# Patient Record
Sex: Male | Born: 1985 | Race: Black or African American | Hispanic: No | Marital: Single | Smoking: Former smoker
Health system: Southern US, Community
[De-identification: ages and names within clinical notes are randomized; demographics above are authoritative.]

## PROBLEM LIST (undated history)

## (undated) DIAGNOSIS — R569 Unspecified convulsions: Secondary | ICD-10-CM

## (undated) DIAGNOSIS — K222 Esophageal obstruction: Principal | ICD-10-CM

## (undated) DIAGNOSIS — Z5189 Encounter for other specified aftercare: Secondary | ICD-10-CM

## (undated) DIAGNOSIS — K219 Gastro-esophageal reflux disease without esophagitis: Secondary | ICD-10-CM

## (undated) DIAGNOSIS — F32A Depression, unspecified: Secondary | ICD-10-CM

## (undated) DIAGNOSIS — K220 Achalasia of cardia: Secondary | ICD-10-CM

## (undated) DIAGNOSIS — F329 Major depressive disorder, single episode, unspecified: Secondary | ICD-10-CM

## (undated) DIAGNOSIS — K3184 Gastroparesis: Secondary | ICD-10-CM

## (undated) DIAGNOSIS — D649 Anemia, unspecified: Secondary | ICD-10-CM

## (undated) HISTORY — DX: Encounter for other specified aftercare: Z51.89

## (undated) HISTORY — DX: Anemia, unspecified: D64.9

## (undated) HISTORY — DX: Achalasia of cardia: K22.0

## (undated) HISTORY — DX: Depression, unspecified: F32.A

## (undated) HISTORY — PX: UPPER GASTROINTESTINAL ENDOSCOPY: SHX188

## (undated) HISTORY — PX: HELLER MYOTOMY: SHX5259

## (undated) HISTORY — DX: Major depressive disorder, single episode, unspecified: F32.9

## (undated) HISTORY — DX: Gastro-esophageal reflux disease without esophagitis: K21.9

## (undated) HISTORY — PX: COLONOSCOPY: SHX174

## (undated) HISTORY — DX: Esophageal obstruction: K22.2

## (undated) HISTORY — DX: Unspecified convulsions: R56.9

---
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
## 2004-05-30 ENCOUNTER — Ambulatory Visit: Payer: Self-pay | Admitting: Internal Medicine

## 2006-03-02 ENCOUNTER — Ambulatory Visit: Payer: Self-pay | Admitting: Internal Medicine

## 2006-03-09 ENCOUNTER — Ambulatory Visit: Payer: Self-pay | Admitting: Internal Medicine

## 2006-03-09 HISTORY — PX: PANENDOSCOPY: SHX2159

## 2006-04-06 ENCOUNTER — Ambulatory Visit: Payer: Self-pay | Admitting: Internal Medicine

## 2006-08-20 ENCOUNTER — Ambulatory Visit: Payer: Self-pay | Admitting: Internal Medicine

## 2006-08-31 ENCOUNTER — Ambulatory Visit (HOSPITAL_COMMUNITY): Admission: RE | Admit: 2006-08-31 | Discharge: 2006-08-31 | Payer: Self-pay | Admitting: Internal Medicine

## 2006-08-31 ENCOUNTER — Encounter: Payer: Self-pay | Admitting: Internal Medicine

## 2006-08-31 HISTORY — PX: OTHER SURGICAL HISTORY: SHX169

## 2006-10-25 ENCOUNTER — Ambulatory Visit: Payer: Self-pay | Admitting: Internal Medicine

## 2006-11-04 ENCOUNTER — Emergency Department (HOSPITAL_COMMUNITY): Admission: EM | Admit: 2006-11-04 | Discharge: 2006-11-05 | Payer: Self-pay | Admitting: Emergency Medicine

## 2006-11-06 ENCOUNTER — Inpatient Hospital Stay (HOSPITAL_COMMUNITY): Admission: EM | Admit: 2006-11-06 | Discharge: 2006-11-09 | Payer: Self-pay | Admitting: Emergency Medicine

## 2006-11-08 ENCOUNTER — Encounter: Payer: Self-pay | Admitting: Internal Medicine

## 2006-11-08 HISTORY — PX: PANENDOSCOPY: SHX2159

## 2006-11-12 ENCOUNTER — Ambulatory Visit: Payer: Self-pay | Admitting: Internal Medicine

## 2007-01-17 ENCOUNTER — Inpatient Hospital Stay (HOSPITAL_COMMUNITY): Admission: AD | Admit: 2007-01-17 | Discharge: 2007-01-27 | Payer: Self-pay | Admitting: Psychiatry

## 2007-01-17 ENCOUNTER — Ambulatory Visit: Payer: Self-pay | Admitting: Psychiatry

## 2007-12-25 ENCOUNTER — Ambulatory Visit: Payer: Self-pay | Admitting: Internal Medicine

## 2007-12-25 DIAGNOSIS — R079 Chest pain, unspecified: Secondary | ICD-10-CM

## 2007-12-25 DIAGNOSIS — R1013 Epigastric pain: Secondary | ICD-10-CM | POA: Insufficient documentation

## 2008-01-03 ENCOUNTER — Ambulatory Visit (HOSPITAL_COMMUNITY): Admission: RE | Admit: 2008-01-03 | Discharge: 2008-01-03 | Payer: Self-pay | Admitting: Internal Medicine

## 2008-01-06 ENCOUNTER — Encounter: Payer: Self-pay | Admitting: Internal Medicine

## 2008-01-23 ENCOUNTER — Telehealth (INDEPENDENT_AMBULATORY_CARE_PROVIDER_SITE_OTHER): Payer: Self-pay

## 2008-01-27 ENCOUNTER — Encounter (INDEPENDENT_AMBULATORY_CARE_PROVIDER_SITE_OTHER): Payer: Self-pay

## 2008-06-29 ENCOUNTER — Telehealth: Payer: Self-pay | Admitting: Internal Medicine

## 2009-05-05 ENCOUNTER — Telehealth: Payer: Self-pay | Admitting: Internal Medicine

## 2009-05-13 ENCOUNTER — Emergency Department (HOSPITAL_COMMUNITY): Admission: EM | Admit: 2009-05-13 | Discharge: 2009-05-13 | Payer: Self-pay | Admitting: Emergency Medicine

## 2010-07-17 ENCOUNTER — Encounter: Payer: Self-pay | Admitting: Internal Medicine

## 2010-09-28 LAB — BASIC METABOLIC PANEL
CO2: 27 mEq/L (ref 19–32)
Calcium: 10.1 mg/dL (ref 8.4–10.5)
Chloride: 106 mEq/L (ref 96–112)
Creatinine, Ser: 1.27 mg/dL (ref 0.4–1.5)
Glucose, Bld: 119 mg/dL — ABNORMAL HIGH (ref 70–99)
Sodium: 141 mEq/L (ref 135–145)

## 2010-11-08 NOTE — H&P (Signed)
NAME:  [name], [name]            ACCOUNT NO.:  [id number]   MEDICAL RECORD NO.:  [id number]          PATIENT TYPE:  INP   LOCATION:  1429                         FACILITY:  [facility]   PHYSICIAN:  Iva Boop, MD,FACGDATE OF BIRTH:  [birth year]   DATE OF ADMISSION:  11/06/2006  DATE OF DISCHARGE:                              HISTORY & PHYSICAL   CHIEF COMPLAINT:  Trouble swallowing, progressive, painful and weight  loss.   HISTORY OF PRESENT ILLNESS:  [name] is a 25 year old African American  male, recently known to Dr. Leone Payor, who has a schizoaffective disorder  and has been undergoing evaluation per Dr. Leone Payor for complaints of  dysphagia.  The patient states that his symptoms date back at least 2  years and have been progressive and particularly worse over the past 6  to 8 months.  He has had about a 25-pound weight loss.  He complains of  difficulty swallowing liquids and solids.  He says liquids are painful  but do go down and solids cause discomfort and seem to sit in his chest  for prolonged periods of time.  He does complain of reflux symptoms and  regurgitation with food coming back up in his mouth, but he does not  actually have any nausea or vomiting and no abdominal pain.  Endoscopy  was done per Dr. Leone Payor in September of 2007 which was a normal exam. A  barium swallow was done in March of 2008 which showed a fixed stricture  versus spasm of the lower esophageal sphincter question of achalasia.  He had been tried on a trial of antispasmodics and Prilosec with minimal  benefit.  He is scheduled to have a manometry on Nov 12, 2006.  He came  to the emergency room on Nov 05, 2006 due to chest pain.  He had workup  done via the ER and was discharged with Hydrocodone to use for pain.  He  says this has been helpful but he has been having increasing difficulty  eating.  His family had called the office and they were advised to bring  him into the emergency room today,  and because of his persistent  symptoms, he is admitted for further workup, hydration, esophageal  manometry and endoscopy.   MEDICATIONS:  1. Prilosec 20 p.o. daily.  2. Thiothixene 5 mg daily.  3. Hydrocodone 5/500 q.6h. p.r.n.   ALLERGIES:  NO KNOWN DRUG ALLERGIES.   PAST HISTORY:  Pertinent for a schizoaffective disorder diagnosed 2003,  otherwise benign.   FAMILY HISTORY:  Negative for GI.   SOCIAL HISTORY:  The patient lives with his parents.  He works part-time  at [employer].  He is a smoker, six to seven cigarettes per day.  No  regular ETOH.   REVIEW OF SYSTEMS:  CARDIOVASCULAR:  Has had chest pain which is  associated with eating, no anginal symptoms.  PULMONARY:  Negative for  cough, shortness of breath.  GU: Negative.  MUSCULOSKELETAL:  Negative.  NEURO:  No current symptoms.  He says he used to have hallucinations and  suicidal ideation which is no longer present.   PHYSICAL  EXAMINATION:  GENERAL APPEARANCE:  On physical exam he is a  well-developed [place] American male in no acute distress.  VITAL SIGNS:  Temperature is 98.3, blood pressure 112/58, pulse is 57.  HEENT: Nontraumatic, normocephalic.  EOMI, PERRLA.  Sclerae anicteric.  NECK:  Neck is supple without nodes.  CARDIOVASCULAR:  Regular rate and rhythm with S1-S2.  No murmur, rub or  gallop.  PULMONARY:  Clear to A and P.  ABDOMEN:  Soft.  Bowel sounds are active.  He is nontender.  There is no  mass or splenomegaly.  RECTAL:  Exam is not done at this time.  NEUROLOGICAL:  The patient has a flat affect but otherwise appropriate  and nonfocal.   IMPRESSION:  19. 25 year old male with progressive dysphagia and odynophagia, weight      loss and chest pain.  Barium swallow most consistent with achalasia      versus other motility disorder.  2. Schizoaffective disorder.   PLAN:  The patient is admitted for IV fluid hydration, an esophageal  manometry and then probable upper endoscopy with Botox  injections versus  surgical referral for correction of the achalasia.  He is admitted to  expedite workup.      Amy Esterwood, PA-C      Iva Boop, MD,FACG  Electronically Signed    AE/MEDQ  D:  11/07/2006  T:  11/07/2006  Job:  [id number]

## 2010-11-08 NOTE — Discharge Summary (Signed)
NAME:  [name], [name]            ACCOUNT NO.:  [id number]   MEDICAL RECORD NO.:  [id number]          PATIENT TYPE:  INP   LOCATION:  1429                         FACILITY:  [facility]   PHYSICIAN:  Iva Boop, MD,FACGDATE OF BIRTH:  February 08, 1986   DATE OF ADMISSION:  11/06/2006  DATE OF DISCHARGE:  11/09/2006                               DISCHARGE SUMMARY   HISTORY OF PRESENT ILLNESS:  The patient is a 25 year old African  American male known to Dr. Leone Payor who has a schizoaffective disorder  and has been undergoing evaluation per Dr. Leone Payor for complaints of  dysphasia.  The patient feels that his symptoms started at least 2 years  previous and have been progressive, particularly over the past 6-8  months.  He has had a subsequent 25 pounds weight loss.  He complains of  difficulty swallowing liquids and solids.  He has some reflux symptoms  and regurgitation with food coming back up in his mouth but thus far  has not actually had any vomiting.  He has no complaints of abdominal  pain but does complain of pain with swallowing food.  Endoscopy was done  in September 2007 with Dr. Leone Payor which was normal.  Barium swallow was  done more recently in March 2008.  This did show a fixed stricture  versus spasm of the LES, question of achalasia.  He had been tried on a  trial of Prilosec and antispasmodics with minimal benefit.  He was set  up to have a manometry on Nov 12, 2006, however, he came to the  emergency room on Nov 05, 2006 due to chest pain and had workup done  which was negative for cardiac issues or cardiopulmonary issues and was  discharged home with hydrocodone to use for his pain and asked to follow  up with Dr. Leone Payor.  He was brought back to the emergency room today  due to the progressive nature of his symptoms and he was admitted for  hydration and further workup with manometry and EGD with possible Botox  for achalasia.   LABORATORY DATA:  Laboratory studies on  admission WBC of 6.0, hemoglobin  14.3, hematocrit of 43.4, MCV of 85, platelets 278,000.  Pro Time 13.7,  INR of 1.0, PTT of 35.  Electrolytes within normal limits, creatinine  1.09, albumin of 4.2.  Liver function studies normal and total bilirubin  was 2.1.  X-ray studies:  None.   HOSPITAL COURSE:  The patient was admitted to the service of Dr. Stan Head and placed on IV fluid hydration, IV PPI, antiemetics as needed  and was scheduled the following morning for a manometry.  However, this  had to be rescheduled  due to lack of availability of staff and was  rescheduled for the following day on the 15th.  This was interpreted per  Dr. Leone Payor and felt to be consistent with achalasia and then later that  morning the patient also underwent upper endoscopy with Botox injection  of the stenotic distal esophagus. He was given 100 units of Botox into  the posterior aspect of the LES in four 25 units aliquots.  The patient  tolerated the procedure well but post procedure did complain of pain.  Within 2 to 3 hours post procedurally he was feeling a bit better and by  later in the afternoon he was able to swallow room temperature liquids  without any discomfort.  We gradually advanced his diet over the next 24  hours to full liquids.  He tolerated this without any difficulty and  says that he feels the food is going down better and that he is not  having any pain.   DISPOSITION:  He is allowed discharge to home on Nov 09, 2006 with  instructions to continue his Prilosec 20 p.o. daily continue Thiothixene  5 mg p.o. daily. Diet is soft.  We will arrange followup at [facility] with Dr. Lorin Picket for surgical treatment of his achalasia.   CONDITION ON DISCHARGE:  Stable and improved.      Amy Esterwood, PA-C      Iva Boop, MD,FACG  Electronically Signed    AE/MEDQ  D:  11/09/2006  T:  11/09/2006  Job:  [id number]   cc:   Dr. Lorin Picket, [facility]   Dr. Clide Deutscher

## 2010-11-08 NOTE — H&P (Signed)
NAME:  [name], [name] NO.:  [id number]   MEDICAL RECORD NO.:  [id number]          PATIENT TYPE:  IPS   LOCATION:  0507                          FACILITY:  [facility]   PHYSICIAN:  Geoffery Lyons, M.D.      DATE OF BIRTH:  10/15/1985   DATE OF ADMISSION:  01/17/2007  DATE OF DISCHARGE:                       PSYCHIATRIC ADMISSION ASSESSMENT   A 25 year old male voluntarily admitted on January 17, 2007.   HISTORY OF PRESENT ILLNESS:  The patient presents with a history of  depression, suicidal thoughts with a plan to overdose.  The patient  states that he has no reason to live.  He feels lonely, dejected has a  lack of support.  He has been having difficulty with relationships.  The  patient states that he relapsed on marijuana about a month ago.  He has  problems concentrating.   PAST PSYCHIATRIC HISTORY:  First admission to [facility] as an  adult.  He sees Dr. Lafayette Dragon for outpatient mental health services.  Also  has a therapist, Dr. Andrey Campanile.  In the past has been on Abilify and  Wellbutrin.   SOCIAL HISTORY:  He is a 25 year old single male who lives his mother.  Works at [employer] at [employer].  He is currently in school at  [employer].   FAMILY HISTORY:  None.   ALCHOHOL AND DRUG HISTORY:  Denies any alcohol use.  Denies any other  drug use.   PRIMARY CARE Tadhg Eskew:  Dr. Leone Payor at [facility].   MEDICAL PROBLEMS:  GERD.   MEDICATIONS:  He has been on Seroquel and Prilosec.   DRUG ALLERGIES:  No known allergies.   PRESENT ILLNESS:  The patient presents with a history of negative  thoughts, anxiety, and a plan to overdose.   PAST HISTORY:  Significant for GERD.   FAMILY HISTORY:  None that he is aware.   MEDICATIONS:  The patient is on Prilosec 20 mg daily, Seroquel 50 mg at  bedtime, Symbyax Duotab 1 b.i.d.   REVIEW OF SYSTEMS:  The patient denies any fever or chills.  Has a  decreased appetite with a 30-pound weight loss.  Reports  problems with  vomiting.  Positive reflex.  Positive for depression.  No headaches.  No  muscle weakness.  No seizures.   Temperature 98.3, 62 heart rate, 60 respirations, blood pressure 117/81,  5 feet 7-1/2 inches tall, 143 pounds.  This is a well-nourished male in no acute distress.  Negative lymphadenopathy.  Head is atraumatic.  Hair is evenly  distributed.  Trachea is midline.  CHEST:  Clear.  No wheezing.  BREAST EXAM:  Deferred.  HEART:  Regular rate and rhythm.  No murmurs or gallops.  ABDOMEN:  A soft, nontender abdomen.  PELVIC AND GU EXAM:  Deferred.  EXTREMITIES:  Moves all extremities.  No clubbing, no edema, and 5+  against resistance.  SKIN:  Warm and dry without rashes or lacerations.  Has a tattoo noted.  NEUROLOGICAL:  Findings are intact, nonfocal.  Easily performs heel-to-  shin.  Gait is steady.  Normal alternating movements intact.   Urine  drug screen is negative.  Glucose 112.  Urinalysis is negative.  RDW is 14.1.   MENTAL STATUS EXAM:  He is alert, cooperative, reserved.  Down-cast  eyes.  Casually dressed.  Speech is soft-spoken.  Mood is depressed.  The patient's affect is restricted.  Thought processes endorsing some  paranoid ideation.  He feels someone is watching him with a camera.  Positive for suicidal thoughts.  Negative for homicidal ideation.  Cognitive function intact.  Memory is good.  Judgment and insight is  fair.  Concentration is intact.  He appears sincere.   AXIS I:  Schizoaffective disorder.  Cannabis abuse.  AXIS II:  Deferred.  AXIS III:  Deferred.  AXIS IV:  Other psychosocial problems.  AXIS V:  Current is 30.   PLAN:  Contract for safety.  Stabilize mood and thinking.  We will  clarify medications and contact Dr. Lafayette Dragon for any insight and  recommendations for further medications.  We will initiate Wellbutrin.  He is advised.  The patient to follow-up with his individual therapy,  and continue to be medication compliant.  We  will also discuss his use  of cannabis.  Tentative length of stay is 3-4 days.      Landry Corporal, N.P.      Geoffery Lyons, M.D.  Electronically Signed    JO/MEDQ  D:  01/21/2007  T:  01/21/2007  Job:  [id number]

## 2010-11-08 NOTE — Assessment & Plan Note (Signed)
[facility]                         GASTROENTEROLOGY OFFICE NOTE   NAME:[name], [name]                     MRN:          [id number]  DATE:10/25/2006                            DOB:          03/19/1986    PROBLEMS:  1. Dysphagia. Barium swallow suggestive of achalasia, question related      to Abilify. Abilify was held about two months ago.  2. Depression.  3. Psychiatric disorder, ? schizophrenia  4. EGD March 09, 2006-normal as part of an evaluation for      epigastric pain.   MEDICATIONS:  1. Levbid 0.375 mg twice daily.  2. Thiothixene 5 mg q nightly.   INTERVAL HISTORY:  [name] is still having swallowing problems despite  stopping the Abilify. He is regurgitating, he is losing weight. His  proton pump inhibitor did not help so he stopped that. He is here with  his mom today. He has tried Seroquel, but is now on thiothixene at  bedtime for his psychiatric issues. His weight is 155 pounds. He was 160  pounds in September of 2007. He has fluctuated over time.   PHYSICAL EXAMINATION:  Pulse 68, blood pressure 100/60.   ASSESSMENT:  It looks like he must have achalasia. I thought the Abilify  might be the cause given the normal endoscopy last year. I have  discussed this with the patient and his mom.   PLAN:  1. Esophageal manometry to confirm the diagnosis.  2. Upper GI endoscopy with Botox injection likely, depending on the      results of the manometry.  3. Pending that, I think a tertiary referral for more definitive      evaluation and treatment will be needed as we cannot go beyond      Botox here in [place].  4. He may able to restart the Abilify depending on the clinical      course.     Alexander Boop, MD,FACG  Electronically Signed    CEG/MedQ  DD: 10/25/2006  DT: 10/25/2006  Job #: [id number]   cc:   Alexander Spiro, MD

## 2010-11-08 NOTE — Assessment & Plan Note (Signed)
[facility]                                 ON-CALL NOTE   NAME:[name], [name]                     MRN:          [id number]  DATE:11/04/2006                            DOB:          [birth year]    [name] called stating that he is having abdominal pain. The pain  is post prandial. He is under Dr. Marvell Fuller care and was prescribed  Hyoscyamine. While he was instructed to take it twice a day he has been  taking if as needed. He took one today and still has pain.   I instructed [name] to buy some over-the-counter Prilosec and to  continue his Hyoscyamine twice a day. If the pain continues, he was  instructed to call Dr. Marvell Fuller office in the morning.     Barbette Hair. Arlyce Dice, MD,FACG  Electronically Signed    RDK/MedQ  DD: 11/04/2006  DT: 11/05/2006  Job #: [id number]   cc:   Iva Boop, MD,FACG

## 2010-11-11 NOTE — Assessment & Plan Note (Signed)
[facility]                         GASTROENTEROLOGY OFFICE NOTE   NAME:[name], Swing                     MRN:          [id number]  DATE:08/20/2006                            DOB:          Oct 11, 1985    CHIEF COMPLAINT:  Painful swallowing.   [name] called the office and has been complaining of odynophagia and a  feeling like his throat is closing. He had not been using Prilosec, and  the nurses recommended he restart that as well as use his Levbid every  day. He has been doing that for a few days and feels about the same.  Levbid was used intermittently which did help previous epigastric pain.  He had another complaint of having to wake up at night and having a  watery stool, though he has formed bowel movements during the day. His  weight is overall down from 2005 but up from 2007. He is 165 pounds.   His medications include Abilify, trazodone, Prilosec and Levbid 0.375 mg  twice a day.   He has no known drug allergies.   Previous upper endoscopy was unrevealing as a workup for epigastric pain  as were CBC, CMET, and ultrasound. He does have a slightly low white  blood cell count of unclear significance but not thought related to his  pain.   PHYSICAL EXAMINATION:  Shows him to be in no acute distress. Weight as  described above. Height 5 foot 11 inches. Pulse 72. Blood pressure  122/78.  The abdomen is soft, nontender with organomegaly or mass.  The chest wall was nontender.   ASSESSMENT:  1. Dysphagia and odynophagia. Question etiology. He has not been on      other medications. Abilify can cause dysphagia-type problems,      question related to that, though I am not sure why that would start      now as he has been it previously.  2. Nocturnal diarrhea. I am suspicious of irritable bowel phenomenon      most likely.   PLAN:  1. Continue current medications. I have sent a prescription for      Prilosec OTC 20 mg daily (Medicaid  will pay for that), and he can      get that filled, and as well as a prescription to take loperamide 2      mg at bedtime to try to help check the diarrhea.  2. Barium swallow with tablet to look for any obvious dysmotility      issue.  3. Further plans pending the above.     Iva Boop, MD,FACG  Electronically Signed    CEG/MedQ  DD: 08/20/2006  DT: 08/21/2006  Job #: [id number]   cc:   Iva Boop, MD,FACG

## 2010-11-11 NOTE — Discharge Summary (Signed)
NAMEMarland Kitchen  [name], [name]            ACCOUNT NO.:  [id number]   MEDICAL RECORD NO.:  [id number]          PATIENT TYPE:  IPS   LOCATION:  0508                          [facility]ILITY[facility]  [facility]   PHYSICIAN:  Geoffery Lyons, M.D.      DATE OF BIRTH:  [birth year]   DATE OF ADMISSION:  01/17/2007  DATE OF DISCHARGE:  01/27/2007                               DISCHARGE SUMMARY   CHIEF COMPLAINT AND PRESENT ILLNESS:  This was the first admission to  [facility] for this 25 year old male voluntarily  admitted.  History of depression, suicidal thoughts with a plan to  overdose.  Endorsed he had no reason to live.  Feeling lonely,  dejected, has a lack of support.  Had been having difficulty with  relationships.  He relapsed on marijuana about a month prior to this  admission.  Has chronic problems concentrating.   PAST PSYCHIATRIC HISTORY:  First time at [facility].  Sees Dr.  Evelene Croon and sees Dr. Ollen Gross for psychotherapy.  Has been on Abilify  and Wellbutrin.   ALCOHOL/DRUG HISTORY:  Denies active use of alcohol.  Endorsed some use  of marijuana.   MEDICAL HISTORY:  Gastroesophageal reflux.   MEDICATIONS:  Has been on Seroquel and Prilosec.   PHYSICAL EXAMINATION:  Performed and failed to show any acute findings.   LABORATORY DATA:  CBC revealed white blood cells 4.3, hemoglobin 13.7.  Sodium 139, potassium 4.2, glucose 112, BUN 8, creatinine 1.11.  Drug  screen negative for substances of abuse.  SGOT 15, SGOT 16.   MENTAL STATUS EXAM:  Alert male, somewhat reserved, somewhat guarded,  not as spontaneous, downcast eyes.  Casually dressed.  Speech is soft  spoken, hardly audible.  Mood is depressed.  Affect is constricted.  Thought process endorsed some paranoid ideations, feeling that someone  was watching him with a camera.  Endorsed suicidal thoughts, feeling  overwhelmed, negative self-perceptions.  No homicidal ideas.  No  hallucinations.  Cognition well-preserved.   ADMISSION DIAGNOSES:  AXIS I:  Schizoaffective disorder.  AXIS II:  No diagnosis.  AXIS III:  No diagnosis.  AXIS IV:  Moderate.  AXIS V:  GAF upon admission 30; highest GAF in the last year 65.   HOSPITAL COURSE:  He was admitted.  He was started in individual and  group psychotherapy.  He was given some Seroquel initially 50 mg and it  was increased up to 400 mg.  Endorsed being depressed since 2003,  diagnosed schizoaffective.  Seeing Dr. Evelene Croon and Dr. Andrey Campanile, started  seeing her as an adolescent.  Had been on Seroquel.  Suicidal thoughts  for two weeks.  Started smoking weed two weeks ago, got very depressed,  saw Dr. Evelene Croon the day before.  Working two days at [employer], going also  to [employer]  Did not do too well in class.  Endorsed acid reflux, Prilosec,  helps cramping.  Frustrated after he leaves school and feels he does not  learn.  Endorsed difficulty with relationship, feeling lonely, poor  communication.  Endorsed he thinks he got to smoke marijuana again and  quit.  He thinks once he quits he gets really depressed, once he gets  this down, starts thinking about suicide.  Does say he took all his  pills and was planning to take an overdose but he changed his mind.  Endorsed depressed mood.  No energy, no motivation, overwhelmed with the  way he was feeling.  Issues of self-esteem, self-image, very distorted,  endorsed paranoia.  Endorsed he did better on the Wellbutrin.  On January 21, 2007, endorsed that he had been having some issues in trying to  decide if he was gay or not.  Endorsed the attraction towards females,  had had some but his relationships do not last and then endorsed he had  had some same-sex relationships when he was a teenager but there were  none satisfactory, they were more experimental.  He does endorse hear  voices that the voice might have called him gay before.  We worked on  Dance movement psychotherapist, in terms of sexual orientation.  We went ahead and  ordered  Abilify.  There was a lot of ideas of reference, even the way he  came to the conclusion that he was gay, the voices were telling him, he  looked __________  and he decided that that was what he was and he was  homosexual.  The Abilify was discontinued as it caused some nausea and  worsening of gastroesophageal reflux.  He was placed on Risperdal.  Had  some similar episodes with Abilify in the past.  By January 22, 2007, he  was endorsing auditory hallucination.  There was evidence of ideas of  reference, paranoia and suicidal ideas, did not want to live like this  anymore, questioning his sexual identity due to the voices.  We  discontinued the Abilify and started the Risperdal.  He was still having  paranoia, ideas of reference but he was tolerating the Risperdal better.  A sense of hopelessness and helplessness.  He required a lot of support  and trying to challenge his distortions and it seemed that eventually he  started accepting the fact that he was probably not homosexual.  On  January 25, 2007, still ideas of reference but the voices had muffled.  The Risperdal he was tolerating well.  We went up to 0.5 mg twice a day.  Mostly to himself but we were getting a little bit more of affect,  increased interaction, less frightened.  There was some nausea and  vomiting going on.  He was going to have a procedure but there they were  going to distend his esophagus and they might have to cut and he had  that scheduled on the Monday.  On January 25, 2007, he was able to state  that he knew he was heterosexual.  More relaxed, usually very tense,  anxious and restless but markedly improved, improved reality testing.  On January 27, 2007, he was objectively better.  He was going to have the  GI surgery the next day for what he was discharged.  We decided not to  change any of the medications until after the surgery to see what sort  of impact the surgery has on his symptoms.   DISCHARGE DIAGNOSES:   AXIS I:  Schizoaffective disorder.  AXIS II:  No diagnosis.  AXIS III:  Gastroesophageal reflux.  AXIS IV:  Moderate.  AXIS V:  GAF upon discharge 50.   DISCHARGE MEDICATIONS:  1. Wellbutrin XL 300 mg per day.  2. Seroquel 300 mg at bedtime.  3. Protonix 40 mg twice a day.  4. Risperdal 0.5 mg 1 three times a day.  5. __________  1 twice a day.   FOLLOWUP:  Follow Dr. Evelene Croon and Dr. Andrey Campanile.      Geoffery Lyons, M.D.  Electronically Signed     IL/MEDQ  D:  02/11/2007  T:  02/12/2007  Job:  [id number]

## 2010-11-11 NOTE — H&P (Signed)
[facility]  Patient:    KYRILLOS, ADAMS                     MRN: [id number] Adm. Date:  11/24/99 Attending:  Carolanne Grumbling, M.D.                   Psychiatric Admission Assessment  DATE OF ADMISSION:  Nov 24, 1999  CHIEF COMPLAINT:  Dwight was admitted to the hospital because he seemed to have been profoundly depressed, to the point where he was not going to school, not going out, being withdrawn at home, being withdrawn from his own family, sleeping excessively, having psychomotor retardation, and barely talking.  PATIENT IDENTIFICATION:  Alessio is a 25 year old old male.  HISTORY OF PRESENT ILLNESS:  His mother reports that Mehkai has been declining over the past several months.  It appeared to be depression, but when she visited her family recently they were concerned that he might be hearing voices, consequently she took him back to the doctor today.  He seemed to be possibly hearing voices and was sent here for an evaluation and subsequently admitted.  He has been on Prozac for a couple of months and that has not made any difference according to him, and to his mother.  He seems to be more withdrawn over time.  He specifically denied any hallucinations, though people watching him after he came in to the hospital indicated he appeared to be having them.  At times, he would cover his ears as if he were trying to block things out.  He tends not to look at anyone directly.  He is usually staring down and puts his head down as if he is listening to something else, not with his head on the desk but just leaning down as if he is listening to other things.  FAMILY, SCHOOL AND SOCIAL ISSUES:  He lives with his mother.  The relationship is good.  He is reportedly close to his family.  School has generally gone well until recently.  He made good grades, but recently he has had difficulty even getting to school, much less focusing and doing his work.   He has had friends in the past, but recently has been withdrawn and staying in the house. He reports that he has been smoking pot every day.  His mother questions that and wonders where he is getting it or how he gets to it.  He also smokes cigarettes at times, that he says helps him, and she questions how much he is really doing.  PAST PSYCHIATRIC HISTORY:  He has been in outpatient therapy with Dwan Bolt and Dr. Sharl Ma, has been taking Prozac for two months.  DRUG, ALCOHOL AND LEGAL ISSUES:  He denied any legal problems.  He does smoke pot daily, he says.  He also smokes cigarettes, five or six per day.  MEDICAL PROBLEMS, ALLERGIES, MEDICATIONS:  He denied any medical problems.  He has no known allergies.  He is currently taking Prozac.  MENTAL STATUS EXAMINATION:  At the time of the initial evaluation revealed an alert, oriented young man.  His affect was flat.  He made no eye contact.  He had psychomotor retardation.  He denied hallucinations.  His thoughts seemed logical when he talked.  He did not produce any spontaneous speech and answered very briefly the questions that were asked to him.  He was very vague in his explanations.  For example, he believed he was having  these problems he said because he was too young to smoke pot.  Short and long term memory appeared intact based on his response to questions about his recent and remote history.  Insight was lacking.  Intellectual functioning seemed at least average.  Concentration was adequate for a one-to-one interview.  ADMISSION DIAGNOSES: Axis I:    1. Psychotic disorder not otherwise specified.            2. Depressive disorder not otherwise specified.            3. Rule out major depressive disorder, with psychotic features. Axis II:   Deferred. Axis III:  Healthy. Axis IV:   Severe. Axis V:    30/65.  ASSETS AND STRENGTHS:  Lanier has a supportive mother.  He has been a good Consulting civil engineer.  He seems to want  help.  INITIAL PLAN OF CARE:  The estimated length of hospitalization is three to five days.  The plan is to stabilize to the point of no apparent psychosis and no suicidal thoughts.  Medication will be used.  At this point the Prozac will be continued and Risperdal will be used to see if it helps with the psychotic process. CONDITIONS NECESSARY FOR DISCHARGE:  POST HOSPITAL CARE PLAN: DD:  11/24/99 TD:  11/26/99 Job: [id number]

## 2010-11-11 NOTE — Assessment & Plan Note (Signed)
[facility]                         GASTROENTEROLOGY OFFICE NOTE   NAME:[name], [name]                     MRN:          [id number]  DATE:09/06/2006                            DOB:          07/27/1985    [name] had a barium esophagram which looked like achalasia.  He is on  Abilify, and I think that may be the cause.  I discussed this with his  psychiatrist, Dr. Evelene Croon.  She has okayed that he stop that and follow up  with her in a few weeks.  I will have a followup with him in about a  month, to see if this if helps his swallowing problems, i.e. removing  this medication.  If not, further workup with monometry will likely be  indicated.     Iva Boop, MD,FACG  Electronically Signed    CEG/MedQ  DD: 09/06/2006  DT: 09/08/2006  Job #: [id number]   cc:   Page Spiro, M.D.

## 2010-11-11 NOTE — Discharge Summary (Signed)
[facility]  Patient:    [name], [name]                     MRN: [id number] Adm. Date:  [id number] Disc. Date: [id number] Attending:  Benjaman Pott                           Discharge Summary  [name] was a 25 year old male.  INITIAL ASSESSMENT AND DIAGNOSIS:  [name] was admitted to the hospital because he had been depressed to the point where he was not going to school, not going out, being withdrawn at home, being withdrawn from his own family, sleeping excessively, and having psychomotor retardation and barely talking. His mother reported that he had been declining over the past several months. It appeared to be depression.  She said when she went to see her family recently they were quite concerned about him and told her she needed to get some help for him.  He has been on Prozac for a couple of months and that has not made any difference.  He seemed to be more withdrawn rather than better. He specifically denied any hallucinations, though people watching after him indicated they thought he was hearing voices.  He would do things such as cover his ears as if he was trying to block things out.  He would not make eye contact with anyone.  He would put his head down and not look at you when he was talking to you and would barely answer questions.  MENTAL STATUS EXAMINATION:  At the time of initial evaluation revealed an alert, oriented man.  Affect was flat.  He made no eye contact.  He had psychomotor retardation.  He denied hallucinations.  When he did talk, his thoughts were logical.  He did not produce any spontaneous speech and answered questions only very briefly or not at all.  He was very vague in explanations for what was going on in his life.  He did not want to answer any questions about personal relationships.  He blamed his current issues on saying that he was "too young to smoke pot."  Short and long-term memory were hard to  judge because of his difficulty in answering questions.  Insight was lacking. Intellectual functioning seemed at least average.  Concentration was adequate for one to one interview.  Other pertinent history can be obtained from the psychosocial service summary.  PHYSICAL EXAMINATION:  Within normal limits.  ADMITTING DIAGNOSES: Axis I:    1. Psychotic disorder not otherwise specified.            2. Depressive disorder not otherwise specified.            3. Rule out major depressive disorder with psychotic features. Axis II:   Deferred. Axis III:  Healthy. Axis IV:   Severe. Axis V:    30/65.  FINDINGS: All indicated laboratory examinations were within normal limits or noncontributory.  HOSPITAL COURSE:  While in the hospital, [name] made only minimal changes. He by the time of discharge was at least making some eye contact with people. He talked with his peers more extensively and more appropriately than with staff members, but even that was not that much.  In talking more with his mother, it seemed that he really had a personality similar to what he has now although it was more exaggerated.  He says he was smoking pot daily.  He seems to enjoy  smoking pot.  Its hard to know, again, what he is really doing, but his mother believes that he probably has been doing pot.  His urine drug screen was positive for marijuana.  He attributed all of his problems to being too young to smoke pot, but at times would say that he would stop and at other times seemed to indicate that he would like to continue smoking it because at the same time it made him feel good.  He denied any suicidal thoughts in spite of all the other apparent depression, and by the end of the stay in the hospital much of what appeared to be depression seemed more a personality style and perhaps a psychotic state.  However, he denied any symptoms of psychosis, any hallucinations, delusions, paranoid thoughts, ideas  of reference and so forth.  The impression was simply because of his withdrawal his covering his head with his hands, his sitting in a posture for long periods of time irrelevant as to where he was, his need to avoid the groups and peers, his occasional looking to the side as if he is hearing something else.  Consequently, he was started on Risperdal which was increased to 2 mg twice a day.  The Prozac was changed to Celexa simply because he reported the Prozac had not been that helpful.  He had a family session with his mother. He was denying any suicidal thoughts or threats towards others, and was discharged to the intensive outpatient program.  In the intensive outpatient program, he was a little bit more animated than he had been before.  He at least made some eye contact.  He would talk in sentences rather than one word statements.  He still seemed not interested in things around him.  He wanted to stay isolated in his house or hang out with his friends who were smoking pot.  He was still secretive about what he was really doing with himself all day.  His mother wanted him to to to a summer camp and that is likely where he will go.  It is debatable as to how well he will do there.  He says he will go and he will try to make it work.  By the time of discharge from the IOP it was still uncertain as to what really was going on with him.  I maintained a diagnosis of major depression with psychotic thoughts, but it very well may be that he has a personality disorder with a schizotypal personality or something similar, or just an early psychosis that presents as depression.  He does not particularly feel depressed.  He just is withdrawn and isolated.  POST HOSPITAL CARE PLANS:  He was referred for outpatient with Dr. Evelene Croon.  At the time of discharge he was continuing to take Risperdal 4 mg at bedtime and Celexa 20 mg daily.  There were no restrictions placed on his diet or  his activity.  FINAL DIAGNOSIS: Axis I:    1. Major depression, single episode, severe, with psychotic               features.            2. Rule out psychotic disorder not otherwise specified. Axis II:   Rule out schizotypal personality disorder.  Axis III:  Healthy. Axis IV:   Severe. Axis V:    50. DD:  12/23/99 TD:  12/23/99 Job: [id number]

## 2010-11-11 NOTE — H&P (Signed)
NAMEMarland Kitchen  [name], [name]                        ACCOUNT NO.:  [id number]   MEDICAL RECORD NO.:  [id number]                   PATIENT TYPE:  IPS   LOCATION:  0204                                 [facility]ILITY[facility]   PHYSICIAN:  Rolan Lipa. Ladona Ridgel, M.D.              DATE OF BIRTH:  1986-04-06   DATE OF ADMISSION:  01/25/2002  DATE OF DISCHARGE:                         PSYCHIATRIC ADMISSION ASSESSMENT   IDENTIFICATION:  The patient is an 25 year old male.   CHIEF COMPLAINT:  The patient was admitted to the hospital after taking an  overdose of Effexor and Seroquel three days prior to admission.  He  apparently was brought to the emergency room this morning, or late  yesterday, and, because of the overdose and his continued depression, was  admitted to the hospital.   HISTORY OF PRESENT ILLNESS:  The patient says he gets upset when people  tease him.  His brother, particularly, teases him.  For example, he asks him  why he is so ugly and he will not stop.  He says his mother, at times, also  teases him but the brother makes him madder than the mother.  Consequently,  he got so mad, he took an overdose.  He says he is sensitive about being  teased.  Otherwise, he likes his brother and likes his mother.  At the  moment, he says he is not suicidal but he is still depressed.   FAMILY/SCHOOL/SOCIAL ISSUES:  He lives with his mother and 40 year old  brother.  He will be a senior in high school this coming year.  He says he  does okay in school.  He has friends.  Summer was fairly boring.  He has no  job.  He has no car.  So he has been at home.  He has no girlfriend.  He  does have a 66 year old sister, he said, who lives out of the home and she  is not a problem.  His mother and father are separated.  He does get to live  with his father and he likes both of his parents and gets along with them.  He denied any history of sexual abuse or physical abuse.  He said he has had  some encounters with  males who seem to be interested in having sexual  contact with him but he has gotten away from them and has ignored them  since.  He has no personal interest in any contact with them, he says.   PREVIOUS PSYCHIATRIC TREATMENT:  He was a patient at [facility]  two years ago, when he was 14.  He has been seen by Dr. Evelene Croon and Dr. Andrey Campanile  for his outpatient psychiatrist and therapist.   MEDICAL PROBLEMS/ALLERGIES/MEDICATIONS:  He reported no medical problems.  No known allergies.  He is currently taking Effexor and Seroquel.   DRUG/ALCOHOL/LEGAL ISSUES:  He said he used to smoke pot but he stopped a  year  ago when one of his friends told him he needed to.  He says still  sometimes he has the urge to smoke pot but he does not because he does not  think it is good for him, even though it does help him feel better.  He does  smoke one cigarette a day.  He does not drink alcohol or use any other  substances.  He said he did have trouble with the police because he took a  car without permission and apparently kept it.  His father helped him work  out that situation.   MENTAL STATUS EXAM:  At the time of the initial evaluation revealed an  alert, oriented, young man, who was cooperative.  He was appropriately  groomed and dressed for the situation.  He admitted to suicidal ideation and  an attempt.  He currently does not have any suicidal ideation but he is  still depressed.  He says he is sensitive to people calling him names or  criticizing him.  His intelligence seemed to be intact but, with some of the  answers and some of the judgment he has exhibited, there is some question  about learning disability or intellectual functioning.  There was no  evidence of any psychotic thinking or behavior.  Short and long-term memory  were intact.  Judgment currently seemed adequate.  Insight is minimal.   ASSETS:  The patient is cooperative.   ADMISSION DIAGNOSES:   AXIS I:  Depressive  disorder not otherwise specified.   AXIS II:  Deferred.   AXIS III:  Healthy.   AXIS IV:  Mild.   AXIS V:  55/65.   ESTIMATED LENGTH OF STAY:  Five to seven days.   PLAN:  Continue his medications and stabilize to the point where he has a  plan for dealing with his stress without making suicidal threats or  attempts.                                                 Rolan Lipa. Ladona Ridgel, M.D.    GDT/MEDQ  D:  01/25/2002  T:  01/26/2002  Job:  [id number]

## 2010-11-11 NOTE — Assessment & Plan Note (Signed)
[facility]                           GASTROENTEROLOGY OFFICE NOTE   NAME:WILLIAMSMarquail, Bradwell                     MRN:          [id number]  DATE:03/02/2006                            DOB:          11-Oct-1985    CHIEF COMPLAINT:  Epigastric pain.   ASSESSMENT:  Recurrent epigastric pain. Similar to what I saw him for in  2005. At that time things seemed to respond to antacids. There is incomplete  relief. He has been given a trial of Prilosec without relief at this time.   RECOMMENDATIONS AND PLAN:  1. Esophagogastroduodenoscopy and laboratory assessment.  2. If this is unrevealing an abdominal ultrasound might be indicated.  3. I have explained the risks, benefits, and indications for upper GI      endoscopy. He understands and agrees to proceed.   HISTORY:  This is a pleasant 25 year old African-American man that describes  intermittent epigastric pain radiating from the chest. There might be some  heart burn at times. Tums, Pepto Bismol and Prilosec have not provided  relief.  I think he has taken the Prilosec for at least a couple of weeks  from the note Dr. Bruna Potter sent.  He has not lost weight. There are no other  constitutional symptoms of fever, chills, fluctuation in weight. There is no  skin rash. He does have chronic depression. He is still seeing a counselor  though his antidepressants have been stopped. He is on no medications at  this time and there are no known drug allergies.   PAST MEDICAL HISTORY:  Depression.   FAMILY HISTORY:  Positive for diabetes.   SOCIAL HISTORY:  He goes to school at [employer] and is employed at the [employer].  No tobacco or drug use. He is here with his mother today. He lives  with her.   PHYSICAL EXAMINATION:  GENERAL:  Exam reveals a well-developed, well-  nourished young black man in no acute distress.  VITAL SIGNS:  Weight 160 pounds, pulse 60, blood pressure 114/66.  HEENT:  The eyes are anicteric.  LUNGS:  Clear.  HEART:  S1, S2, no murmurs, rubs, or gallops.  ABDOMEN:  Soft and nontender without organomegaly or mass. There is no  hernia.  SKIN:  Inspection and palpation of the skin in the trunk reveals no  abnormalities.  PSYCHE:  He has somewhat of a flat affect. He is alert and oriented x3.   NOTE:  His weight is down about 16 pounds from 2005. That may be from coming  off the Wellbutrin but certainly amplifies the need to investigate an EGD.                                   Iva Boop, MD,FACG   CEG/MedQ  DD:  03/03/2006  DT:  03/03/2006  Job #:  [id number]

## 2010-11-11 NOTE — Discharge Summary (Signed)
NAMEMarland Kitchen  JRU, PENSE                        ACCOUNT NO.:  [id number]   MEDICAL RECORD NO.:  [id number]                   PATIENT TYPE:  IPS   LOCATION:  0204                                 [facility]ILITY[facility]  [facility]   PHYSICIAN:  Cindie Crumbly, MD                 DATE OF BIRTH:  07-13-1985   DATE OF ADMISSION:  01/25/2002  DATE OF DISCHARGE:  02/11/2002                                 DISCHARGE SUMMARY   REASON FOR ADMISSION:  This 25 year old African-American male was admitted  for increasing symptoms of psychosis.  For further history of present  illness, please see the patient's psychiatric admission assessment.   PHYSICAL EXAMINATION:  At the time of admission was entirely unremarkable.   LABORATORY DATA:  The patient underwent a laboratory workup to rule out any  medical problems contributing to his symptomatology.  RPR was nonreactive.  Urine probe for gonorrhea and chlamydia were negative.  GGT was within  normal limits.  TSH and free T4 were within normal limits.  UA was  unremarkable.  Basic metabolic panel was within normal limits.  UA showed  100 mg/dl of protein on dipstick and was otherwise unremarkable.  A urine  drug screen was negative.  Hepatic panel was within normal limits.  The  patient received no x-rays, no special procedures, no additional  consultations.  He sustained no complications during the course of this  hospitalization.   HOSPITAL COURSE:  On admission, the patient presented as depressed and  psychomotor retarded.  He then began admitting to auditory hallucinations  that have been present since he was 25 years of age and have been increasing  significantly.  His mother reported decreased school performance over the  past year and the patient stated that this was predominantly because he was  being distracted by the auditory hallucinations.  He denied any visual  hallucinations.  He complained of being frightened by the auditory  hallucinations that were  hostile and menacing to him.  His thoughts were  highly disorganized.  He was responding to internal stimuli.  His affect was  flat.  Mood was blunted and initially depressed and anxious.  He was  somewhat tremulous, considerably apathetic, withdrawn, agitated.  He was  begun on a trial of Risperdal and titrated up to 9 mg.  He became  increasingly agitated and potentially toxic.  He was continued on Effexor XR  as he initially came in complaining of depression but, after he was placed  on antipsychotic medication, denied any symptoms of depression and appeared  to be getting somewhat worse and more toxic from the Effexor.  Effexor was  titrated downward and discontinued.  While he showed considerable  improvement in his psychotic symptoms on Risperdal alone, his symptoms  remained significant.  His thought processes remained disorganized and he  continued to respond to auditory hallucinations which were disturbing to  him.  He was placed on  a trial of Abilify and titrated up to a dose of 20 mg  per day.  He tolerated this well without side effects.  On the combination  of Risperdal and Abilify, he continued to complain of insomnia.  Seroquel  was added to this and titrated up to 200 mg per day.  At the time of  discharge, he is tolerating all his medications without any side effects.  He denies any suicidal or homicidal ideation.  He denies any auditory  hallucinations.  His thoughts are more organized.  He continues to show a  somewhat flat and blunted affect and mood.  His anxiety level has decreased  considerably.  He is no longer showing any agitation.  His thoughts are much  less disorganized and are now more goal directed.  He is able to perform all  of his activities of daily living, no longer appears to be a danger to  himself or others.  He remained somewhat apathetic and isolative and  withdrawn.  As he no longer appears to be a danger to himself or others, it  is felt that he  may be transitioned to outpatient therapy for continuing  medication management.   CONDITION ON DISCHARGE:  Improved.   DIAGNOSES (ACCORDING TO DSM-IV):    AXIS I:  1. Schizophreniform disorder.  2. Rule out schizoaffective disorder.   AXIS II:  1. Rule out schizoid personality disorder.  2. Rule out schizotypal personality disorder.   AXIS III:  None.   AXIS IV:  Current psychosocial stressors are severe.   AXIS V:  20 on admission; 30 on discharge.   FURTHER EVALUATION AND TREATMENT RECOMMENDATIONS:  1. The patient is discharged to home.  2. He is discharged on an unrestricted level of activity and a regular diet.  3. He is discharged on Risperdal 6 mg p.o. q.h.s., Seroquel 200 mg p.o.     q.h.s., Abilify 20 mg p.o. q.h.s., Cogentin 1 mg p.o. q.h.s.  4. He will follow up with Dr. Evelene Croon, his outpatient psychiatrist, for all     further aspects of his psychiatric care and, consequently, I will sign     off on the case at this time.  He will follow up with his primary care     physician for all further aspects of his medical care and to repeat his     UA, which showed an elevated protein on dipstick as noted above.                                               Cindie Crumbly, MD    TS/MEDQ  D:  02/11/2002  T:  02/13/2002  Job:  [id number]

## 2011-04-10 LAB — DIFFERENTIAL
Basophils Absolute: 0
Basophils Relative: 0
Eosinophils Absolute: 0
Eosinophils Relative: 1
Lymphocytes Relative: 33
Lymphs Abs: 1.4
Monocytes Absolute: 0.4
Monocytes Relative: 9
Neutro Abs: 2.5
Neutrophils Relative %: 58

## 2011-04-10 LAB — URINALYSIS, ROUTINE W REFLEX MICROSCOPIC
Bilirubin Urine: NEGATIVE
Glucose, UA: NEGATIVE
Hgb urine dipstick: NEGATIVE
Ketones, ur: NEGATIVE
Nitrite: NEGATIVE
Protein, ur: NEGATIVE
Specific Gravity, Urine: 1.022
Urobilinogen, UA: 0.2
pH: 6

## 2011-04-10 LAB — DRUGS OF ABUSE SCREEN W/O ALC, ROUTINE URINE
Amphetamine Screen, Ur: NEGATIVE
Benzodiazepines.: NEGATIVE
Creatinine,U: 238
Marijuana Metabolite: NEGATIVE
Methadone: NEGATIVE
Opiate Screen, Urine: NEGATIVE
Propoxyphene: NEGATIVE

## 2011-04-10 LAB — COMPREHENSIVE METABOLIC PANEL WITH GFR
ALT: 16
AST: 15
Albumin: 4
Alkaline Phosphatase: 49
BUN: 8
CO2: 31
Calcium: 9.3
Chloride: 103
Creatinine, Ser: 1.11
GFR calc non Af Amer: >60
Glucose, Bld: 112 — ABNORMAL HIGH
Potassium: 4.2
Sodium: 139
Total Bilirubin: 1.4 — ABNORMAL HIGH
Total Protein: 6.9

## 2011-04-10 LAB — CBC
HCT: 41
Hemoglobin: 13.7
MCHC: 33.4
MCV: 82.9
Platelets: 266
RBC: 4.95
RDW: 14.1 — ABNORMAL HIGH
WBC: 4.3

## 2011-08-07 ENCOUNTER — Telehealth: Payer: Self-pay | Admitting: Internal Medicine

## 2011-08-07 NOTE — Telephone Encounter (Signed)
Ok

## 2011-08-07 NOTE — Telephone Encounter (Signed)
Patient seen in 2010 for achalasia he has a history of Hellers myotomy in 08.  He has had 2-3 months of vomiting after meals.  His symptoms are worsening.  He is offered an appt to come in and see Dr Leone Payor but he declines at this time.  He is asking if he can try something OTC.  He has not been on a PPI.  He was taking prilosec in 2010.  I have asked him to try Prilosec OTC and if his symptoms don't improve after a week -10 days he needs to call back and schedule an office visit.  He is asked to avoid greasy, fatty foods, and remain on a soft bland diet.

## 2011-09-04 ENCOUNTER — Encounter: Payer: Self-pay | Admitting: Internal Medicine

## 2011-10-02 ENCOUNTER — Ambulatory Visit (INDEPENDENT_AMBULATORY_CARE_PROVIDER_SITE_OTHER): Payer: Medicaid Other | Admitting: Internal Medicine

## 2011-10-02 ENCOUNTER — Encounter: Payer: Self-pay | Admitting: Internal Medicine

## 2011-10-02 VITALS — BP 110/72 | HR 68 | Ht 71.0 in | Wt 147.2 lb

## 2011-10-02 DIAGNOSIS — K220 Achalasia of cardia: Secondary | ICD-10-CM

## 2011-10-02 DIAGNOSIS — R634 Abnormal weight loss: Secondary | ICD-10-CM

## 2011-10-02 DIAGNOSIS — K219 Gastro-esophageal reflux disease without esophagitis: Secondary | ICD-10-CM

## 2011-10-02 MED ORDER — RANITIDINE HCL 150 MG PO TABS: 150.0000 mg | ORAL_TABLET | Freq: Two times a day (BID) | ORAL | Status: DC

## 2011-10-02 NOTE — Patient Instructions (Addendum)
You have been given a separate informational sheet regarding your tobacco use, the importance of quitting and local resources to help you quit.  Call us back at [phone] with a date you can do your upper endoscopy procedure.  You have been given a GERD diet handout today.  We have sent the following medications to your pharmacy for you to pick up at your convenience: Generic Zantac

## 2011-10-02 NOTE — Progress Notes (Signed)
Subjective:    Patient ID: [name], male    DOB: February 28, 1986, 28 y.o.   MRN: [id number]  HPI this pleasant young [place] American man presents because of problems with regurgitation, vomiting and reflux. He has also lost weight. He is known to have achalasia and is status post laparoscopic Heller myotomy surgery with Dor fundoplication in 2008.  He had done well afterwards but did take Prilosec for reflux. He said that caused diarrhea, he stopped it and actually did okay for a while but over time developed reflux, regurgitation and maybe dysphagia again. He does not remember exactly when he stopped the Prilosec. He started ranitidine  150 mg twice a day sometime recently and said that the symptoms have resolved. Weight changes are summarized below. He does not necessarily restrict caffeine and he is a smoker. He thinks he may be regaining some weight. Wt Readings from Last 3 Encounters:  10/02/11 147 lb 3.2 oz (66.769 kg)  12/25/07 170 lb (77.111 kg)   GI review of systems otherwise negative  He is currently working at the [employer] on weekends. He has a job interview this week also. He has participated in job placement through [employer]. Still lives with mom.  No Known Allergies No outpatient prescriptions prior to visit.   Past Medical History  Diagnosis Date  . Achalasia of esophagus     ? from taking Abilify  . Depression   . Dysphagia   . Odynophagia   . Schizoaffective disorder    Past Surgical History  Procedure Date  . Heller myotomy 02/2007    with Dor Fundoplication- Dr. Lorin Picket [facility])  . Panendoscopy 11/08/2006    with submucosal injection  . Esophagram 08/31/2006  . Panendoscopy 03/09/2006    normal   History   Social History  . Marital Status: Single            .     Occupational History  . [employer]    Social History Main Topics  . Smoking status: Current Everyday Smoker -- 0.5 packs/day for 2 years    Types: Cigarettes  . Smokeless  tobacco: Never Used  . Alcohol Use: No  . Drug Use: No    Family History  Problem Relation Age of Onset  . Diabetes Father        Review of Systems As mentioned in history of present illness, all other review of systems negative.    Objective:   Physical Exam General:  Well-developed, well-nourished and in no acute distress Eyes:  anicteric. ENT:   Mouth and posterior pharynx free of lesions.  Neck:   supple w/o thyromegaly or mass.  Lungs: Clear to auscultation bilaterally. Heart:  S1S2, no rubs, murmurs, gallops. Abdomen:  soft, non-tender, no hepatosplenomegaly, hernia, or mass and BS+.  Lymph:  no cervical or supraclavicular adenopathy. Extremities:   no edema Skin   no rash. Neuro:  A&O x 3.  Psych:  appropriate mood and affect   Data Reviewed:  Old op notes, office notes, endoscopy reports.        Assessment & Plan:   1. GERD (gastroesophageal reflux disease)   2. Weight loss   3. Achalasia    It sounds like he is having reflux related problems and most likely had erosive esophagitis and maybe even some stenosis or stricture formation that is improved now. I think he needs an upper endoscopy to investigate all these given the overall situation. He understands possibility of dilation as well.  Risks benefits medications are reviewed and he understands and agrees to proceed. He is unable to schedule that today because he is uncertain of his schedule. We will ask him to call back. He is advised on quitting smoking and given a handout for that, he is given a GERD diet information sheet as well. I have prescribed ranitidine 150 mg twice a day. He may need something stronger but he does seem improved on that so will continue that at this point.

## 2012-01-22 ENCOUNTER — Telehealth: Payer: Self-pay | Admitting: Internal Medicine

## 2012-01-22 NOTE — Telephone Encounter (Signed)
Patient is offered an appt for 01/26/12, but he declines, he needs a Monday only.  He is scheduled for 02/12/12

## 2012-01-22 NOTE — Telephone Encounter (Signed)
Left message for patient to call back  

## 2012-01-26 ENCOUNTER — Encounter: Payer: Self-pay | Admitting: Internal Medicine

## 2012-01-26 ENCOUNTER — Ambulatory Visit (INDEPENDENT_AMBULATORY_CARE_PROVIDER_SITE_OTHER): Payer: Medicaid Other | Admitting: Internal Medicine

## 2012-01-26 VITALS — BP 120/72 | HR 92 | Ht 71.0 in | Wt 145.0 lb

## 2012-01-26 DIAGNOSIS — K220 Achalasia of cardia: Secondary | ICD-10-CM

## 2012-01-26 DIAGNOSIS — R634 Abnormal weight loss: Secondary | ICD-10-CM

## 2012-01-26 DIAGNOSIS — K219 Gastro-esophageal reflux disease without esophagitis: Secondary | ICD-10-CM

## 2012-01-26 MED ORDER — DEXLANSOPRAZOLE 60 MG PO CPDR: 60.0000 mg | DELAYED_RELEASE_CAPSULE | Freq: Every day | ORAL | Status: DC

## 2012-01-26 NOTE — Progress Notes (Signed)
  Subjective:    Patient ID: [name], male    DOB: 12/20/1985, 26 y.o.   MRN: [id number]  HPI [name] is a 26-year-old African American man with achalasia and schizoaffective disorder who underwent a laparoscopic Heller myotomy with Dor fundoplication in the past. I had seen him in April and he was having increasing reflux and regurgitation symptoms. An endoscopy was recommended but he did not schedule that due to concerns about work conflicts. He follows up now with increasing problems with regurgitation and vomiting and dysphagia. He is losing weight. He is having heartburn. He is taking ranitidine one 50 mg twice a day.  Medications, allergies, past medical history, past surgical history, family history and social history are reviewed and updated in the EMR.  Review of Systems As above    Objective:   Physical Exam Well-developed nourished no acute distress, BMI is 20       Assessment & Plan:   1. Achalasia   2. GERD (gastroesophageal reflux disease)   3. Weight loss    1. Stop ranitidine and start Dexilant 60 g qd samples 2. Ba swallow 8/5 3. EGD was likely pending results of the barium swallow. He may need a dilation as he could have developed stricturing of the lower esophageal sphincter area status post fundoplication.  CC: Carl J. Wescott M.D. 

## 2012-01-26 NOTE — Patient Instructions (Addendum)
You have been given a separate informational sheet regarding your tobacco use, the importance of quitting and local resources to help you quit.  You have been scheduled for a Barium Esophogram at [facility] (1st floor of the hospital) on 01/29/12 at 9:30am. Please arrive 15 minutes prior to your appointment for registration.. If you need to reschedule for any reason, please contact radiology at [phone] to do so.  You have been given samples of Dexilant today.  Take one capsule every morning.  And stop your Ranitidine.  We will call you with plans after we get the test results.  Thank you for choosing me and [facility].  Iva Boop, M.D., [facility]

## 2012-01-27 ENCOUNTER — Encounter: Payer: Self-pay | Admitting: Internal Medicine

## 2012-01-29 ENCOUNTER — Ambulatory Visit (HOSPITAL_COMMUNITY)
Admission: RE | Admit: 2012-01-29 | Discharge: 2012-01-29 | Disposition: A | Discharge status: Home / Self Care | Payer: Medicaid Other | Source: Ambulatory Visit | Attending: Internal Medicine | Admitting: Internal Medicine

## 2012-01-29 DIAGNOSIS — R6889 Other general symptoms and signs: Secondary | ICD-10-CM | POA: Insufficient documentation

## 2012-01-29 DIAGNOSIS — K220 Achalasia of cardia: Secondary | ICD-10-CM

## 2012-01-29 DIAGNOSIS — K219 Gastro-esophageal reflux disease without esophagitis: Secondary | ICD-10-CM

## 2012-01-30 NOTE — Progress Notes (Signed)
Quick Note:  He needs an EGDand dilation at hospital - no fluoro  He prefers Mondays - earliest I could probably do would be Aug 19 at midday vs. Doing the next Monday on my hospital day  He needs to drink liquid supplements like Boost or Ensure 2-3 a day  Also can see if there is another day of the week he might be able to do  Thanks - let me know  FYI he works at [employer] and is usually off after 3 ______

## 2012-02-09 ENCOUNTER — Ambulatory Visit (AMBULATORY_SURGERY_CENTER): Payer: Medicaid Other

## 2012-02-09 VITALS — Ht 71.0 in | Wt 150.0 lb

## 2012-02-09 DIAGNOSIS — K220 Achalasia of cardia: Secondary | ICD-10-CM

## 2012-02-12 ENCOUNTER — Encounter (HOSPITAL_COMMUNITY)
Admission: RE | Disposition: A | Discharge status: Home / Self Care | Payer: Self-pay | Source: Ambulatory Visit | Attending: Internal Medicine

## 2012-02-12 ENCOUNTER — Ambulatory Visit: Payer: Medicaid Other | Admitting: Internal Medicine

## 2012-02-12 ENCOUNTER — Ambulatory Visit (HOSPITAL_COMMUNITY)
Admission: RE | Admit: 2012-02-12 | Discharge: 2012-02-12 | Disposition: A | Discharge status: Home / Self Care | Payer: Medicaid Other | Source: Ambulatory Visit | Attending: Internal Medicine | Admitting: Internal Medicine

## 2012-02-12 ENCOUNTER — Encounter (HOSPITAL_COMMUNITY): Payer: Self-pay | Admitting: *Deleted

## 2012-02-12 DIAGNOSIS — K220 Achalasia of cardia: Secondary | ICD-10-CM | POA: Insufficient documentation

## 2012-02-12 DIAGNOSIS — R1310 Dysphagia, unspecified: Secondary | ICD-10-CM | POA: Insufficient documentation

## 2012-02-12 DIAGNOSIS — K219 Gastro-esophageal reflux disease without esophagitis: Secondary | ICD-10-CM | POA: Insufficient documentation

## 2012-02-12 DIAGNOSIS — T18108A Unspecified foreign body in esophagus causing other injury, initial encounter: Secondary | ICD-10-CM | POA: Insufficient documentation

## 2012-02-12 DIAGNOSIS — R1314 Dysphagia, pharyngoesophageal phase: Secondary | ICD-10-CM

## 2012-02-12 DIAGNOSIS — IMO0002 Reserved for concepts with insufficient information to code with codable children: Secondary | ICD-10-CM | POA: Insufficient documentation

## 2012-02-12 DIAGNOSIS — K208 Other esophagitis without bleeding: Secondary | ICD-10-CM | POA: Insufficient documentation

## 2012-02-12 DIAGNOSIS — K222 Esophageal obstruction: Secondary | ICD-10-CM | POA: Insufficient documentation

## 2012-02-12 DIAGNOSIS — F259 Schizoaffective disorder, unspecified: Secondary | ICD-10-CM | POA: Insufficient documentation

## 2012-02-12 HISTORY — PX: BALLOON DILATION: SHX5330

## 2012-02-12 SURGERY — ESOPHAGOGASTRODUODENOSCOPY (EGD) WITH ESOPHAGEAL DILATION
Anesthesia: Moderate Sedation

## 2012-02-12 MED ORDER — FENTANYL CITRATE 0.05 MG/ML IJ SOLN
INTRAMUSCULAR | Status: DC | PRN
  Administered 2012-02-12 (×2): 25 ug via INTRAVENOUS

## 2012-02-12 MED ORDER — BUTAMBEN-TETRACAINE-BENZOCAINE 2-2-14 % EX AERO
INHALATION_SPRAY | CUTANEOUS | Status: DC | PRN
  Administered 2012-02-12: 2 via TOPICAL

## 2012-02-12 MED ORDER — PANTOPRAZOLE SODIUM 40 MG PO TBEC
40.0000 mg | DELAYED_RELEASE_TABLET | Freq: Every day | ORAL | Status: DC
  Filled 2012-02-12 (×2): qty 1

## 2012-02-12 MED ORDER — PANTOPRAZOLE SODIUM 40 MG PO TBEC: 40.0000 mg | DELAYED_RELEASE_TABLET | Freq: Every day | ORAL | Status: DC

## 2012-02-12 MED ORDER — SODIUM CHLORIDE 0.9 % IV SOLN
INTRAVENOUS | Status: DC
  Administered 2012-02-12: 500 mL via INTRAVENOUS

## 2012-02-12 MED ORDER — MIDAZOLAM HCL 10 MG/2ML IJ SOLN
INTRAMUSCULAR | Status: DC | PRN
  Administered 2012-02-12 (×2): 1 mg via INTRAVENOUS
  Administered 2012-02-12 (×2): 2 mg via INTRAVENOUS

## 2012-02-12 MED ORDER — FENTANYL CITRATE 0.05 MG/ML IJ SOLN
INTRAMUSCULAR | Status: AC
  Filled 2012-02-12: qty 2

## 2012-02-12 MED ORDER — DIPHENHYDRAMINE HCL 50 MG/ML IJ SOLN
INTRAMUSCULAR | Status: AC
  Filled 2012-02-12: qty 1

## 2012-02-12 MED ORDER — MIDAZOLAM HCL 10 MG/2ML IJ SOLN
INTRAMUSCULAR | Status: AC
  Filled 2012-02-12: qty 2

## 2012-02-12 NOTE — H&P (View-Only) (Signed)
  Subjective:    Patient ID: [name], male    DOB: 1985-11-19, 26 y.o.   MRN: [id number]  HPI Colter is a 26 year old African American man with achalasia and schizoaffective disorder who underwent a laparoscopic Heller myotomy with Dor fundoplication in the past. I had seen him in April and he was having increasing reflux and regurgitation symptoms. An endoscopy was recommended but he did not schedule that due to concerns about work conflicts. He follows up now with increasing problems with regurgitation and vomiting and dysphagia. He is losing weight. He is having heartburn. He is taking ranitidine one 50 mg twice a day.  Medications, allergies, past medical history, past surgical history, family history and social history are reviewed and updated in the EMR.  Review of Systems As above    Objective:   Physical Exam Well-developed nourished no acute distress, BMI is 20       Assessment & Plan:   1. Achalasia   2. GERD (gastroesophageal reflux disease)   3. Weight loss    1. Stop ranitidine and start Dexilant 60 g qd samples 2. Ba swallow 8/5 3. EGD was likely pending results of the barium swallow. He may need a dilation as he could have developed stricturing of the lower esophageal sphincter area status post fundoplication.  CC: Flora Lipps. Wescott M.D.

## 2012-02-12 NOTE — Op Note (Signed)
[facility] [address] [place], [address]   ENDOSCOPY PROCEDURE REPORT  PATIENT: [name], [name]  MR#: [id number] BIRTHDATE: 1985/10/09 , 26  yrs. old GENDER: Male ENDOSCOPIST: Iva Boop, MD, Clementeen Graham ASSISTANT:   Darral Dash, RN Kandice Robinsons REFERRED BY: PROCEDURE DATE:  02/12/2012 PROCEDURE:   EGD with balloon dilatation ASA CLASS:   Class II INDICATIONS:1.  dysphagia.   2.  achalasia s/p heller myotomy and Dorr fundoplication with dysphagia and vomiting, stricture on barium swallow MEDICATIONS: Fentanyl 50 mcg IV and Versed 6 mg IV TOPICAL ANESTHETIC:   Cetacaine Spray  DESCRIPTION OF PROCEDURE:   After the risks benefits and alternatives of the procedure were thoroughly explained, informed consent was obtained.  The [id number] ([id number])  endoscope was introduced through the mouth  and advanced to the second portion of the duodenum ,      The instrument was slowly withdrawn as the mucosa was carefully examined.    Retained food was present in the distal esophagus.  Also in stomach. Image was stored via computer.  1,11 A dilatation was found in the total esophagus.  Image was stored via computer.  A linear erosion was found above the stricture. A dilation was then performed at the distal esophagus using a balloon.  Dilator:Balloon  12, 13.5 and 15 mm Good effect and small amount of blood seen.  COMPLICATIONS: There were no complications.    ENDOSCOPIC IMPRESSION: Achalasia with erosive esophagitis, esophageal stricture and gastric food retention. An erosion was found in the distal esophagus  RECOMMENDATIONS: Follow post-dilation diet Call Dr.  Leone Payor in 3 days with symptom update Start pantoprazole 40 mg daily when dexilant samples are gone     _______________________________ eSigned:  Iva Boop, MD, [facility] 02/12/2012 1:33 PM    CC: Sima Matas, MD and the patient  PATIENT NAME:  [name], [name] MR#: [id number]

## 2012-02-12 NOTE — Interval H&P Note (Signed)
History and Physical Interval Note:  02/12/2012 12:41 PM  [name]  has presented today for surgery, with the diagnosis of esophageal stricture  The various methods of treatment have been discussed with the patient and family. After consideration of risks, benefits and other options for treatment, the patient has consented to  Procedure(s) (LRB): ESOPHAGOGASTRODUODENOSCOPY (EGD) WITH ESOPHAGEAL DILATION (N/A) BALLOON DILATION (N/A) as a surgical intervention .  The patient's history has been reviewed, patient examined, no change in status, stable for surgery.  I have reviewed the patient's chart and labs.  Questions were answered to the patient's satisfaction.     Stan Head, MD, [facility]

## 2012-02-13 ENCOUNTER — Encounter (HOSPITAL_COMMUNITY): Payer: Self-pay | Admitting: Internal Medicine

## 2012-02-16 ENCOUNTER — Telehealth: Payer: Self-pay | Admitting: Internal Medicine

## 2012-02-16 NOTE — Telephone Encounter (Signed)
Left a message for patient to call me. 

## 2012-02-19 NOTE — Telephone Encounter (Signed)
Patient aware.  He is scheduled for 03/15/12

## 2012-02-19 NOTE — Telephone Encounter (Signed)
Patient reports that his dysphagia is improved.  He reports that yesterday he had an episode where he thought he was not going to be able to "get it to go down",  But he reports that he was able to slow down and wait in between each bite and every thing was fine.  He will continue the protonix.  He will call back for any questions.

## 2012-02-19 NOTE — Telephone Encounter (Signed)
Left message for patient to call back  

## 2012-02-19 NOTE — Telephone Encounter (Signed)
Have him see me in September (3-4 weeks)

## 2012-03-15 ENCOUNTER — Ambulatory Visit: Payer: Medicaid Other | Admitting: Internal Medicine

## 2012-08-15 ENCOUNTER — Telehealth: Payer: Self-pay | Admitting: Internal Medicine

## 2012-08-15 NOTE — Telephone Encounter (Signed)
Patient called to report 2 month history of vomiting.  He is frustrated that this keeps happening.  He said the last dilation only lasted until January.  He has several days a week ov vomiting after meals, weight loss, and just scared to eat.  He will come in on 08/20/12 2:15 to discuss options.  Dr. Leone Payor does he need any studies, labs prior to appt?

## 2012-08-15 NOTE — Telephone Encounter (Signed)
Make sure he is on a PPI (taking it)  Liquid diet for most part  No studies needed

## 2012-08-15 NOTE — Telephone Encounter (Signed)
Patient aware.

## 2012-08-20 ENCOUNTER — Ambulatory Visit (INDEPENDENT_AMBULATORY_CARE_PROVIDER_SITE_OTHER): Payer: Medicaid Other | Admitting: Internal Medicine

## 2012-08-20 ENCOUNTER — Encounter: Payer: Self-pay | Admitting: Internal Medicine

## 2012-08-20 VITALS — BP 112/60 | HR 88 | Ht 67.5 in | Wt 154.1 lb

## 2012-08-20 DIAGNOSIS — K220 Achalasia of cardia: Secondary | ICD-10-CM

## 2012-08-20 DIAGNOSIS — K222 Esophageal obstruction: Secondary | ICD-10-CM

## 2012-08-20 DIAGNOSIS — R1310 Dysphagia, unspecified: Secondary | ICD-10-CM

## 2012-08-20 DIAGNOSIS — R1314 Dysphagia, pharyngoesophageal phase: Secondary | ICD-10-CM

## 2012-08-20 NOTE — Patient Instructions (Addendum)
You have been given a separate informational sheet regarding your tobacco use, the importance of quitting and local resources to help you quit.  You have been scheduled for an endoscopy with propofol. Please follow written instructions given to you at your visit today. If you use inhalers (even only as needed) or a CPAP machine, please bring them with you on the day of your procedure.  Thank you for choosing me and [facility].  Carl E. Gessner, M.D., FACG  

## 2012-08-20 NOTE — Progress Notes (Signed)
  Subjective:    Patient ID: [name], male    DOB: July 06, 1985, 27 y.o.   MRN: [id number]  HPI He is having recurrent regurgitation/vomiting and dysphagia. Variable times. Is taking PPI. Began to have an increase in sxs inpast few months. Peptic stricture dilated to 15 mm last Fall. Worried about weight loss but is not Wt Readings from Last 3 Encounters:  08/20/12 154 lb 2 oz (69.911 kg)  02/09/12 150 lb (68.04 kg)  01/26/12 145 lb (65.772 kg)  Says he will quit smoking  No Known Allergies Outpatient Prescriptions Prior to Visit  Medication Sig Dispense Refill  . asenapine (SAPHRIS) 5 MG SUBL Place 10 mg under the tongue at bedtime.      . pantoprazole (PROTONIX) 40 MG tablet Take 1 tablet (40 mg total) by mouth daily.  30 tablet  11  . QUEtiapine (SEROQUEL) 300 MG tablet Take 300 mg by mouth at bedtime.      Marland Kitchen dexlansoprazole (DEXILANT) 60 MG capsule Take 1 capsule (60 mg total) by mouth daily.  30 capsule  0   No facility-administered medications prior to visit.   Past Medical History  Diagnosis Date  . Achalasia of esophagus   . Depression   . Schizoaffective disorder   . Erosive esophagitis   . GERD (gastroesophageal reflux disease)    Past Surgical History  Procedure Laterality Date  . Heller myotomy  02/2007    with Dor Fundoplication- Dr. Lorin Picket [facility])  . Panendoscopy  11/08/2006    with submucosal injection  . Esophagram  08/31/2006  . Panendoscopy  03/09/2006    normal  . Balloon dilation  02/12/2012    Procedure: BALLOON DILATION;  Surgeon: Iva Boop, MD;  Location: [facility];  Service: Endoscopy;  Laterality: N/A;   History   Social History  . Marital Status: Single    Spouse Name: N/A    Number of Children: N/A  . Years of Education: HS   Occupational History  . [employer] [place] [employer]   Social History Main Topics  . Smoking status: Current Every Day Smoker -- 0.50 packs/day for 2 years    Types: Cigarettes  . Smokeless  tobacco: Never Used     Comment: given counseling sheet 01-26-12  . Alcohol Use: No  . Drug Use: No  .     Family History  Problem Relation Age of Onset  . Diabetes Father   . 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 Schedule EGD/dilation 2. Clear liquids day before and NPO 6 hrs before The risks and benefits as well as alternatives of endoscopic procedure(s) have been discussed and reviewed. All questions answered. The patient agrees to proceed. May need bid PPI

## 2012-08-22 ENCOUNTER — Ambulatory Visit (AMBULATORY_SURGERY_CENTER): Payer: Medicaid Other | Admitting: Internal Medicine

## 2012-08-22 ENCOUNTER — Encounter: Payer: Self-pay | Admitting: Internal Medicine

## 2012-08-22 VITALS — BP 122/78 | HR 83 | Temp 96.4°F | Resp 11 | Ht 67.0 in | Wt 154.0 lb

## 2012-08-22 DIAGNOSIS — K220 Achalasia of cardia: Secondary | ICD-10-CM

## 2012-08-22 DIAGNOSIS — K222 Esophageal obstruction: Secondary | ICD-10-CM

## 2012-08-22 DIAGNOSIS — R1314 Dysphagia, pharyngoesophageal phase: Secondary | ICD-10-CM

## 2012-08-22 HISTORY — PX: ESOPHAGOGASTRODUODENOSCOPY (EGD) WITH ESOPHAGEAL DILATION: SHX5812

## 2012-08-22 MED ORDER — PANTOPRAZOLE SODIUM 40 MG PO TBEC: 40.0000 mg | DELAYED_RELEASE_TABLET | Freq: Two times a day (BID) | ORAL | Status: DC

## 2012-08-22 MED ORDER — SODIUM CHLORIDE 0.9 % IV SOLN: 500.0000 mL | INTRAVENOUS | Status: DC

## 2012-08-22 NOTE — Patient Instructions (Addendum)
I dilated (stretched) the narrow area where stomach and esophagus join. This should help. I also think some of your medicines slow stomach emptying - so avoid raw vegetables as these are sitting in your stomach.  I also want you to take pantoprazole before breakfast and supper. This will reduce acid that is getting into the esophagus and causing inflammation and narrowing which causes some of the swallowing difficulty.  Please see me in the office in 1 month to make sure you are better.  Thank you for choosing me and [facility].  Iva Boop, MD, FACG  YOU HAD AN ENDOSCOPIC PROCEDURE TODAY AT THE [facility]: Refer to the procedure report that was given to you for any specific questions about what was found during the examination.  If the procedure report does not answer your questions, please call your gastroenterologist to clarify.  If you requested that your care partner not be given the details of your procedure findings, then the procedure report has been included in a sealed envelope for you to review at your convenience later.  YOU SHOULD EXPECT: Some feelings of bloating in the abdomen. Passage of more gas than usual.  Walking can help get rid of the air that was put into your GI tract during the procedure and reduce the bloating. If you had a lower endoscopy (such as a colonoscopy or flexible sigmoidoscopy) you may notice spotting of blood in your stool or on the toilet paper. If you underwent a bowel prep for your procedure, then you may not have a normal bowel movement for a few days.  DIET: see dilation diet below  ACTIVITY: Your care partner should take you home directly after the procedure.  You should plan to take it easy, moving slowly for the rest of the day.  You can resume normal activity the day after the procedure however you should NOT DRIVE or use heavy machinery for 24 hours (because of the sedation medicines used during the test).    SYMPTOMS  TO REPORT IMMEDIATELY: A gastroenterologist can be reached at any hour.  During normal business hours, 8:30 AM to 5:00 PM Monday through Friday, call [phone].  After hours and on weekends, please call the GI answering service at [phone] who will take a message and have the physician on call contact you.   Following upper endoscopy (EGD)  Vomiting of blood or coffee ground material  New chest pain or pain under the shoulder blades  Painful or persistently difficult swallowing  New shortness of breath  Fever of 100F or higher  Black, tarry-looking stools  FOLLOW UP: If any biopsies were taken you will be contacted by phone or by letter within the next 1-3 weeks.  Call your gastroenterologist if you have not heard about the biopsies in 3 weeks.  Our staff will call the home number listed on your records the next business day following your procedure to check on you and address any questions or concerns that you may have at that time regarding the information given to you following your procedure. This is a courtesy call and so if there is no answer at the home number and we have not heard from you through the emergency physician on call, we will assume that you have returned to your regular daily activities without incident.  SIGNATURES/CONFIDENTIALITY: You and/or your care partner have signed paperwork which will be entered into your electronic medical record.  These signatures attest to the fact that that  the information above on your After Visit Summary has been reviewed and is understood.  Full responsibility of the confidentiality of this discharge information lies with you and/or your care-partner.  Dilation diet-handout given  Stricture-handout given  Protonix 40 mg twice a day  See Dr. Leone Payor in 1 month, call office for appointment

## 2012-08-22 NOTE — Op Note (Signed)
[facility] [address] [place], [address]   ENDOSCOPY PROCEDURE REPORT  PATIENT: [name], [name]  MR#: [id number] BIRTHDATE: June 30, 1985 , 26  yrs. old GENDER: Male ENDOSCOPIST: Iva Boop, MD, [facility] PROCEDURE DATE:  08/22/2012 PROCEDURE:  EGD, balloon dilation < 30 mm ASA CLASS:     Class II INDICATIONS:  Dysphagia.  esophageal stricture dilation MEDICATIONS: propofol (Diprivan) 300mg  IV, MAC sedation, administered by CRNA, and These medications were titrated to patient response per physician's verbal order TOPICAL ANESTHETIC: Cetacaine Spray  DESCRIPTION OF PROCEDURE: After the risks benefits and alternatives of the procedure were thoroughly explained, informed consent was obtained.  The LB [id number] endoscope was introduced through the mouth and advanced to the second portion of the duodenum. Without limitations.  The instrument was slowly withdrawn as the mucosa was fully examined.        ESOPHAGUS: Dilated with some food residue, cleared. A stricture was found at the gastroesophageal junction.  The stenosis was traversable with the endoscope. Inflamed and stenotic, dilated with balloon 13.5, 15 and 16 mm, some heme, good result.  STOMACH: Food residue was found in the gastric body.   A small anmount The remainder of the upper endoscopy exam was otherwise normal. Retroflexed views revealed as above and Dor fundoplication. . The scope was then withdrawn from the patient and the procedure completed.  COMPLICATIONS: There were no complications. ENDOSCOPIC IMPRESSION: 1.   Stricture was found at the gastroesophageal junction 2.   Food residue in the gastric body suspect some delayed gastric emptying from meds. 3.   The remainder of the upper endoscopy exam was otherwise normal  RECOMMENDATIONS: 1.  Clear liquids until 5 PM, then soft foods rest of day.  Resume prior diet tomorrow. Avoid raw vegetables. 2.  PPI bid new Rx sent 3.  See Dr. Leone Payor in 1 month eSigned:  Iva Boop, MD, [facility] 08/22/2012 4:14 PM CC:The Patient

## 2012-08-22 NOTE — Progress Notes (Signed)
Called to room to assist during endoscopic procedure.  Patient ID and intended procedure confirmed with present staff. Received instructions for my participation in the procedure from the performing physician.  

## 2012-08-22 NOTE — Progress Notes (Signed)
Patient did not experience any of the following events: a burn prior to discharge; a fall within the facility; wrong site/side/patient/procedure/implant event; or a hospital transfer or hospital admission upon discharge from the facility. (G8907) Patient did not have preoperative order for IV antibiotic SSI prophylaxis. (G8918)  

## 2012-08-23 ENCOUNTER — Telehealth: Payer: Self-pay | Admitting: *Deleted

## 2012-08-23 NOTE — Telephone Encounter (Signed)
Left message that we called for f/u 

## 2012-09-10 ENCOUNTER — Telehealth: Payer: Self-pay | Admitting: Internal Medicine

## 2012-09-10 NOTE — Telephone Encounter (Signed)
FYI Dr. Gessner 

## 2012-09-11 NOTE — Telephone Encounter (Signed)
Ok - I want him to see me in April or May to make sure things stay that way

## 2012-09-11 NOTE — Telephone Encounter (Signed)
Pt has been notified and appt made  

## 2012-10-28 ENCOUNTER — Encounter: Payer: Self-pay | Admitting: Internal Medicine

## 2012-10-28 ENCOUNTER — Ambulatory Visit (INDEPENDENT_AMBULATORY_CARE_PROVIDER_SITE_OTHER): Payer: Medicaid Other | Admitting: Internal Medicine

## 2012-10-28 VITALS — BP 94/60 | HR 96 | Ht 67.5 in | Wt 153.1 lb

## 2012-10-28 DIAGNOSIS — K219 Gastro-esophageal reflux disease without esophagitis: Secondary | ICD-10-CM

## 2012-10-28 DIAGNOSIS — K222 Esophageal obstruction: Secondary | ICD-10-CM

## 2012-10-28 DIAGNOSIS — K220 Achalasia of cardia: Secondary | ICD-10-CM

## 2012-10-28 NOTE — Patient Instructions (Addendum)
Please follow level 5 of the Dysphagia diet handout we have given you today.  Be careful or avoid steak, chicken (white meat).  You may moisten bread/biscuits and keep the pieces small.   Follow up with [place] in 6 weeks, appointment made for 12/09/12 at 9:00am.  Thank you for choosing me and [facility].  Iva Boop, M.D., [facility]

## 2012-10-28 NOTE — Progress Notes (Signed)
  Subjective:    Patient ID: [name], male    DOB: May 23, 1986, 27 y.o.   MRN: [id number]  HPI [name] has achalasia and a post-myotomy GE junction stricture. He was dilated to 16.5 mm in late February and had PPI increased to bid (pantoprazole 40 mg). Did great first month but has had several episodes of vomiting and dysphagia since then. Weight is stable. Noticed that dry chicken breast and biscuit/bread have caused problems.  Medications, allergies, past medical history, past surgical history, family history and social history are reviewed and updated in the EMR.   Review of Systems As above    Objective:   Physical Exam WDWN NAD     Assessment & Plan:  Achalasia of esophagus  Esophageal stricture  GERD (gastroesophageal reflux disease)  He is better but having recurrent sxs. I reviewed a level 5 dysphagia diet (modified regular foods) Have advised to chew well, small pieces of food especially beef, white chicken meat, breads and to moisten breads. He will see me in 6 weeks. If having persistent issues could need a repeat dilation and possible medication modification/addition.     Medication List       These changes are accurate as of: 10/28/2012 10:20 AM. If you have any questions, ask your nurse or doctor.          TAKE these medications       asenapine 5 MG Subl  Commonly known as:  SAPHRIS  Place 10 mg under the tongue at bedtime.     pantoprazole 40 MG tablet  Commonly known as:  PROTONIX  Take 1 tablet (40 mg total) by mouth 2 (two) times daily before a meal. Before breakfast and supper     QUEtiapine 300 MG tablet  Commonly known as:  SEROQUEL  Take 300 mg by mouth at bedtime.

## 2012-12-09 ENCOUNTER — Encounter: Payer: Self-pay | Admitting: Internal Medicine

## 2012-12-09 ENCOUNTER — Ambulatory Visit (INDEPENDENT_AMBULATORY_CARE_PROVIDER_SITE_OTHER): Payer: Medicaid Other | Admitting: Internal Medicine

## 2012-12-09 VITALS — BP 110/70 | HR 80 | Ht 71.0 in | Wt 156.0 lb

## 2012-12-09 DIAGNOSIS — K220 Achalasia of cardia: Secondary | ICD-10-CM

## 2012-12-09 DIAGNOSIS — K222 Esophageal obstruction: Secondary | ICD-10-CM

## 2012-12-09 NOTE — Progress Notes (Signed)
  Subjective:    Patient ID: [name], male    DOB: [birth year], 27 y.o.   MRN: [id number]  HPI Amonte returns, he still having intermittent dysphagia to her four-week period where he didn't have any regurgitation or vomiting as he calls it. He transitioned from cigarettes that E. cigarettes and thinks that has helped. Though he did have that symptom-free. Over the last few weeks she's had several episodes. Medications, allergies, past medical history, past surgical history, family history and social history are reviewed and updated in the EMR.   Review of Systems Continues in [employer] [place] [employer]    Objective:   Physical Exam No acute distress well-developed well-nourished     Assessment & Plan:   1. Benign esophageal stricture   2. Achalasia    1. I recommended a repeat upper endoscopy with dilation. 2. Only dilated in the 16 mm the last time and we have been observing and I think it is worthwhile trying to open this post myotomy reflux-related stricture up to 18 mm if required. The risks and benefits as well as alternatives of endoscopic procedure(s) have been discussed and reviewed. All questions answered. The patient agrees to proceed. Given his achalasia I will have him go on liquids the day before and be n.p.o. 6 hours before this procedure.    Medication List                asenapine 5 MG Subl  Commonly known as:  SAPHRIS  Place 10 mg under the tongue at bedtime.     pantoprazole 40 MG tablet  Commonly known as:  PROTONIX  Take 1 tablet (40 mg total) by mouth 2 (two) times daily before a meal. Before breakfast and supper     QUEtiapine 300 MG tablet  Commonly known as:  SEROQUEL  Take 300 mg by mouth at bedtime.          NF:[id number],HQIONG, FNP

## 2012-12-09 NOTE — Patient Instructions (Addendum)
You have been scheduled for an endoscopy with propofol. Please follow written instructions given to you at your visit today. If you use inhalers (even only as needed), please bring them with you on the day of your procedure. Your physician has requested that you go to www.[email or web address].com and enter the access code given to you at your visit today. This web site gives a general overview about your procedure. However, you should still follow specific instructions given to you by our office regarding your preparation for the procedure.  I appreciate the opportunity to care for you.  

## 2012-12-30 ENCOUNTER — Encounter: Payer: Medicaid Other | Admitting: Internal Medicine

## 2013-09-08 ENCOUNTER — Other Ambulatory Visit: Payer: Self-pay | Admitting: Internal Medicine

## 2013-11-18 ENCOUNTER — Telehealth: Payer: Self-pay | Admitting: Internal Medicine

## 2013-11-18 NOTE — Telephone Encounter (Signed)
Spoke with Legrand Como and he doesn't want to set up an EGD/dil at this time.  He wants to speak with Dr. Carlean Purl. I will forward this to him.

## 2013-11-18 NOTE — Telephone Encounter (Signed)
LM on patient's VM to call back to set up EGD/Dil and pre-visit appointments.

## 2013-11-18 NOTE — Telephone Encounter (Signed)
[name] needs to be set up for EGD and dilation in [place] soon please  He should be NPO 6 hours before procedure

## 2013-11-18 NOTE — Telephone Encounter (Signed)
Spoke with patient, he said the Protonix is not helping.  He is taking it twice a day before meals.  He reports vomiting, no fever for the past 1-2 weeks.  Please advise , said he can't come in this week maybe next week.

## 2013-11-24 NOTE — Telephone Encounter (Signed)
I called [name] - hold on ranitidine I asked him to see me tomorrow at 215 Please add to schedule reason is dysphagia

## 2013-11-24 NOTE — Telephone Encounter (Signed)
Patient phoned back in today requesting a rx for [facility]antac [facility] [name] this has helped him.  Will get Dr. Carlean Purl to advise.

## 2013-11-25 ENCOUNTER — Encounter: Payer: Self-pay | Admitting: Internal Medicine

## 2013-11-25 ENCOUNTER — Other Ambulatory Visit: Payer: Self-pay | Admitting: Internal Medicine

## 2013-11-25 ENCOUNTER — Ambulatory Visit (INDEPENDENT_AMBULATORY_CARE_PROVIDER_SITE_OTHER): Payer: Medicaid Other | Admitting: Internal Medicine

## 2013-11-25 VITALS — BP 110/80 | HR 100 | Ht 67.5 in | Wt 154.0 lb

## 2013-11-25 DIAGNOSIS — R1314 Dysphagia, pharyngoesophageal phase: Secondary | ICD-10-CM

## 2013-11-25 DIAGNOSIS — R1319 Other dysphagia: Secondary | ICD-10-CM

## 2013-11-25 DIAGNOSIS — K220 Achalasia of cardia: Secondary | ICD-10-CM

## 2013-11-25 DIAGNOSIS — K222 Esophageal obstruction: Secondary | ICD-10-CM

## 2013-11-25 DIAGNOSIS — R1310 Dysphagia, unspecified: Secondary | ICD-10-CM

## 2013-11-25 NOTE — Assessment & Plan Note (Addendum)
Stricture + achalasaia Dilation next week May need barium study Level 3 dysphagia diet If not working then level 2

## 2013-11-25 NOTE — Assessment & Plan Note (Addendum)
Sounds like this is recurrent Needs EGD/dili The risks and benefits as well as alternatives of endoscopic procedure(s) have been discussed and reviewed. All questions answered. The patient agrees to proceed. Try to repeat dilation and get to 18 mm and closer f/u

## 2013-11-25 NOTE — Progress Notes (Signed)
         Subjective:    Patient ID: [name], male    DOB: September 27, 1985, 28 y.o.   MRN: [id number]  HPI Having recurrent dysphagia and food impactions. Last dilated to 16 mm 11 months ago - did not want a repeat dilation then as advised. Thinks he is losing weight but not. Wt Readings from Last 3 Encounters:  11/25/13 154 lb (69.854 kg)  12/09/12 156 lb (70.761 kg)  10/28/12 153 lb 2 oz (69.457 kg)   He is regurgitating after most meals. Review of Systems As above    Objective:   Physical Exam wdwn nad     Assessment & Plan:  Esophageal stricture Sounds like this is recurrent Needs EGD/dili The risks and benefits as well as alternatives of endoscopic procedure(s) have been discussed and reviewed. All questions answered. The patient agrees to proceed. Try to repeat dilation and get to 18 mm and closer f/u    ACHALASIA Part of dysphagia problem  Esophageal dysphagia Stricture + achalasaia Dilation next week May need barium study Level 3 dysphagia diet If not working then level 2

## 2013-11-25 NOTE — Patient Instructions (Addendum)
You have been scheduled for an endoscopy with propofol. Please follow the written instructions given to you at your visit today. Please pick up your prep at the pharmacy within the next 1-3 days. If you use inhalers (even only as needed), please bring them with you on the day of your procedure.   Today you have been given a dysphagia diet handout to read and follow, use level 3 and if that doesn't work try level 2.   I appreciate the opportunity to care for you.

## 2013-11-25 NOTE — Telephone Encounter (Signed)
How many refills Sir? Thank you. 

## 2013-11-25 NOTE — Assessment & Plan Note (Signed)
Part of dysphagia problem

## 2013-11-26 NOTE — Telephone Encounter (Signed)
11

## 2013-11-28 ENCOUNTER — Telehealth: Payer: Self-pay | Admitting: Internal Medicine

## 2013-11-28 MED ORDER — RANITIDINE HCL 300 MG PO TABS: 300.0000 mg | ORAL_TABLET | Freq: Two times a day (BID) | ORAL | Status: DC

## 2013-11-28 NOTE — Telephone Encounter (Signed)
Spoke with patient and informed him that [facility] was [facility] and that he still may need a dilation.  He will keep [place] up to date on his status.  I told him there are other PPI's if the reanitidine doesn't work.

## 2013-11-28 NOTE — Telephone Encounter (Signed)
Patient canceled EGD/dil for 12/03/13.  Requesting Zantac rx, please advise Sir.  Thank you.

## 2013-11-28 NOTE — Telephone Encounter (Signed)
He can use ranitidine 300 mg bid #60 11 RF  However I think he will continue to have problems and need a dilation.  I also think pantoprazole (or another PPI if this is too costly) is better treatment

## 2013-12-03 ENCOUNTER — Encounter: Payer: Medicaid Other | Admitting: Internal Medicine

## 2013-12-05 ENCOUNTER — Telehealth: Payer: Self-pay | Admitting: Internal Medicine

## 2013-12-05 NOTE — Telephone Encounter (Signed)
Patient started the Zantac 300mg  today Sir, has already taken it twice today and is not happy with this dose.  He is requesting we send in the Zantac 150mg  for him.  Please advise.  I asked him if he felt it was too strong and he said no that he just wanted the 150mg .

## 2013-12-05 NOTE — Telephone Encounter (Signed)
LM on his VM to call me back.

## 2013-12-08 NOTE — Telephone Encounter (Signed)
My recommendation is the 300 mg which should be better for him.

## 2013-12-08 NOTE — Telephone Encounter (Signed)
Spoke with patient and told him you recommend him trying the 300mg  of zantac twice a day.  He said the 150mg  helping so I'm not sure he will try the higher dose.

## 2014-09-24 ENCOUNTER — Telehealth: Payer: Self-pay | Admitting: Internal Medicine

## 2014-09-24 MED ORDER — RANITIDINE HCL 300 MG PO TABS: 300.0000 mg | ORAL_TABLET | Freq: Two times a day (BID) | ORAL | Status: DC

## 2014-09-24 NOTE — Telephone Encounter (Signed)
Rx sent in as requested. 

## 2015-01-14 ENCOUNTER — Telehealth: Payer: Self-pay | Admitting: Internal Medicine

## 2015-01-14 MED ORDER — OMEPRAZOLE 40 MG PO CPDR: 40.0000 mg | DELAYED_RELEASE_CAPSULE | Freq: Two times a day (BID) | ORAL | Status: DC

## 2015-01-14 NOTE — Telephone Encounter (Signed)
Patient reports GERD and dysphagia.  He is not interested in having a repeat procedure at this time.  He wants to change from protonix BID to prilosec.  He states that the protonix does not help.  He is advised will send in rx.  I did schedule an appt for follow up with him for 03/05/15

## 2015-03-05 ENCOUNTER — Encounter: Payer: Self-pay | Admitting: Internal Medicine

## 2015-03-05 ENCOUNTER — Ambulatory Visit (INDEPENDENT_AMBULATORY_CARE_PROVIDER_SITE_OTHER): Payer: Medicaid Other | Admitting: Internal Medicine

## 2015-03-05 VITALS — BP 110/60 | HR 120 | Ht 67.5 in | Wt 159.0 lb

## 2015-03-05 DIAGNOSIS — K220 Achalasia of cardia: Secondary | ICD-10-CM

## 2015-03-05 DIAGNOSIS — K222 Esophageal obstruction: Secondary | ICD-10-CM

## 2015-03-05 DIAGNOSIS — K2100 Gastro-esophageal reflux disease with esophagitis, without bleeding: Secondary | ICD-10-CM

## 2015-03-05 MED ORDER — OMEPRAZOLE 40 MG PO CPDR: 40.0000 mg | DELAYED_RELEASE_CAPSULE | Freq: Two times a day (BID) | ORAL | Status: DC

## 2015-03-05 NOTE — Assessment & Plan Note (Signed)
Stay on bid PPI RTC 1 year OV recall

## 2015-03-05 NOTE — Progress Notes (Signed)
   Subjective:    Patient ID: [name], male    DOB: 1986-02-28, 29 y.o.   MRN: [id number] Cc: f/u GERD and Achalasia HPI Ad been well - stopped pantoprazole and began using ranitidine in past few months,. Began to have recurrent reflux and dysphagia. Got back on PPI through urgent care - and is much better. He has  reduced food intake - I eat "One vegetable instead of 2" Frequent small meals Wants copy of dysphagia diet again   Wt Readings from Last 3 Encounters:  03/05/15 159 lb (72.122 kg)  11/25/13 154 lb (69.854 kg)  12/09/12 156 lb (70.761 kg)  Medications, allergies, past medical history, past surgical history, family history and social history are reviewed and updated in the EMR.  Review of Systems As above     Objective:   Physical Exam BP 110/60 mmHg  Pulse 120  Ht 5' 7.5" (1.715 m)  Wt 159 lb (72.122 kg)  BMI 24.52 kg/m2 NAD    Assessment & Plan:  ACHALASIA Stable on current regimen  Esophageal stricture Not symptomatic  GERD (gastroesophageal reflux disease) Stay on bid PPI RTC 1 year OV recall

## 2015-03-05 NOTE — Assessment & Plan Note (Signed)
Stable on current regimen   

## 2015-03-05 NOTE — Patient Instructions (Signed)
  We are putting you in for a office visit recall for a year.   Today you have been given a dysphagia diet, use level #3.   I appreciate the opportunity to care for you. Silvano Rusk, MD, [facility]

## 2015-03-05 NOTE — Assessment & Plan Note (Signed)
Not symptomatic  

## 2015-06-22 ENCOUNTER — Telehealth: Payer: Self-pay | Admitting: Internal Medicine

## 2015-06-22 MED ORDER — ESOMEPRAZOLE MAGNESIUM 40 MG PO CPDR: 40.0000 mg | DELAYED_RELEASE_CAPSULE | Freq: Two times a day (BID) | ORAL | Status: DC

## 2015-06-22 NOTE — Telephone Encounter (Signed)
Informed patient that nexium rx sent in as requested.

## 2015-06-22 NOTE — Telephone Encounter (Signed)
Do you want me to send it in BID like he's doing the Prilosec? Thanks.

## 2015-06-22 NOTE — Telephone Encounter (Signed)
yes

## 2015-06-22 NOTE — Telephone Encounter (Signed)
Ok to send rx for Nexium

## 2015-06-23 ENCOUNTER — Telehealth: Payer: Self-pay

## 2015-06-23 NOTE — Telephone Encounter (Signed)
Spoke with a [employer] Rep to do a prior authorization for generic Nexium 40mg  capsule, one capsule twice a day. Patient has tried/failed omeprazole 40 mg BID, zantac 150mg  BID.  Dx : GERD k21.9.  The case # is : [id number].  I will be notified by phone of approval/denial within 48 hours she said.

## 2015-06-29 NOTE — Telephone Encounter (Signed)
Spoke with [place] [employer] and was informed by [facility] that the brand name Nexium is approved, generic is not.  [facility]

## 2015-07-23 ENCOUNTER — Telehealth: Payer: Self-pay | Admitting: Internal Medicine

## 2015-07-23 NOTE — Telephone Encounter (Signed)
Patient is scheduled to see Arta Bruce, PA on 07/27/15 2:15.  Patient with a history of achalasia

## 2015-07-27 ENCOUNTER — Ambulatory Visit (INDEPENDENT_AMBULATORY_CARE_PROVIDER_SITE_OTHER): Payer: Medicaid Other | Admitting: Physician Assistant

## 2015-07-27 ENCOUNTER — Encounter: Payer: Self-pay | Admitting: Physician Assistant

## 2015-07-27 VITALS — BP 132/80 | HR 88 | Ht 67.5 in | Wt 163.0 lb

## 2015-07-27 DIAGNOSIS — R1310 Dysphagia, unspecified: Secondary | ICD-10-CM | POA: Diagnosis not present

## 2015-07-27 DIAGNOSIS — K219 Gastro-esophageal reflux disease without esophagitis: Secondary | ICD-10-CM

## 2015-07-27 DIAGNOSIS — K220 Achalasia of cardia: Secondary | ICD-10-CM

## 2015-07-27 MED ORDER — ESOMEPRAZOLE MAGNESIUM 40 MG PO CPDR: 40.0000 mg | DELAYED_RELEASE_CAPSULE | Freq: Two times a day (BID) | ORAL | Status: DC

## 2015-07-27 NOTE — Patient Instructions (Addendum)
We have sent the following medications to your pharmacy for you to pick up at your convenience: Esomeprazole 40 mg twice a day   You have been scheduled for an endoscopy. Please follow written instructions given to you at your visit today. If you use inhalers (even only as needed), please bring them with you on the day of your procedure. Your physician has requested that you go to www.startemmi.com and enter the access code given to you at your visit today. This web site gives a general overview about your procedure. However, you should still follow specific instructions given to you by our office regarding your preparation for the procedure.  Please be sure to have only clear liquids the day before and nothing to eat or drink after midnight the day of the procedure.

## 2015-07-27 NOTE — Progress Notes (Signed)
Patient ID: [name], male   DOB: Dec 30, 1985, 30 y.o.   MRN: [id number]     History of Present Illness: [name]  This pleasant 30 year old male known to Dr. Carlean Purl with a history of esophageal strictures and achalasia. He is status post a Heller myotomy with fundoplication in September 2008. He has required balloon dilations of esophageal strictures. His last EGD was on 08/22/2012 at which time a stricture was found at the GE junction I'm was dilated. He percent today stating that for the past several months he has had worsening dysphagia to solids. He often has to spit his food up. More recently over the past several weeks he has had dysphagia to liquids as well and states they sometimes, right up. He has been using his Nexium twice daily. He has no epigastric pain and does not feel nauseous, rather he feels his food is getting stuck in the mid chest and coming right back up.   Past Medical History  Diagnosis Date  . Achalasia of esophagus   . Depression   . Schizoaffective disorder   . Erosive esophagitis   . GERD (gastroesophageal reflux disease)     Past Surgical History  Procedure Laterality Date  . Heller myotomy  02/2007    with Dor Fundoplication- Dr. Garner Nash [facility])  . Panendoscopy  11/08/2006    with submucosal injection  . Esophagram  08/31/2006  . Panendoscopy  03/09/2006    normal  . Balloon dilation  02/12/2012    Procedure: BALLOON DILATION;  Surgeon: Gatha Mayer, MD;  Location: [facility];  Service: Endoscopy;  Laterality: N/A;  . Esophagogastroduodenoscopy (egd) with esophageal dilation  08/22/12   Family History  Problem Relation Age of Onset  . Diabetes Father   . Colon cancer Neg Hx    Social History  Substance Use Topics  . Smoking status: Current Every Day Smoker -- 0.50 packs/day for 2 years    Types: Cigarettes  . Smokeless tobacco: Never Used     Comment: given counseling sheet 01-26-12, pt started e-sig  . Alcohol Use: No   Current  Outpatient Prescriptions  Medication Sig Dispense Refill  . asenapine (SAPHRIS) 5 MG SUBL Place 10 mg under the tongue at bedtime.    Marland Kitchen esomeprazole (NEXIUM) 40 MG capsule Take 1 capsule (40 mg total) by mouth 2 (two) times daily before a meal. 60 capsule 3  . QUEtiapine (SEROQUEL) 300 MG tablet Take 300 mg by mouth at bedtime.     No current facility-administered medications for this visit.   No Known Allergies   Review of Systems: Gen: Denies any fever, chills, sweats, anorexia, fatigue, weakness, malaise, weight loss, and sleep disorder CV: Denies chest pain, angina, palpitations, syncope, orthopnea, PND, peripheral edema, and claudication. Resp: Denies dyspnea at rest, dyspnea with exercise, cough, sputum, wheezing, coughing up blood, and pleurisy. GI: Denies vomiting blood, jaundice, and fecal incontinence.    Admits to dysphagia to solids and liquids. GU : Denies urinary burning, blood in urine, urinary frequency, urinary hesitancy, nocturnal urination, and urinary incontinence. MS: Denies joint pain, limitation of movement, and swelling, stiffness, low back pain, extremity pain. Denies muscle weakness, cramps, atrophy.  Derm: Denies rash, itching, dry skin, hives, moles, warts, or unhealing ulcers.  Psych: Denies depression, anxiety, memory loss, suicidal ideation, hallucinations, paranoia, and confusion. Heme: Denies bruising, bleeding, and enlarged lymph nodes. Neuro:  Denies any headaches, dizziness, paresthesia Endo:  Denies any problems with DM, thyroid, adrenal    Physical Exam: BP  132/80 mmHg  Pulse 88  Ht 5' 7.5" (1.715 m)  Wt 163 lb (73.936 kg)  BMI 25.14 kg/m2 General: Pleasant, well developed ,  African-Americanmale in no acute distress Head: Normocephalic and atraumatic Eyes:  sclerae anicteric, conjunctiva pink  Ears: Normal auditory acuity Lungs: Clear throughout to auscultation Heart: Regular rate and rhythm Abdomen: Soft, non distended, non-tender. No  masses, no hepatomegaly. Normal bowel sounds Musculoskeletal: Symmetrical with no gross deformities  Extremities: No edema  Neurological: Alert oriented x 4, grossly nonfocal Psychological:  Alert and cooperative. Normal mood and affect  Assessment and Recommendations:  30 year old male with a history of GERD , dysphagia, and achalasia, status post Heller myotomy, now with recurrent dysphagia.  Patient may have a recurrent stricture. He will be scheduled for an EGD with possible dilation.The risks, benefits, and alternatives to endoscopy with possible biopsy and possible dilation were discussed with the patient and they consent to proceed.   The procedure will be scheduled with Dr. Carlean Purl. Further recommendations will be made pending the findings of the above.        Schyler Counsell, Deloris Ping 07/27/2015,

## 2015-07-27 NOTE — Progress Notes (Signed)
Agree w/ Ms. Hvozdovic's note and mangement.  

## 2015-08-06 ENCOUNTER — Encounter: Payer: Self-pay | Admitting: Internal Medicine

## 2015-08-06 ENCOUNTER — Ambulatory Visit (AMBULATORY_SURGERY_CENTER): Payer: Medicaid Other | Admitting: Internal Medicine

## 2015-08-06 VITALS — BP 104/53 | HR 91 | Temp 95.5°F | Resp 15 | Ht 67.5 in | Wt 163.0 lb

## 2015-08-06 DIAGNOSIS — K222 Esophageal obstruction: Secondary | ICD-10-CM

## 2015-08-06 DIAGNOSIS — R1310 Dysphagia, unspecified: Secondary | ICD-10-CM

## 2015-08-06 DIAGNOSIS — K220 Achalasia of cardia: Secondary | ICD-10-CM | POA: Diagnosis not present

## 2015-08-06 MED ORDER — SODIUM CHLORIDE 0.9 % IV SOLN: 500.0000 mL | INTRAVENOUS | Status: DC

## 2015-08-06 NOTE — Progress Notes (Signed)
Called to room to assist during endoscopic procedure.  Patient ID and intended procedure confirmed with present staff. Received instructions for my participation in the procedure from the performing physician.  

## 2015-08-06 NOTE — Progress Notes (Signed)
A/o x 3 pleased with MAC report to [employer]

## 2015-08-06 NOTE — Op Note (Signed)
[place]  [address] [place], [address]   ENDOSCOPY PROCEDURE REPORT  PATIENT: Alexander, [name]  MR#: [id number] BIRTHDATE: 1985-10-31 , 29  yrs. old GENDER: male ENDOSCOPIST: Gatha Mayer, MD, [facility] PROCEDURE DATE:  08/06/2015 PROCEDURE:  Esophagoscopy + balloon dilation < 30 mm ASA CLASS:     Class II INDICATIONS:  therapeutic procedure and dysphagia after heller myotomy, achalasia. MEDICATIONS: Propofol 300 mg IV and Monitored anesthesia care TOPICAL ANESTHETIC: none  DESCRIPTION OF PROCEDURE: After the risks benefits and alternatives of the procedure were thoroughly explained, informed consent was obtained.  The LB [id number] endoscope was introduced through the mouth and advanced to the second portion of the duodenum , Without limitations.  The instrument was slowly withdrawn as the mucosa was fully examined.    1) Dilated fluid-filled esophagus and a few pieces of food - suctioned and advanced into stomach 2) Stenosis and some esophagitis at GE junction, s/p wrap 3) Fluid and some food in proximal stomach - suctioned 4) Otherwise normal to pylorus 5) 18 mm balloon dilation at GE junction, good result.  Retroflexed views revealed as previously described.     The scope was then withdrawn from the patient and the procedure completed.  COMPLICATIONS: There were no immediate complications.  ENDOSCOPIC IMPRESSION: 1) Dilated fluid-filled esophagus and a few pieces of food - suctioned and advanced into stomach 2) Stenosis and some esophagitis at GE junction, s/p wrap 3) Fluid and some food in proximal stomach - suctioned 4) Otherwise normal to pylorus 5) 18 mm balloon dilation at GE junction, good result  RECOMMENDATIONS: 1.  Clear liquids until 0900 , then soft foods rest of day.  Resume prior diet tomorrow. 2.  We will contact him in 2-3 weeks to see how swallowing is 3.  Recall EGD 1 year  eSigned:  Gatha Mayer, MD, [facility]  08/06/2015 8:02 AM    CC:The Patient

## 2015-08-06 NOTE — Patient Instructions (Addendum)
I stretched the esophagus again - I hope that helps.  We will check on you in a couple of weeks - call me back sooner if no help.  I appreciate the opportunity to care for you. Gatha Mayer, MD, [facility]  Discharge instructions given. Handout on a dilatation diet. Resume previous medications. YOU HAD AN ENDOSCOPIC PROCEDURE TODAY AT [facility]:   Refer to the procedure report that was given to you for any specific questions about what was found during the examination.  If the procedure report does not answer your questions, please call your gastroenterologist to clarify.  If you requested that your care partner not be given the details of your procedure findings, then the procedure report has been included in a sealed envelope for you to review at your convenience later.  YOU SHOULD EXPECT: Some feelings of bloating in the abdomen. Passage of more gas than usual.  Walking can help get rid of the air that was put into your GI tract during the procedure and reduce the bloating. If you had a lower endoscopy (such as a colonoscopy or flexible sigmoidoscopy) you may notice spotting of blood in your stool or on the toilet paper. If you underwent a bowel prep for your procedure, you may not have a normal bowel movement for a few days.  Please Note:  You might notice some irritation and congestion in your nose or some drainage.  This is from the oxygen used during your procedure.  There is no need for concern and it should clear up in a day or so.  SYMPTOMS TO REPORT IMMEDIATELY:    Following upper endoscopy (EGD)  Vomiting of blood or coffee ground material  New chest pain or pain under the shoulder blades  Painful or persistently difficult swallowing  New shortness of breath  Fever of 100F or higher  Black, tarry-looking stools  For urgent or emergent issues, a gastroenterologist can be reached at any hour by calling [phone].   DIET: Your first meal  following the procedure should be a small meal and then it is ok to progress to your normal diet. Heavy or fried foods are harder to digest and may make you feel nauseous or bloated.  Likewise, meals heavy in dairy and vegetables can increase bloating.  Drink plenty of fluids but you should avoid alcoholic beverages for 24 hours.  ACTIVITY:  You should plan to take it easy for the rest of today and you should NOT DRIVE or use heavy machinery until tomorrow (because of the sedation medicines used during the test).    FOLLOW UP: Our staff will call the number listed on your records the next business day following your procedure to check on you and address any questions or concerns that you may have regarding the information given to you following your procedure. If we do not reach you, we will leave a message.  However, if you are feeling well and you are not experiencing any problems, there is no need to return our call.  We will assume that you have returned to your regular daily activities without incident.  If any biopsies were taken you will be contacted by phone or by letter within the next 1-3 weeks.  Please call us at [phone] if you have not heard about the biopsies in 3 weeks.    SIGNATURES/CONFIDENTIALITY: You and/or your care partner have signed paperwork which will be entered into your electronic medical record.  These signatures  attest to the fact that that the information above on your After Visit Summary has been reviewed and is understood.  Full responsibility of the confidentiality of this discharge information lies with you and/or your care-partner.

## 2015-08-09 ENCOUNTER — Telehealth: Payer: Self-pay | Admitting: *Deleted

## 2015-08-09 NOTE — Telephone Encounter (Signed)
  Follow up Call-  Call back number 08/06/2015  Post procedure Call Back phone  # [phone]  Permission to leave phone message Yes     Patient questions:  Do you have a fever, pain , or abdominal swelling? No. Pain Score  0 *  Have you tolerated food without any problems? Yes.    Have you been able to return to your normal activities? Yes.    Do you have any questions about your discharge instructions: Diet   No. Medications  No. Follow up visit  No.  Do you have questions or concerns about your Care? No.  Actions: * If pain score is 4 or above: No action needed, pain <4.

## 2015-08-17 ENCOUNTER — Telehealth: Payer: Self-pay | Admitting: Internal Medicine

## 2015-08-17 DIAGNOSIS — K220 Achalasia of cardia: Secondary | ICD-10-CM

## 2015-08-17 DIAGNOSIS — R112 Nausea with vomiting, unspecified: Secondary | ICD-10-CM

## 2015-08-17 NOTE — Telephone Encounter (Signed)
Patient reports that he is having vomiting after meals.  He reports that he is not having difficulty even swallowing.  He reports that he had episodes of vomiting today and on Saturday.  He is advised to remain on a liquid diet until Dr. Carlean Purl can reply

## 2015-08-17 NOTE — Telephone Encounter (Signed)
He needs to have a barium swallow this week please

## 2015-08-18 NOTE — Telephone Encounter (Signed)
Patient calling regarding this. Best # [phone].

## 2015-08-18 NOTE — Telephone Encounter (Signed)
Patient is notified of the recommendations.   He is scheduled for 08/19/15 9:30 at [facility].  He is notified to be NPO for 3 hours prior.

## 2015-08-19 ENCOUNTER — Ambulatory Visit (HOSPITAL_COMMUNITY)
Admission: RE | Admit: 2015-08-19 | Discharge: 2015-08-19 | Disposition: A | Discharge status: Home / Self Care | Payer: Medicaid Other | Source: Ambulatory Visit | Attending: Internal Medicine | Admitting: Internal Medicine

## 2015-08-19 ENCOUNTER — Telehealth: Payer: Self-pay | Admitting: Internal Medicine

## 2015-08-19 DIAGNOSIS — R112 Nausea with vomiting, unspecified: Secondary | ICD-10-CM | POA: Insufficient documentation

## 2015-08-19 DIAGNOSIS — K220 Achalasia of cardia: Secondary | ICD-10-CM | POA: Insufficient documentation

## 2015-08-19 DIAGNOSIS — K222 Esophageal obstruction: Secondary | ICD-10-CM | POA: Diagnosis not present

## 2015-08-19 NOTE — Progress Notes (Signed)
Quick Note:  Reviewed with Dr. Judeth Cornfield who did his surgery - he will have Jerrin see him and probably try larger balloon dilaton I have explained to patient and his mom ______

## 2015-08-19 NOTE — Telephone Encounter (Signed)
Left message for patient to call back Dr. Carlean Purl were you trying to reach her?

## 2015-08-30 ENCOUNTER — Telehealth: Payer: Self-pay | Admitting: Internal Medicine

## 2015-08-30 NOTE — Telephone Encounter (Signed)
Noted  

## 2015-10-04 ENCOUNTER — Inpatient Hospital Stay (HOSPITAL_COMMUNITY)
Admission: EM | Admit: 2015-10-04 | Discharge: 2015-10-07 | DRG: 812 | Disposition: A | Discharge status: Home / Self Care | Payer: Medicaid Other | Attending: Family Medicine | Admitting: Family Medicine

## 2015-10-04 ENCOUNTER — Encounter (HOSPITAL_COMMUNITY): Payer: Self-pay | Admitting: Emergency Medicine

## 2015-10-04 DIAGNOSIS — K2210 Ulcer of esophagus without bleeding: Secondary | ICD-10-CM | POA: Diagnosis present

## 2015-10-04 DIAGNOSIS — F329 Major depressive disorder, single episode, unspecified: Secondary | ICD-10-CM | POA: Diagnosis present

## 2015-10-04 DIAGNOSIS — F259 Schizoaffective disorder, unspecified: Secondary | ICD-10-CM | POA: Diagnosis present

## 2015-10-04 DIAGNOSIS — K228 Other specified diseases of esophagus: Secondary | ICD-10-CM | POA: Diagnosis not present

## 2015-10-04 DIAGNOSIS — K219 Gastro-esophageal reflux disease without esophagitis: Secondary | ICD-10-CM | POA: Diagnosis present

## 2015-10-04 DIAGNOSIS — F17210 Nicotine dependence, cigarettes, uncomplicated: Secondary | ICD-10-CM | POA: Diagnosis present

## 2015-10-04 DIAGNOSIS — D500 Iron deficiency anemia secondary to blood loss (chronic): Secondary | ICD-10-CM | POA: Diagnosis present

## 2015-10-04 DIAGNOSIS — R531 Weakness: Secondary | ICD-10-CM

## 2015-10-04 DIAGNOSIS — D75839 Thrombocytosis, unspecified: Secondary | ICD-10-CM

## 2015-10-04 DIAGNOSIS — D7589 Other specified diseases of blood and blood-forming organs: Secondary | ICD-10-CM | POA: Diagnosis present

## 2015-10-04 DIAGNOSIS — D649 Anemia, unspecified: Secondary | ICD-10-CM | POA: Insufficient documentation

## 2015-10-04 DIAGNOSIS — D509 Iron deficiency anemia, unspecified: Secondary | ICD-10-CM | POA: Diagnosis present

## 2015-10-04 DIAGNOSIS — K222 Esophageal obstruction: Secondary | ICD-10-CM | POA: Diagnosis not present

## 2015-10-04 DIAGNOSIS — R1319 Other dysphagia: Secondary | ICD-10-CM | POA: Diagnosis present

## 2015-10-04 DIAGNOSIS — Z79899 Other long term (current) drug therapy: Secondary | ICD-10-CM | POA: Diagnosis not present

## 2015-10-04 DIAGNOSIS — Z833 Family history of diabetes mellitus: Secondary | ICD-10-CM

## 2015-10-04 DIAGNOSIS — K220 Achalasia of cardia: Secondary | ICD-10-CM | POA: Diagnosis present

## 2015-10-04 DIAGNOSIS — F200 Paranoid schizophrenia: Secondary | ICD-10-CM | POA: Insufficient documentation

## 2015-10-04 DIAGNOSIS — F209 Schizophrenia, unspecified: Secondary | ICD-10-CM

## 2015-10-04 DIAGNOSIS — D473 Essential (hemorrhagic) thrombocythemia: Secondary | ICD-10-CM | POA: Diagnosis not present

## 2015-10-04 DIAGNOSIS — K648 Other hemorrhoids: Secondary | ICD-10-CM | POA: Diagnosis not present

## 2015-10-04 LAB — IRON AND TIBC
IRON: 7 ug/dL — AB (ref 45–182)
SATURATION RATIOS: 1 % — AB (ref 17.9–39.5)
TIBC: 577 ug/dL — AB (ref 250–450)
UIBC: 570 ug/dL

## 2015-10-04 LAB — COMPREHENSIVE METABOLIC PANEL
ALBUMIN: 4.3 g/dL (ref 3.5–5.0)
ALK PHOS: 42 U/L (ref 38–126)
ALT: 14 U/L — AB (ref 17–63)
ANION GAP: 10 (ref 5–15)
AST: 16 U/L (ref 15–41)
BUN: 13 mg/dL (ref 6–20)
CALCIUM: 9.5 mg/dL (ref 8.9–10.3)
CHLORIDE: 106 mmol/L (ref 101–111)
CO2: 24 mmol/L (ref 22–32)
CREATININE: 1.09 mg/dL (ref 0.61–1.24)
GFR calc non Af Amer: >60 mL/min (ref >60)
GLUCOSE: 103 mg/dL — AB (ref 65–99)
Potassium: 3.5 mmol/L (ref 3.5–5.1)
SODIUM: 140 mmol/L (ref 135–145)
Total Bilirubin: 0.8 mg/dL (ref 0.3–1.2)
Total Protein: 7.7 g/dL (ref 6.5–8.1)

## 2015-10-04 LAB — DIFFERENTIAL
Basophils Absolute: 0 10*3/uL (ref 0.0–0.1)
Basophils Relative: 0 %
EOS ABS: 0 10*3/uL (ref 0.0–0.7)
EOS PCT: 0 %
LYMPHS ABS: 0.8 10*3/uL (ref 0.7–4.0)
LYMPHS PCT: 16 %
MONO ABS: 0.4 10*3/uL (ref 0.1–1.0)
MONOS PCT: 8 %
Neutro Abs: 3.6 10*3/uL (ref 1.7–7.7)
Neutrophils Relative %: 75 %

## 2015-10-04 LAB — CBC
HCT: 16.2 % — ABNORMAL LOW (ref 39.0–52.0)
HEMOGLOBIN: 4.4 g/dL — AB (ref 13.0–17.0)
MCH: 15.3 pg — ABNORMAL LOW (ref 26.0–34.0)
MCHC: 27.2 g/dL — AB (ref 30.0–36.0)
MCV: 56.3 fL — AB (ref 78.0–100.0)
PLATELETS: 681 10*3/uL — AB (ref 150–400)
RBC: 2.88 MIL/uL — AB (ref 4.22–5.81)
RDW: 24 % — AB (ref 11.5–15.5)
WBC: 4.8 10*3/uL (ref 4.0–10.5)

## 2015-10-04 LAB — FOLATE: Folate: 36.9 ng/mL (ref >5.9)

## 2015-10-04 LAB — VITAMIN B12: Vitamin B-12: 315 pg/mL (ref 180–914)

## 2015-10-04 LAB — HEMOGLOBIN AND HEMATOCRIT, BLOOD
HCT: 24.6 % — ABNORMAL LOW (ref 39.0–52.0)
Hemoglobin: 7.2 g/dL — ABNORMAL LOW (ref 13.0–17.0)

## 2015-10-04 LAB — ABO/RH: ABO/RH(D): B POS

## 2015-10-04 LAB — RETICULOCYTES
RBC.: 2.86 MIL/uL — ABNORMAL LOW (ref 4.22–5.81)
RETIC CT PCT: 0.9 % (ref 0.4–3.1)
Retic Count, Absolute: 25.7 10*3/uL (ref 19.0–186.0)

## 2015-10-04 LAB — PREPARE RBC (CROSSMATCH)

## 2015-10-04 LAB — FERRITIN: FERRITIN: 1 ng/mL — AB (ref 24–336)

## 2015-10-04 LAB — SAVE SMEAR

## 2015-10-04 LAB — LACTATE DEHYDROGENASE: LDH: 121 U/L (ref 98–192)

## 2015-10-04 MED ORDER — ASENAPINE MALEATE 5 MG SL SUBL
20.0000 mg | SUBLINGUAL_TABLET | Freq: Every day | SUBLINGUAL | Status: DC
  Administered 2015-10-04 – 2015-10-06 (×3): 20 mg via SUBLINGUAL
  Filled 2015-10-04 (×3): qty 4

## 2015-10-04 MED ORDER — FOLIC ACID 5 MG/ML IJ SOLN
1.0000 mg | Freq: Every day | INTRAMUSCULAR | Status: DC
  Administered 2015-10-04: 1 mg via INTRAVENOUS
  Filled 2015-10-04 (×3): qty 0.2

## 2015-10-04 MED ORDER — ADULT MULTIVITAMIN W/MINERALS CH
1.0000 | ORAL_TABLET | Freq: Every day | ORAL | Status: DC
  Administered 2015-10-04 – 2015-10-07 (×4): 1 via ORAL
  Filled 2015-10-04 (×4): qty 1

## 2015-10-04 MED ORDER — ACETAMINOPHEN 325 MG PO TABS: 650.0000 mg | ORAL_TABLET | Freq: Four times a day (QID) | ORAL | Status: DC | PRN

## 2015-10-04 MED ORDER — VITAMIN B-1 100 MG PO TABS
100.0000 mg | ORAL_TABLET | Freq: Every day | ORAL | Status: DC
  Administered 2015-10-04 – 2015-10-07 (×4): 100 mg via ORAL
  Filled 2015-10-04 (×4): qty 1

## 2015-10-04 MED ORDER — SODIUM CHLORIDE 0.9 % IV SOLN
10.0000 mL/h | Freq: Once | INTRAVENOUS | Status: AC
  Administered 2015-10-04: 10 mL/h via INTRAVENOUS

## 2015-10-04 MED ORDER — QUETIAPINE FUMARATE 300 MG PO TABS
600.0000 mg | ORAL_TABLET | Freq: Every day | ORAL | Status: DC
  Administered 2015-10-04 – 2015-10-06 (×3): 600 mg via ORAL
  Filled 2015-10-04 (×3): qty 2

## 2015-10-04 MED ORDER — ACETAMINOPHEN 650 MG RE SUPP: 650.0000 mg | Freq: Four times a day (QID) | RECTAL | Status: DC | PRN

## 2015-10-04 MED ORDER — SODIUM CHLORIDE 0.9% FLUSH
3.0000 mL | Freq: Two times a day (BID) | INTRAVENOUS | Status: DC
  Administered 2015-10-04 – 2015-10-06 (×4): 3 mL via INTRAVENOUS

## 2015-10-04 MED ORDER — SODIUM CHLORIDE 0.9% FLUSH
3.0000 mL | Freq: Two times a day (BID) | INTRAVENOUS | Status: DC
  Administered 2015-10-04 – 2015-10-06 (×3): 3 mL via INTRAVENOUS

## 2015-10-04 MED ORDER — SODIUM CHLORIDE 0.9 % IV SOLN: 250.0000 mL | INTRAVENOUS | Status: DC | PRN

## 2015-10-04 MED ORDER — SODIUM CHLORIDE 0.9% FLUSH
3.0000 mL | INTRAVENOUS | Status: DC | PRN
  Administered 2015-10-05: 3 mL via INTRAVENOUS
  Filled 2015-10-04: qty 3

## 2015-10-04 NOTE — ED Notes (Signed)
Blood bank called to notify that there are 4 units of blood ready for patient.

## 2015-10-04 NOTE — ED Notes (Signed)
Attempted report 

## 2015-10-04 NOTE — ED Notes (Signed)
Pt here for low hgb; noted to be 4.8

## 2015-10-04 NOTE — ED Provider Notes (Signed)
CSN: [id number]     Arrival date & time 10/04/15  1023 History   First MD Initiated Contact with Patient 10/04/15 1107     Chief Complaint  Patient presents with  . Abnormal Lab    HPI    30 year old male presents today with symptomatic anemia. Pt reports that he has been feeling weak for several years, he attributed this to his antipsychotic medications. He notes that he has become increasing weak while at work and felt as if he was going to pass out. Pt reports having basic labs drawn and told to come to the emergency room. Pt denies any brusing or bleeding, dark or tarry stools, chest pain, abdominal pain, or recent illnesses. He notes no new change in medications stating that he take three medications, with the most recent change approximately two years ago ( Saphris ); seroquel 2003. He does have a history of GERD with recent upper endoscopy/ dilatation  last month ( Dr. Carlean Purl).      Past Medical History  Diagnosis Date  . Achalasia of esophagus   . Depression   . Schizoaffective disorder   . Erosive esophagitis   . GERD (gastroesophageal reflux disease)    Past Surgical History  Procedure Laterality Date  . Heller myotomy  02/2007    with Dor Fundoplication- Dr. Garner Nash [facility])  . Panendoscopy  11/08/2006    with submucosal injection  . Esophagram  08/31/2006  . Panendoscopy  03/09/2006    normal  . Balloon dilation  02/12/2012    Procedure: BALLOON DILATION;  Surgeon: Gatha Mayer, MD;  Location: [facility] ENDOSCOPY;  Service: Endoscopy;  Laterality: N/A;  . Esophagogastroduodenoscopy (egd) with esophageal dilation  08/22/12   Family History  Problem Relation Age of Onset  . Diabetes Father   . Colon cancer Neg Hx   . Stomach cancer Neg Hx    Social History  Substance Use Topics  . Smoking status: Current Every Day Smoker -- 0.50 packs/day for 2 years    Types: Cigarettes, E-cigarettes  . Smokeless tobacco: Current User     Comment: given counseling sheet 01-26-12, pt  started e-sig  . Alcohol Use: No    Review of Systems  All other systems reviewed and are negative.   Allergies  Review of patient's allergies indicates no known allergies.  Home Medications   Prior to Admission medications   Medication Sig Start Date End Date Taking? Authorizing Provider  esomeprazole (NEXIUM) 40 MG capsule Take 1 capsule (40 mg total) by mouth 2 (two) times daily before a meal. 07/27/15  Yes Lori P Hvozdovic, PA-C  QUEtiapine (SEROQUEL) 300 MG tablet Take 600 mg by mouth at bedtime. 06/29/15  Yes Historical Provider, MD  SAPHRIS 10 MG SUBL Take 20 mg by mouth at bedtime. 09/28/15  Yes Historical Provider, MD   BP 121/70 mmHg  Pulse 78  Temp(Src) 98.7 F (37.1 C) (Oral)  Resp 16  SpO2 100% Physical Exam  Constitutional: He is oriented to person, place, and time. He appears well-developed and well-nourished.  HENT:  Head: Normocephalic and atraumatic.  No bleeding  Eyes: Conjunctivae are normal. Pupils are equal, round, and reactive to light. Right eye exhibits no discharge. Left eye exhibits no discharge. No scleral icterus.  Neck: Normal range of motion. No JVD present. No tracheal deviation present.  Pulmonary/Chest: Effort normal. No stridor.  Abdominal: Soft. He exhibits no distension and no mass. There is no tenderness. There is no rebound and no guarding.  Neurological: He  is alert and oriented to person, place, and time. Coordination normal.  Skin: Skin is warm and dry. No rash noted. No erythema. No pallor.  No bruising  Psychiatric: He has a normal mood and affect. His behavior is normal. Judgment and thought content normal.  Nursing note and vitals reviewed.   ED Course  Procedures (including critical care time)  CRITICAL CARE Performed by: Elmer Ramp   Total critical care time: 35 minutes  Critical care time was exclusive of separately billable procedures and treating other patients.  Critical care was necessary to treat or prevent  imminent or life-threatening deterioration.  Critical care was time spent personally by me on the following activities: development of treatment plan with patient and/or surrogate as well as nursing, discussions with consultants, evaluation of patient's response to treatment, examination of patient, obtaining history from patient or surrogate, ordering and performing treatments and interventions, ordering and review of laboratory studies, ordering and review of radiographic studies, pulse oximetry and re-evaluation of patient's condition.  Labs Review Labs Reviewed  COMPREHENSIVE METABOLIC PANEL - Abnormal; Notable for the following:    Glucose, Bld 103 (*)    ALT 14 (*)    All other components within normal limits  CBC - Abnormal; Notable for the following:    RBC 2.88 (*)    Hemoglobin 4.4 (*)    HCT 16.2 (*)    MCV 56.3 (*)    MCH 15.3 (*)    MCHC 27.2 (*)    RDW 24.0 (*)    Platelets 681 (*)    All other components within normal limits  IRON AND TIBC - Abnormal; Notable for the following:    Iron 7 (*)    TIBC 577 (*)    Saturation Ratios 1 (*)    All other components within normal limits  FERRITIN - Abnormal; Notable for the following:    Ferritin 1 (*)    All other components within normal limits  RETICULOCYTES - Abnormal; Notable for the following:    RBC. 2.86 (*)    All other components within normal limits  LACTATE DEHYDROGENASE  VITAMIN B12  FOLATE  DIFFERENTIAL  SAVE SMEAR  URINALYSIS, ROUTINE W REFLEX MICROSCOPIC (NOT AT [facility])  OCCULT BLOOD X 1 CARD TO LAB, STOOL  TYPE AND SCREEN  ABO/RH  PREPARE RBC (CROSSMATCH)    Imaging Review No results found. I have personally reviewed and evaluated these images and lab results as part of my medical decision-making.   EKG Interpretation None      MDM   Final diagnoses:  Anemia, unspecified anemia type  Weakness  Thrombocytosis (HCC)   Labs: Hemoglobin 4.4, MCV 56.3 platelets  681  Imaging:  Consults:  Therapeutics: Packed red blood cells  Discharge Meds:   Assessment/Plan:30 year old male presents today with symptomatic anemia. Uncertain how long this has been going on as patient's symptoms have been attributed to his medication which she's been taking for several years. Noted to have a hemoglobin of 4.4 here in the ED. Blood transfusion ordered, hospital consult for hospital admission. Patient appears to have elevated platelets at 681, MCV of 56.3 showing microcytic anemia, remaining labs pending.        Okey Regal, PA-C 10/04/15 [place], MD 10/05/15 [phone]

## 2015-10-04 NOTE — Consult Note (Signed)
[facility] Consult: 3:30 PM 10/04/2015  LOS: 0 days    Referring Provider: Dr Andria Frames.   Primary Care Physician:  Goes to [facility] urgent [facility] prn.  Primary Gastroenterologist:  Dr. Carlean Purl    Reason for Consultation:  Microcytic anemia.  Hgb 4.4   HPI: [name] is a 30 y.o. male.  Hx schizoaffective d/o.  Hx achalasia. Myotomy in 2008.   08/06/2015 EGD.  For dysphagia in pt s/p s/p heller myotomy and Dorr fundoplication with dysphagia and vomiting, stricture on barium swallow.  Retained food was present in the distal esophagus. Also in stomach. Image was stored via computer. 1,11 A dilatation was found in the total esophagus. Image was stored via computer. A linear erosion was found above the stricture. A dilation was then performed at the distal esophagus using a balloon. Dilator:Balloon 12, 13.5 and 15 mm Good effect and small amount of blood seen. 07/2012 EGD for dysphagia.  Baloon dilation of stricture at [place].  Retained gastric food.  01/2012 EGD for dysphagia.  1) FINDINGS CONSISTENT WITH ACHALASIA (DILATED ESOPHAGUS WITH STENOTIC GE JUNCTION)  2) OTHERWISE NORMAL - 100 U BOTOX INJECTED IN POSTERIOR ASPECT OF LES RATHER THAN CIRCUMFERENTIAL TO TRY TO REDUCE CHANCE OF DISRUPTING ANTERIOR SURGICAL PLANES PER CONVERSATION WITH DR. Garner Nash 2008 EGD with botox injection 2007 EGD normal.    Pt still has dysphagia, and is fearful, thus avoids, of vomiting solid foods.  Before myotomy, he was not able to eat fish, now he is.  For last 4 years pt's diet consists of baked fish, mashed potatoes, no leafy green veggies, no red meat, no vitammin or iron supplements.    Had routine labs Friday through [place], his mental health provider.  He got a call today advising him to go to ED as he was  very anemic.   Hgb is 4.4, MCV is 56.  Platelets fine.  hgb las in 2008 was 13.7.     Pt has BMs 2 to 3 x weekly, no melena or BPR.  Takes Nexium 2 x daily and no heartburn, anorexia, n/v/  Weight stable.  No ETOH, no street drugs.  Smokes E cigarettes.   FOBT not yet done.  Last BM was 4/9.   He notes at least a year of increased fatigue at work at [employer], no DOE, no chest pain, no tachy/palps.  No nose bleeds or unusual bruising.  No previous transfusions or anemia.  No FHx anemia, or SSD  Past Medical History  Diagnosis Date  . Achalasia of esophagus   . Depression   . Schizoaffective disorder   . Erosive esophagitis   . GERD (gastroesophageal reflux disease)     Past Surgical History  Procedure Laterality Date  . Heller myotomy  02/2007    with Dor Fundoplication- Dr. Garner Nash [facility])  . Panendoscopy  11/08/2006    with submucosal injection  . Esophagram  08/31/2006  . Panendoscopy  03/09/2006    normal  . Balloon dilation  02/12/2012    Procedure: BALLOON DILATION;  Surgeon: Ofilia Neas  Carlean Purl, MD;  Location: [facility];  Service: Endoscopy;  Laterality: N/A;  . Esophagogastroduodenoscopy (egd) with esophageal dilation  08/22/12    Prior to Admission medications   Medication Sig Start Date End Date Taking? Authorizing Provider  esomeprazole (NEXIUM) 40 MG capsule Take 1 capsule (40 mg total) by mouth 2 (two) times daily before a meal. 07/27/15  Yes Lori P Hvozdovic, PA-C  QUEtiapine (SEROQUEL) 300 MG tablet Take 600 mg by mouth at bedtime. 06/29/15  Yes Historical Provider, MD  SAPHRIS 10 MG SUBL Take 20 mg by mouth at bedtime. 09/28/15  Yes Historical Provider, MD    Scheduled Meds: . sodium chloride  10 mL/hr Intravenous Once  . Asenapine Maleate  20 mg Oral QHS  . folic acid  1 mg Intravenous Daily  . multivitamin with minerals  1 tablet Oral Daily  . QUEtiapine  600 mg Oral QHS  . sodium chloride flush  3 mL Intravenous Q12H  . sodium chloride flush  3 mL  Intravenous Q12H  . thiamine  100 mg Oral Daily   Infusions:   PRN Meds: sodium chloride, acetaminophen **OR** acetaminophen, sodium chloride flush   Allergies as of 10/04/2015  . (No Known Allergies)    Family History  Problem Relation Age of Onset  . Diabetes Father   . Colon cancer Neg Hx   . Stomach cancer Neg Hx     Social History   Social History  . Marital Status: Single    Spouse Name: N/A  . Number of Children: 0  . Years of Education: N/A   Occupational History  . [employer]    Social History Main Topics  . Smoking status: Current Every Day Smoker -- 0.50 packs/day for 2 years    Types: Cigarettes, E-cigarettes  . Smokeless tobacco: Current User     Comment: given counseling sheet 01-26-12, pt started e-sig  . Alcohol Use: No  . Drug Use: No  . Sexual Activity: Not on file   Other Topics Concern  . Not on file   Social History Narrative    REVIEW OF SYSTEMS: Constitutional:  Per HPI ENT:  No nose bleeds Pulm:  Per HPI CV:  No palpitations, no LE edema.  GU:  No hematuria, no frequency GI:  Per HPI Heme:  Per HPI   Transfusions:  Per HPI.  None before today.  Neuro:  No headaches, no peripheral tingling or numbness Derm:  No itching, no rash or sores.  Endocrine:  No sweats or chills.  No polyuria or dysuria Immunization:  Not queried.  Travel:  None beyond local counties in last few months.    PHYSICAL EXAM: Vital signs in last 24 hours: Filed Vitals:   10/04/15 1457 10/04/15 1500  BP: 117/70 113/65  Pulse: 79 73  Temp: 98.4 F (36.9 C) 98.4 F (36.9 C)  Resp: 15 16   Wt Readings from Last 3 Encounters:  08/06/15 73.936 kg (163 lb)  07/27/15 73.936 kg (163 lb)  03/05/15 72.122 kg (159 lb)    General: pleasant, comfortable AAM. Looks well.  Head:  No asymmetry, trauma or swelling  Eyes:  + conj pallor Ears:  Not HOH  Nose:  No discharge Mouth:  Good dentition.   Neck:  No JVD, no TMG.  No mass Lungs:  Clear bil.    Heart: RRR.  No MRG Abdomen:  Soft, NT, non-obese.  No mass or HSM.  Active BS.   Rectal: smooth, no masses.  Scant  stain of stool is FOBT negative but is an insufficient specimen.    Musc/Skeltl: no joint redness, swelling or deformities Extremities:  No CCE  Neurologic:  Oriented x3.  No tremor.  Moves all 4 limbs. Skin:  No rash, sores or suspicious growths Tattoos:  2 on each arm, professional grade Nodes:  No cervical or inguinal adenopathy   Psych:  Pleasant, calm, cooperative.  Laconic with bland affect, c/w schizoaffective d/o.   Intake/Output from previous day:   Intake/Output this shift:    LAB RESULTS:  Recent Labs  10/04/15 1045  WBC 4.8  HGB 4.4*  HCT 16.2*  PLT 681*   BMET Lab Results  Component Value Date   NA 140 10/04/2015   NA 141 05/13/2009   NA 139 01/17/2007   K 3.5 10/04/2015   K 4.0 05/13/2009   K 4.2 01/17/2007   CL 106 10/04/2015   CL 106 05/13/2009   CL 103 01/17/2007   CO2 24 10/04/2015   CO2 27 05/13/2009   CO2 31 01/17/2007   GLUCOSE 103* 10/04/2015   GLUCOSE 119* 05/13/2009   GLUCOSE 112* 01/17/2007   BUN 13 10/04/2015   BUN 12 05/13/2009   BUN 8 01/17/2007   CREATININE 1.09 10/04/2015   CREATININE 1.27 05/13/2009   CREATININE 1.11 01/17/2007   CALCIUM 9.5 10/04/2015   CALCIUM 10.1 05/13/2009   CALCIUM 9.3 01/17/2007   LFT  Recent Labs  10/04/15 1045  PROT 7.7  ALBUMIN 4.3  AST 16  ALT 14*  ALKPHOS 42  BILITOT 0.8   PT/INR No results found for: INR, PROTIME  Drugs of Abuse     Component Value Date/Time   LABOPIA NEGATIVE 01/17/2007 2130   COCAINSCRNUR NEGATIVE 01/17/2007 2130   LABBENZ NEGATIVE 01/17/2007 2130   AMPHETMU NEGATIVE 01/17/2007 2130     RADIOLOGY STUDIES: No results found.  ENDOSCOPIC STUDIES: Per HPI  IMPRESSION:   *  Microcytic anemia. Essentially unknown baseline as last Hgb was 2008.    No overt GI bleeding, FOBT is pending.  Wonder if this is all due to nutritional, iron deficiency  as none of the foods he eats are sources of iron.   Note his HS psych med Saphris has anemia/agranulocytosis (and incidentally dysphagia!) listed as potential adverse reaction. Thus another potential source.  He's been on this for at least a few years  *  Longstanding achalasia.  Despite 2008 myotomy still has dysphagia and eats only soft foods. Last EGD 2 months ago with dilation of distal esophageal stricture and linear erosion.  Doubt the later is source of bleeding leading to anemia.     PLAN:     *  Per Dr Havery Moros.   *  May be best to have pharmacy calculate iron deficiency and administer parenteral iron.  Given his limited diet, doubt he will be able to tolerate iron containing foods or iron tablets.   *  FOBT test.  Will give Miralax now to encourage BM so spec can be sent to lab.   *  Dysphagia 2 diet, ok for mom to bring foods from home that he likes.  Wont get much he cares to eat on a regular diet    Azucena Freed  10/04/2015, 3:30 PM Pager: [phone]

## 2015-10-04 NOTE — H&P (Signed)
[facility] Admission History and Physical Service Pager: [phone]  Patient name: [name] Medical record number: [id number] Date of birth: December 16, 1985 Age: 30 y.o. Gender: male  Primary Care Provider: No PCP Per Patient Consultants: Gastroenterology Code Status: Full  Chief Complaint: weakness, outpatient abnormal lab result (Hgb 4.4)  Assessment and Plan: [name] is a 30 y.o. male presenting with weakness and dizziness, found to have a Hgb of 4.4. PMH is significant for achalasia and schizoaffective disorder.   #Anemia: Microcytic anemia with Hgb 4.4 on admission, associated with thrombocytosis, increased RDW, retic count <2, normal LDH. Most likely iron deficiency anemia 2/2 chronic GI blood loss, chronic PPI use, and/or iron-deficient diet in setting of achalasia. Thalassemia less likely given normal retic count, elevated RDW, late presentation. No risk factors for sideroblastic, though cannot rule out yet.  - s/p T&S, T&C, 4u PRBCs in ED - GI consulted, to see him this afternoon - Iron panel, B12, folate, peripheral smear pending for anemia workup - Thiamine 100mg  daily, Folic acid 1mg  IV daily - FOBT collected, pending to evaluate for GI source - Urinalysis to evaluate for GU source - Hold PPI - AM CBC, BMP, INR  #Schizoaffective disorder: Stable. No active mood or psychotic sx. Last psychiatric hospitalization 2 years ago.  - Continue home Saphris 10mg  and Seroquel 600mg  qHS  FEN/GI: Regular diet as tolerated, saline lock after PRBCs transfused Prophylaxis: SCDs  Disposition: Requires hospital admission for PRBC transfusion + anemia workup.   History of Present Illness:  [name] is a 30 y.o. male presenting with weakness and dizziness for the past three to four years, worsening over the past year. He recently reported these symptoms to an outpatient provider who did a CBC and sent him to the ED given low hemoglobin. He  states that his weakness is worse in the mornings, especially after taking his antipsychotic medications very late at night. Denies nausea, vomiting, diarrhea. He denies blood in stool, denies melena. He reports severe acid reflux and dysphagia to solids. He describes a strict diet to prevent food from coming back up. He avoids salads, raw food, acidic foods. He does not eat meat or vegetables. He eats primarily fish and mashed potatoes. He does eat soft foods without trouble. He denies dysphagia to liquids. He has no history of anemia that he is aware of. He denies cravings for ice chips, abnormal foods, etc.   Patient reports that he has a PMH of achalasia and GERD for which he takes daily Nexium. He has been taking Nexium for the past year, but was taking Prilosec and Xantac for years prior to that.   Review Of Systems: Per HPI with the following additions: none.  Otherwise the remainder of the systems were negative.  Patient Active Problem List   Diagnosis Date Noted  . Anemia 10/04/2015  . Esophageal dysphagia 02/12/2012  . Esophageal stricture 02/12/2012  . GERD (gastroesophageal reflux disease) 02/12/2012  . ACHALASIA 12/24/2001    Past Medical History: Past Medical History  Diagnosis Date  . Achalasia of esophagus   . Depression   . Schizoaffective disorder   . Erosive esophagitis   . GERD (gastroesophageal reflux disease)     Past Surgical History: Past Surgical History  Procedure Laterality Date  . Heller myotomy  02/2007    with Dor Fundoplication- Dr. Garner [facility])  . Panendoscopy  11/08/2006    with submucosal injection  . Esophagram  08/31/2006  . Panendoscopy  03/09/2006  normal  . Balloon dilation  02/12/2012    Procedure: BALLOON DILATION;  Surgeon: Gatha Mayer, MD;  Location: [facility];  Service: Endoscopy;  Laterality: N/A;  . Esophagogastroduodenoscopy (egd) with esophageal dilation  08/22/12    Social History: Social History  Substance Use Topics   . Smoking status: Current Every Day Smoker -- 0.50 packs/day for 2 years    Types: Cigarettes, E-cigarettes  . Smokeless tobacco: Current User     Comment: given counseling sheet 01-26-12, pt started e-sig  . Alcohol Use: No   Additional social history: Denies recreational drug use. Is requesting a nicotine patch.  Please also refer to relevant sections of EMR.  Family History: Family History  Problem Relation Age of Onset  . Diabetes Father   . Colon cancer Neg Hx   . Stomach cancer Neg Hx    Also family history +HTN.  No fam hx blood disorders. No fam hx sickle cell.   Allergies and Medications: No Known Allergies No current facility-administered medications on file prior to encounter.   Current Outpatient Prescriptions on File Prior to Encounter  Medication Sig Dispense Refill  . esomeprazole (NEXIUM) 40 MG capsule Take 1 capsule (40 mg total) by mouth 2 (two) times daily before a meal. 60 capsule 3  . QUEtiapine (SEROQUEL) 300 MG tablet Take 600 mg by mouth at bedtime.  1  Patient reports that he also takes Saphris daily, unknown dose. He Reports a history of taking prilosec and Xantac.  Reports adverse event (syncope) from Wellbutrin.   Objective: BP 102/75 mmHg  Pulse 78  Temp(Src) 97.9 F (36.6 C) (Oral)  Resp 16  SpO2 100%  Exam: General: Well appearing, AA male sitting up in bed, in no apparent distress, with PRBCs mid-transfusion Eyes: Anincteria, PERRL ENTM: Moist mucous membranes, normal size tongue, no glossitis Neck: No LAD Cardiovascular: RRR, no m/r/g, normal S1/S2 Respiratory: Lungs CTAB, normal WOB Abdomen: +BS, soft, NTND MSK: No bruises, abrasions, deformities. Symmetrical.  Skin: Warm and well perfused. Cap refill <2s. No koilonychia observed.  Neuro: CN II-XII grossly intact Psych: Appropriate mood and affect. Mild psychomotor retardation. Mildly flat affect.   Labs and Imaging: CBC BMET   Recent Labs Lab 10/04/15 1045  WBC 4.8  HGB 4.4*   HCT 16.2*  PLT 681*    Recent Labs Lab 10/04/15 1045  NA 140  K 3.5  CL 106  CO2 24  BUN 13  CREATININE 1.09  GLUCOSE 103*  CALCIUM 9.5     Type & Screen: B positive, negative antibody screen  Type & Cross: Compatible   No imaging.   Quin Hoop, Med Student 10/04/2015, 2:23 PM MS-IV, [place] Intern pager: [phone], text pages welcome  Resident Addendum:  I have seen and evaluated the patient and reviewed and edited the MS4's note above. My separate physical exam, assessment and plan are outlined below:  Physical Exam: Blood pressure 113/65, pulse 73, temperature 98.4 F (36.9 C), temperature source Oral, resp. rate 16, SpO2 100 %. GEN: 30 year old man in NAD, lying in hospital bed HEENT: Conjunctival pallor noted. No scleral icertus. EOMI. CV: RRR, no murmurs noted PULM: CTAB, NWOB GI: S, NT EXT: No edema or clubbing. No cyanosis Neuro: Alert and oriented. Moves all extremities spontaneously Psych: Blunted affect. No SI. No apparent AVH.    Assessment/Plan:  Microcytic Anemia with thrombocytosis. Work up thus far consistent with iron deficiency. (low Fe, low ferritin, elevated TIBC). Likely chronic process, though  patient's last CBC in the chart was in 2008 with normal Hgb. Hemodynamically stable. No signs of acute bleed. Thrombocytosis likely reactive in setting of anemia. Differential includes occult GI bleed vs insufficient dietary intake, or combination of both. - Type and screened in ED, transfused 4U pRBC in ED - Folate, B12 pending - Peripheral smear pending - FOBT pending - Check UA for hematuria - Check INR - Trend CBC - GI consulted, appreciate assistance  Dysphagia / Achalasia / Erosive esophagitis. Is currently followed by [facility], had EGD 2 months ago. Diet poor in iron content likely contributing to above. - Hold PPI for now given iron deficiency anemia, may need to be restarted pending GI recs - Consider  nutrition consult  Schizoaffective disorder. Stable - Continue seroquel and saphris  Caleb M. Jerline Pain, [place] Resident PGY-2 10/04/2015 4:06 PM

## 2015-10-04 NOTE — ED Notes (Signed)
This RN spoke with Main lab who reports that the only lab tube that needs to be collected & sent is a gold tube, the remaining labs can be added on, gold tube collected & sent to main lab

## 2015-10-05 DIAGNOSIS — K228 Other specified diseases of esophagus: Secondary | ICD-10-CM

## 2015-10-05 DIAGNOSIS — R531 Weakness: Secondary | ICD-10-CM | POA: Insufficient documentation

## 2015-10-05 LAB — BASIC METABOLIC PANEL
ANION GAP: 11 (ref 5–15)
BUN: 9 mg/dL (ref 6–20)
CO2: 24 mmol/L (ref 22–32)
Calcium: 9.2 mg/dL (ref 8.9–10.3)
Chloride: 105 mmol/L (ref 101–111)
Creatinine, Ser: 1 mg/dL (ref 0.61–1.24)
GLUCOSE: 96 mg/dL (ref 65–99)
POTASSIUM: 3.9 mmol/L (ref 3.5–5.1)
Sodium: 140 mmol/L (ref 135–145)

## 2015-10-05 LAB — PROTIME-INR
INR: 1.11 (ref 0.00–1.49)
Prothrombin Time: 14.5 seconds (ref 11.6–15.2)

## 2015-10-05 LAB — CBC
HEMATOCRIT: 21.4 % — AB (ref 39.0–52.0)
Hemoglobin: 6.8 g/dL — CL (ref 13.0–17.0)
MCH: 20.1 pg — ABNORMAL LOW (ref 26.0–34.0)
MCHC: 31.8 g/dL (ref 30.0–36.0)
MCV: 63.1 fL — AB (ref 78.0–100.0)
Platelets: 559 10*3/uL — ABNORMAL HIGH (ref 150–400)
RBC: 3.39 MIL/uL — AB (ref 4.22–5.81)
RDW: 29.4 % — ABNORMAL HIGH (ref 11.5–15.5)
WBC: 5.3 10*3/uL (ref 4.0–10.5)

## 2015-10-05 LAB — URINALYSIS, ROUTINE W REFLEX MICROSCOPIC
Bilirubin Urine: NEGATIVE
GLUCOSE, UA: NEGATIVE mg/dL
HGB URINE DIPSTICK: NEGATIVE
Ketones, ur: NEGATIVE mg/dL
Leukocytes, UA: NEGATIVE
Nitrite: NEGATIVE
PH: 6 (ref 5.0–8.0)
Protein, ur: NEGATIVE mg/dL
SPECIFIC GRAVITY, URINE: 1.009 (ref 1.005–1.030)

## 2015-10-05 LAB — HEMOGLOBIN AND HEMATOCRIT, BLOOD
HCT: 26 % — ABNORMAL LOW (ref 39.0–52.0)
HEMOGLOBIN: 7.9 g/dL — AB (ref 13.0–17.0)

## 2015-10-05 LAB — GLUCOSE, CAPILLARY: GLUCOSE-CAPILLARY: 110 mg/dL — AB (ref 65–99)

## 2015-10-05 LAB — PREPARE RBC (CROSSMATCH)

## 2015-10-05 LAB — OCCULT BLOOD X 1 CARD TO LAB, STOOL: Fecal Occult Bld: NEGATIVE

## 2015-10-05 MED ORDER — PANTOPRAZOLE SODIUM 40 MG PO TBEC
40.0000 mg | DELAYED_RELEASE_TABLET | Freq: Every day | ORAL | Status: DC
  Administered 2015-10-05 – 2015-10-07 (×3): 40 mg via ORAL
  Filled 2015-10-05 (×2): qty 1

## 2015-10-05 MED ORDER — BISACODYL 5 MG PO TBEC
10.0000 mg | DELAYED_RELEASE_TABLET | Freq: Four times a day (QID) | ORAL | Status: AC
  Administered 2015-10-05 (×2): 10 mg via ORAL
  Filled 2015-10-05 (×3): qty 2

## 2015-10-05 MED ORDER — PEG-KCL-NACL-NASULF-NA ASC-C 100 G PO SOLR: 1.0000 | Freq: Once | ORAL | Status: DC

## 2015-10-05 MED ORDER — PEG-KCL-NACL-NASULF-NA ASC-C 100 G PO SOLR
0.5000 | Freq: Once | ORAL | Status: AC
  Administered 2015-10-05: 100 g via ORAL
  Filled 2015-10-05: qty 1

## 2015-10-05 MED ORDER — FERUMOXYTOL INJECTION 510 MG/17 ML
510.0000 mg | Freq: Once | INTRAVENOUS | Status: AC
  Administered 2015-10-05: 510 mg via INTRAVENOUS
  Filled 2015-10-05: qty 17

## 2015-10-05 MED ORDER — PEG-KCL-NACL-NASULF-NA ASC-C 100 G PO SOLR
0.5000 | Freq: Once | ORAL | Status: AC
  Administered 2015-10-06: 100 g via ORAL

## 2015-10-05 MED ORDER — SODIUM CHLORIDE 0.9 % IV SOLN
Freq: Once | INTRAVENOUS | Status: AC
  Administered 2015-10-05: 06:00:00 via INTRAVENOUS

## 2015-10-05 NOTE — Progress Notes (Signed)
          Daily Rounding Note  10/05/2015, 10:49 AM  LOS: 1 day   SUBJECTIVE:       No complaints.  Formed brown stool this AM  OBJECTIVE:         Vital signs in last 24 hours:    Temp:  [97.6 F (36.4 C)-99.3 F (37.4 C)] 98.2 F (36.8 C) (04/11 0935) Pulse Rate:  [67-89] 78 (04/11 0935) Resp:  [11-20] 20 (04/11 0935) BP: (102-133)/(44-78) 105/55 mmHg (04/11 0935) SpO2:  [98 %-100 %] 99 % (04/11 0935) Last BM Date: 10/02/15 There were no vitals filed for this visit. General: looks well   Heart: RRR Chest: clear  Abdomen: soft, Nt.  Active BS  Extremities: no CCE Neuro/Psych:  Pleasant, laconic, cooperative.  No gross deficits.   Intake/Output from previous day: 04/10 0701 - 04/11 0700 In: 1300 [P.O.:600; Blood:700] Out: 1480 [Urine:1480]  Intake/Output this shift: Total I/O In: 915 [P.O.:580; Blood:335] Out: 400 [Urine:400]  Lab Results:  Recent Labs  10/04/15 1045 10/04/15 2044 10/05/15 0428  WBC 4.8  --  5.3  HGB 4.4* 7.2* 6.8*  HCT 16.2* 24.6* 21.4*  PLT 681*  --  559*   BMET  Recent Labs  10/04/15 1045 10/05/15 0428  NA 140 140  K 3.5 3.9  CL 106 105  CO2 24 24  GLUCOSE 103* 96  BUN 13 9  CREATININE 1.09 1.00  CALCIUM 9.5 9.2   LFT  Recent Labs  10/04/15 1045  PROT 7.7  ALBUMIN 4.3  AST 16  ALT 14*  ALKPHOS 42  BILITOT 0.8   PT/INR  Recent Labs  10/05/15 0428  LABPROT 14.5  INR 1.11   Hepatitis Panel No results for input(s): HEPBSAG, HCVAB, HEPAIGM, HEPBIGM in the last 72 hours.  Studies/Results: No results found.  ASSESMENT:   * Microcytic anemia. Essentially unknown baseline as last Hgb was 2008.  No overt GI bleeding, FOBT is pending. Wonder if this is all due to nutritional, iron deficiency as none of the foods he eats are sources of iron.  Note his HS psych med Saphris has anemia/agranulocytosis (and incidentally dysphagia!) listed as potential adverse  reaction. Thus another potential source. He's been on this for at least a few years FOBT negative.   Ferritin 1.   * Longstanding achalasia. Despite 2008 myotomy still has dysphagia and eats only soft foods. Last EGD 2 months ago with dilation of distal esophageal stricture and linear erosion. Doubt the later is source of bleeding leading to anemia.     PLAN   *  Colonoscopy and possible enteroscopy tomorrow.  Per Dr Havery Moros.  Pt agreeable.   * probably needs another unit PRBC and ask that attending team address this as well as the parenteral iron (pharm consult)    Azucena Freed  10/05/2015, 10:49 AM Pager: [phone]

## 2015-10-05 NOTE — Progress Notes (Signed)
CRITICAL VALUE ALERT  Critical value received: Hgb 6.8  Date of notification:  10/05/2015  Time of notification: [id number]   Critical value read back:Yes.    Nurse who received alert: Job Founds, RN   MD notified (1st page):  Juleen China, DO  Time of first page:  535  Responding MD:  Juleen China, DO   Time MD responded:  537

## 2015-10-05 NOTE — Progress Notes (Signed)
Family Medicine Teaching Service Daily Progress Note Intern Pager: [phone]  Patient name: [name] Medical record number: [id number] Date of birth: 11/27/1985 Age: 30 y.o. Gender: male  Primary Care Provider: No PCP Per Patient Consultants: GI Code Status: Full  Pt Overview and Major Events to Date:  4/10: Admission with Hgb 4.4, received 4u PRBCs > Hgb 7.2, GI consult 4/11: Early AM Hgb 6.8 > transfused 1u PRBCs  Assessment and Plan: [name] is a 30 y.o. male presenting with weakness and fatigue with minimal exertion, found to have profound symptomatic anemia with Hgb of 4.4. PMH is significant for achalasia and schizoaffective disorder.   #Iron deficiency anemia: Insidious onset. Microcytic, hypochromic with very low ferritin, low iron, and elevated TIBC consistent with iron deficiency. Retic count <2. B12 and folate wnl. PT/INR wnl. Patient has long-standing history of GERD and achalasia, s/p stricture dilation in Feb 2017. High likelihood of GI blood loss, though no gross melena or blood in stools. Pt also with very limited diet given dysphagia sx, so possible nutritional deficiency as cause or contributor.  - s/p 1u PRBCs early this AM given Hgb 6.8, H&H pending (5u PRBCs total) - GI consult: Goal for prep and colonoscopy today pending Hgb >7, pt status - Restart PPI given risk of upper GI blood loss. Less concern for anemia 2/2 PPI given normal B12 and folate.  - IV Iron transfusion (Feraheme) 510 mg today, wait >30 minutes after PRBC transfusion given both have risk of hypersensitivity  - Smear read pending, UA pending, FOBT pending  #Schizoaffective disorder: Stable.  - GI recommendation to consider discontinuation of Saphris given risk of agranulocytosis. At this time believe risk outweighs benefits and will continue Saphris for now. - Continue Seroquel 600 qHS  FEN/GI: Clear liquid diet, saline lock PPx: SCDs  Disposition: Pending anemia workup (colonoscopy  when possible, may require EGD after) and hemoglobin stabilization, source unknown at this time  Subjective:  Patient without acute events overnight. Tolerated additional PRBCs without side effects. He reports continued fatigue but states that he feels improved from yesterday and at his baseline compared to the past several weeks. Denies CP, SOB. Denies N/V/D/abdominal pain. He is tolerating clear liquid diet without sx of dysphagia.   Objective: Temp:  [97.6 F (36.4 C)-99.3 F (37.4 C)] 98.5 F (36.9 C) (04/11 [id number]) Pulse Rate:  [67-100] 67 (04/11 0633) Resp:  [11-20] 20 (04/11 0633) BP: (102-133)/(44-78) 114/65 mmHg (04/11 0633) SpO2:  [98 %-100 %] 100 % (04/11 [id number])  Physical Exam: General: Lying in bed, awake but tired and flat, psychomotor slowing Cardiovascular: RRR, nml S1/S2, no m/r/g Respiratory: Lungs CTAB, no wheezes or crackles noted Abdomen: Soft, +BS, NTND Extremities: Warm and well perfused, no edema  Laboratory:  Recent Labs Lab 10/04/15 1045 10/04/15 2044 10/05/15 0428  WBC 4.8  --  5.3  HGB 4.4* 7.2* 6.8*  HCT 16.2* 24.6* 21.4*  PLT 681*  --  559*    Recent Labs Lab 10/04/15 1045 10/05/15 0428  NA 140 140  K 3.5 3.9  CL 106 105  CO2 24 24  BUN 13 9  CREATININE 1.09 1.00  CALCIUM 9.5 9.2  PROT 7.7  --   BILITOT 0.8  --   ALKPHOS 42  --   ALT 14*  --   AST 16  --   GLUCOSE 103* 96   Iron studies: Iron 7, Fe 1, TIBC 577, UIBC 570, saturation ratio 1 B12: 315 Folate: 36.9 PT/INR: 14.5/1.11 Retic Ct 0.9  Smear read pending UA pending FOBT pending  Imaging/Diagnostic Tests: None.  Quin Hoop, Med Student 10/05/2015, 7:18 AM MS4, [place] Intern pager: [phone], text pages welcome   RESIDENT ADDENDUM  I have separately seen and examined the patient. I have discussed the findings and exam with the medical student and agree with the above note, which I have edited appropriately. I helped develop the  management plan that is described in the student's note, and I agree with the content.  Additionally I have outlined my exam and assessment/plan below:   PE:  Gen: Laying in bed, NAD CV: RRR, no m/r/g Pulm: CTAB, no w/r/c, normal WOB Ext: WWP, no edema Psych: flat affect  A/P:  Iron deficiency anemia: Hgb improving s/p 4 units pRBCs.  - receiving 5th unit pRBCs currently - f/u post-transfusion H&H - f/u GI recs - colonoscopy soon pending Hgb - f/u smear, FOBT - Consider IV iron prior to discharge  Schizoaffective disorder: Stable - continue home Saphris and Seroquel   Virginia Crews, MD PGY-2,  [place] [facility] 10/05/2015  8:59 AM

## 2015-10-06 ENCOUNTER — Encounter (HOSPITAL_COMMUNITY)
Admission: EM | Disposition: A | Discharge status: Home / Self Care | Payer: Self-pay | Source: Home / Self Care | Attending: Family Medicine

## 2015-10-06 ENCOUNTER — Encounter (HOSPITAL_COMMUNITY): Payer: Self-pay | Admitting: *Deleted

## 2015-10-06 DIAGNOSIS — F200 Paranoid schizophrenia: Secondary | ICD-10-CM | POA: Insufficient documentation

## 2015-10-06 DIAGNOSIS — K222 Esophageal obstruction: Secondary | ICD-10-CM

## 2015-10-06 DIAGNOSIS — R531 Weakness: Secondary | ICD-10-CM

## 2015-10-06 DIAGNOSIS — K648 Other hemorrhoids: Secondary | ICD-10-CM

## 2015-10-06 HISTORY — PX: COLONOSCOPY: SHX5424

## 2015-10-06 HISTORY — PX: ESOPHAGOGASTRODUODENOSCOPY: SHX5428

## 2015-10-06 LAB — CBC
HEMATOCRIT: 27 % — AB (ref 39.0–52.0)
Hemoglobin: 8.4 g/dL — ABNORMAL LOW (ref 13.0–17.0)
MCH: 20.5 pg — ABNORMAL LOW (ref 26.0–34.0)
MCHC: 31.1 g/dL (ref 30.0–36.0)
MCV: 66 fL — AB (ref 78.0–100.0)
Platelets: 537 10*3/uL — ABNORMAL HIGH (ref 150–400)
RBC: 4.09 MIL/uL — AB (ref 4.22–5.81)
RDW: 30 % — AB (ref 11.5–15.5)
WBC: 4.3 10*3/uL (ref 4.0–10.5)

## 2015-10-06 SURGERY — COLONOSCOPY
Anesthesia: Moderate Sedation

## 2015-10-06 MED ORDER — SODIUM CHLORIDE 0.9 % IV SOLN
INTRAVENOUS | Status: DC
Start: 2015-10-06 — End: 2015-10-06
  Administered 2015-10-06: 500 mL via INTRAVENOUS

## 2015-10-06 MED ORDER — DIPHENHYDRAMINE HCL 50 MG/ML IJ SOLN
INTRAMUSCULAR | Status: DC | PRN
  Administered 2015-10-06 (×2): 25 mg via INTRAVENOUS

## 2015-10-06 MED ORDER — FENTANYL CITRATE (PF) 100 MCG/2ML IJ SOLN
INTRAMUSCULAR | Status: DC | PRN
  Administered 2015-10-06 (×4): 25 ug via INTRAVENOUS

## 2015-10-06 MED ORDER — MIDAZOLAM HCL 5 MG/5ML IJ SOLN
INTRAMUSCULAR | Status: DC | PRN
  Administered 2015-10-06 (×5): 2 mg via INTRAVENOUS

## 2015-10-06 MED ORDER — MIDAZOLAM HCL 5 MG/ML IJ SOLN
INTRAMUSCULAR | Status: AC
  Filled 2015-10-06: qty 2

## 2015-10-06 MED ORDER — DIPHENHYDRAMINE HCL 50 MG/ML IJ SOLN
INTRAMUSCULAR | Status: AC
  Filled 2015-10-06: qty 1

## 2015-10-06 MED ORDER — FENTANYL CITRATE (PF) 100 MCG/2ML IJ SOLN
INTRAMUSCULAR | Status: AC
  Filled 2015-10-06: qty 4

## 2015-10-06 MED ORDER — FOLIC ACID 1 MG PO TABS
1.0000 mg | ORAL_TABLET | Freq: Every day | ORAL | Status: DC
  Administered 2015-10-06 – 2015-10-07 (×2): 1 mg via ORAL
  Filled 2015-10-06 (×2): qty 1

## 2015-10-06 MED ORDER — ENSURE ENLIVE PO LIQD
237.0000 mL | Freq: Two times a day (BID) | ORAL | Status: DC
  Administered 2015-10-07: 237 mL via ORAL

## 2015-10-06 NOTE — Progress Notes (Signed)
Initial Nutrition Assessment  DOCUMENTATION CODES:   Not applicable  INTERVENTION:  -Once diet advanced, offer Ensure Enlive BID. Each supplement provides 350 kcals and 20 grams of protein.  -Continue to monitor nutritional needs.  NUTRITION DIAGNOSIS:   Inadequate oral intake related to other (see comment), vomiting, nausea (limited diet ) as evidenced by per patient/family report.  GOAL:   Patient will meet greater than or equal to 90% of their needs  MONITOR:   PO intake, Supplement acceptance, Labs, Weight trends, Skin, I & O's  REASON FOR ASSESSMENT:   Consult Assessment of nutrition requirement/status  ASSESSMENT:   30 yo male with chronic esophageal reflux and previous stricture dilation presents with insidious onset of weakness and fatique with minimal exertion. Had blood work last week as an outpatient. Called today with profound anemia and told to go to ER. No gross bleeding or black stools. His symptoms have progressed over the last few weeks to fatique with minimal exertion. No noted cardiopulmonary history. Denies NSAIDs, ASA and EtOH. At one point over a year ago, told had low iron and started on oral replacement. He quit because of GI nausea. Issues  Pt seen for MST. Per chart, pt has not experienced any recent weight loss. Per chart, pt weighs 163 lbs and pt reports weighing around 160 for the past 2-3 years.   Pt reports eating BID since last procedure in 2/17. Pt eats grits and eggs for breakfast and baked fish, mashed potatoes, and gravy for second meal. Sometimes pt eats chicken noodle soup for second meal. Pt eats applesauce for snacks. Pt does not eat red meats or leafy greens. Pt reports not eating chicken, hard bread, and chips because they cause N/V. Pt takes a fish oil supplement at home. Pt reports eating a very limited diet every day because he knows that it will not upset his stomach. Pt states he would drink Ensure once diet is advanced. Pt  reports willingness to add in foods to his diet that will help with the anemia but is unsure of what to add at this time. Intern discussed foods rich in iron with pt.  NFPE: unable to assess because pt was agitated and no others were present in room at time of visit. Visually assessed pt's upper extremities and face. Pt seems to be well nourished.   Labs reviewed; CBG 110 mg/dl, iron 7 ug/dl, hemoglobin 8.4 g/dl.  Meds reviewed; Folic acid 1 mg, MVI, B1 100 mg.  Diet Order:  Diet NPO time specified Except for: Sips with Meds  Skin:  Reviewed, no issues  Last BM:  4/12  Height:   Ht Readings from Last 1 Encounters:  08/06/15 5' 7.5" (1.715 m)    Weight:   Wt Readings from Last 1 Encounters:  08/06/15 163 lb (73.936 kg)    Ideal Body Weight:     BMI:  There is no weight on file to calculate BMI.  Estimated Nutritional Needs:   Kcal:  [id number] (24-26 kcals/kg)  Protein:  80-90 g (1.1 g/kg)  Fluid:  1.7-1.9 L  EDUCATION NEEDS:   No education needs identified at this time  Geoffery Lyons, [place] [facility]etic Intern Pager [phone]

## 2015-10-06 NOTE — Op Note (Signed)
[name] Patient Name: [name] Procedure Date : 10/06/2015 MRN: [id number] Attending MD: Carlota Raspberry. Derrill Bagnell MD, MD Date of Birth: 24-Apr-1986 CSN: [id number] Age: 30 Admit Type: Inpatient Procedure:                Upper GI endoscopy Indications:              Iron deficiency anemia, history of treated                            achalasia s/p myotomy Providers:                Carlota Raspberry. Shadaya Marschner MD, MD, Kingsley Plan, RN,                            Despina Pole, Technician Referring MD:              Medicines:                Fentanyl 25 micrograms IV, Midazolam 2 mg IV Complications:            No immediate complications. Estimated blood loss:                            Minimal. Estimated Blood Loss:     Estimated blood loss was minimal. Procedure:                Pre-Anesthesia Assessment:                           - Prior to the procedure, a History and Physical                            was performed, and patient medications and                            allergies were reviewed. The patient's tolerance of                            previous anesthesia was also reviewed. The risks                            and benefits of the procedure and the sedation                            options and risks were discussed with the patient.                            All questions were answered, and informed consent                            was obtained. Prior Anticoagulants: The patient has                            taken no previous anticoagulant or antiplatelet  agents. ASA Grade Assessment: III - A patient with                            severe systemic disease. After reviewing the risks                            and benefits, the patient was deemed in                            satisfactory condition to undergo the procedure.                           After obtaining informed consent, the endoscope was   passed under direct vision. Throughout the                            procedure, the patient's blood pressure, pulse, and                            oxygen saturations were monitored continuously. The                            [id number]) scope was introduced through the                            mouth, and advanced to the second part of duodenum.                            The upper GI endoscopy was accomplished without                            difficulty. The patient tolerated the procedure                            well. Scope In: Scope Out: Findings:      The lumen of the esophagus was severely dilated consistent with known       diagnosis of achalasia. There was some retained secretions in the       esophagus which were suctioned, but no retained food.      One mild benign-appearing, intrinsic stenosis was found and was easily       traversed with the endoscope. Dilation was not performed today given his       symptoms are minimal and retained food noted in the stomach.      A medium amount of residual food was found in the gastric fundus and in       the gastric body, precluding visualization in these areas, visualized       was poor on retroflexion and the exam was quickly finished upon noticing       this finding.      The exam of the distal stomach was otherwise normal.      The duodenal bulb and second portion of the duodenum were normal. Impression:               - Dilation in the esophagus with residual  secretions consistent with known achalasia.                           - Benign-appearing mild esophageal stenosis, easily                            traversed, not dilated given residual food noted in                            the stomach                           - A medium amount of food (residue) in the stomach                            prohibiting views in this portion of the stomach                            however previously  examined on EGD a few months ago                            and normal.                           - Normal distal half of the stomach                           - Normal duodenal bulb and second portion of the                            duodenum.                           Overall, no pathology noted to cause the patient's                            iron deficiency on this exam Moderate Sedation:      Moderate (conscious) sedation was administered by the endoscopy nurse       and supervised by the endoscopist. The following parameters were       monitored: oxygen saturation, heart rate, blood pressure, and response       to care. Total physician intraservice time was 30 minutes for EGD and       colonoscopy. Recommendation:           - Return patient to hospital ward for ongoing care.                           - Resume previous diet.                           - Continue present medications.                           - Recommend IV iron infusion while inpatient                           -  Given FOBT negative, unclear if anemia is due to                            a nutritional deficiency (very restricted diet) or                            medication reaction (Saphris has been reported to                            rarely cause blood dyscrasias). He has no active GI                            blood loss and no etiology on EGD and colonoscopy.                            Recommend hematology consultation for further                            evaluation, ensure no potential medication reaction                            attributed to his medications prior to assuming                            nutritional deficiency                           - Following IV iron infusion and hematology                            consult, he is stable from GI perspective for                            discharge. We can consider capsule endoscopy in the                            future however with FOBT  negative this may be                            likely of low yield. Will await response to IV iron Procedure Code(s):        --- Professional ---                           [phone], Esophagogastroduodenoscopy, flexible,                            transoral; diagnostic, including collection of                            specimen(s) by brushing or washing, when performed                            (separate procedure)  [id number], Moderate sedation services provided by the                            same physician or other qualified health care                            professional performing the diagnostic or                            therapeutic service that the sedation supports,                            requiring the presence of an independent trained                            observer to assist in the monitoring of the                            patient's level of consciousness and physiological                            status; initial 15 minutes of intraservice time,                            patient age 31 years or older                           [phone], Moderate sedation services; each additional                            15 minutes intraservice time Diagnosis Code(s):        --- Professional ---                           K22.8, Other specified diseases of esophagus                           K22.2, Esophageal obstruction                           D50.9, Iron deficiency anemia, unspecified CPT copyright 2016 [employer]. All rights reserved. The codes documented in this report are preliminary and upon coder review may  be revised to meet current compliance requirements. Remo Lipps P. Adda Stokes MD, MD 10/06/2015 5:02:25 PM This report has been signed electronically. Number of Addenda: 0

## 2015-10-06 NOTE — Progress Notes (Signed)
Family Medicine Teaching Service Daily Progress Note Intern Pager: [phone]  Patient name: [name] Medical record number: [id number] Date of birth: 04/09/1986 Age: 30 y.o. Gender: male  Primary Care Provider: No PCP Per Patient Consultants: Gastroenterology Code Status: Full  Pt Overview and Major Events to Date:  4/10: Admission with Hgb 4.4, received 4u PRBCs > Hgb 7.2, GI consult 4/11: Early AM Hgb 6.8 > transfused 1u PRBCs 4/12: Plan for colonoscopy +/- enteroscopy  Assessment and Plan: [name] is a 30 y.o. male presenting with weakness and fatigue with minimal exertion, found to have profound symptomatic anemia with Hgb of 4.4. PMH is significant for achalasia and schizoaffective disorder.   #Iron deficiency anemia: Classic iron deficiency panel. FOBT negative. UA negative. With sign hx of GERD and achalasia, s/p stricture dilation Feb 2017. Likely cause GIB vs nutritional deficiency. Hgb 4.4 on admission, now s/p 5 units PRBCs. Last Hgb 7.9. No additional PRBCs required last 24h. Feraheme transfusion yesterday. - GI consult: GI prep last night, for colonoscopy +/- enteroscopy today - Continue protonix  - Smear pending  #Schizoaffective disorder: Stable.  - GI recommendation to consider discontinuation of Saphris given risk of anemia/agranulocytosis. At this time believe this is unlikely cause and risk outweighs benefits. Will continue Saphris pending results of colonoscopy.  - Continue Seroquel 600 qHS  FEN/GI: NPO, saline lock PPx: SCDs  Disposition: Pending anemia workup (colonoscopy +/- enteroscopy) and hemoglobin stabilization, source unknown at this time  Subjective:  Patient without acute events overnight, able to complete Movi-Prep for colonoscopy this AM. Tolerated IV feraheme without side effects. He denies fatigue, dizziness. Denies CP, SOB. Denies N/V/D/abdominal pain. He is asymptomatic at this time.   Objective: Temp:  [94.8 F (34.9 C)-98.7  F (37.1 C)] 94.8 F (34.9 C) (04/12 0541) Pulse Rate:  [73-78] 77 (04/12 0541) Resp:  [18] 18 (04/12 0541) BP: (105-122)/(53-64) 122/64 mmHg (04/12 0541) SpO2:  [97 %-99 %] 97 % (04/12 0541)  Physical Exam: General: Sitting up in bed for blood draw, awake, flat affect Cardiovascular: RRR, nml S1/S2, no m/r/g Respiratory: Lungs CTAB, no wheezes or crackles noted Abdomen: Soft, +BS, NTND Extremities: Warm and well perfused, no edema  Laboratory:  Recent Labs Lab 10/04/15 1045 10/04/15 2044 10/05/15 0428 10/05/15 1601  WBC 4.8  --  5.3  --   HGB 4.4* 7.2* 6.8* 7.9*  HCT 16.2* 24.6* 21.4* 26.0*  PLT 681*  --  559*  --     Recent Labs Lab 10/04/15 1045 10/05/15 0428  NA 140 140  K 3.5 3.9  CL 106 105  CO2 24 24  BUN 13 9  CREATININE 1.09 1.00  CALCIUM 9.5 9.2  PROT 7.7  --   BILITOT 0.8  --   ALKPHOS 42  --   ALT 14*  --   AST 16  --   GLUCOSE 103* 96   UA: snl, no signs of infection or dehydration FOBT: negative  Imaging/Diagnostic Tests: None.  Quin Hoop, Med Student 10/06/2015, 9:36 AM MS-IV, [place] Intern pager: [phone], text pages welcome  Resident Addendum  I have seen and evaluated the patient and reviewed the MS4's note above. My separate physical exam, assessment and plan are outlined below:  Physical Exam: Blood pressure 122/64, pulse 77, temperature 94.8 F (34.9 C), temperature source Oral, resp. rate 18, SpO2 97 %. GEN: 30 year old male, lying in bed in NAD CV: RRR, no murmurs PULM: CTAB, NWOB GI: S, NT, ND EXT:  No cyanosis or edema  Assessment/Plan:  Iron Deficiency Anemia. Hgb stable. S/p 5U pRBC. - CBC this morning pending - Smear pending - GI consulted, will have c-scope today - Will need IV iron prior to discharge  Schizoaffective Disorder - Continue home saphris and seroquel  [name] Pain, [name] Resident PGY-2 10/06/2015 9:59 AM

## 2015-10-06 NOTE — Op Note (Addendum)
[facility] Patient Name: [name] Procedure Date : 10/06/2015 MRN: [id number] Attending MD: Carlota Raspberry. Armbruster MD, MD Date of Birth: December 21, 1985 CSN: [id number] Age: 30 Admit Type: Inpatient Procedure:                Colonoscopy Indications:              severe iron deficiency anemia Providers:                Remo Lipps P. Armbruster MD, MD, Kingsley Plan, RN,                            Despina Pole, Technician Referring MD:              Medicines:                Monitored Anesthesia Care Complications:            No immediate complications. Estimated blood loss:                            None. Estimated Blood Loss:     Estimated blood loss: none. Procedure:                Pre-Anesthesia Assessment:                           - Prior to the procedure, a History and Physical                            was performed, and patient medications and                            allergies were reviewed. The patient's tolerance of                            previous anesthesia was also reviewed. The risks                            and benefits of the procedure and the sedation                            options and risks were discussed with the patient.                            All questions were answered, and informed consent                            was obtained. Prior Anticoagulants: The patient has                            taken no previous anticoagulant or antiplatelet                            agents. ASA Grade Assessment: III - A patient with  severe systemic disease. After reviewing the risks                            and benefits, the patient was deemed in                            satisfactory condition to undergo the procedure.                           After obtaining informed consent, the colonoscope                            was passed under direct vision. Throughout the                            procedure, the patient's  blood pressure, pulse, and                            oxygen saturations were monitored continuously. The                            [id number] scope was introduced through                            the anus and advanced to the the terminal ileum,                            with identification of the appendiceal orifice and                            IC valve. The colonoscopy was performed without                            difficulty. The patient tolerated the procedure                            well. The quality of the bowel preparation was                            adequate. The terminal ileum, ileocecal valve,                            appendiceal orifice, and rectum were photographed. Scope In: 4:00:46 PM Scope Out: 4:24:25 PM Scope Withdrawal Time: 0 hours 14 minutes 9 seconds  Total Procedure Duration: 0 hours 23 minutes 39 seconds  Findings:      The perianal and digital rectal examinations were normal.      Non-bleeding internal hemorrhoids were found.      The terminal ileum appeared normal.      The exam was otherwise without abnormality. The colonic mucosa was       normal without polyps, mass lesions, or inflammatory changes.       Retroflexed views of the rectum not obtained due to narrow rectal vault. Impression:               -  Non-bleeding internal hemorrhoids.                           - The examined portion of the ileum was normal.                           - The examination was otherwise normal.                           - Overall, no pathology noted to cause iron                            deficiency on colonoscopy. Moderate Sedation:      no moderate sedation Recommendation:           - Proceed with upper endoscopy                           - Further recommendations in upper endoscopy note.                           - No repeat colonoscopy. Procedure Code(s):        --- Professional ---                           [phone], Colonoscopy, flexible;  diagnostic, including                            collection of specimen(s) by brushing or washing,                            when performed (separate procedure) Diagnosis Code(s):        --- Professional ---                           K64.8, Other hemorrhoids                           D50.9, Iron deficiency anemia, unspecified CPT copyright 2016 American [employer] Association. All rights reserved. The codes documented in this report are preliminary and upon coder review may  be revised to meet current compliance requirements. Remo Lipps P. Armbruster MD, MD 10/06/2015 4:30:41 PM This report has been signed electronically. Number of Addenda: 1 Addendum Number: 1   Addendum Date: 10/06/2015 4:40:12 PM      In regards to sedation, MAC was not used for this case. The patient was       sedated with moderate sedation, 78mcg Fentanyl IV, 8mg  versed IV, and       50mg  of Benadryl Steven P. Armbruster MD, MD 10/06/2015 4:41:05 PM This report has been signed electronically.

## 2015-10-07 ENCOUNTER — Encounter (HOSPITAL_COMMUNITY): Payer: Self-pay | Admitting: Gastroenterology

## 2015-10-07 LAB — CBC
HCT: 26.4 % — ABNORMAL LOW (ref 39.0–52.0)
HEMOGLOBIN: 8.1 g/dL — AB (ref 13.0–17.0)
MCH: 20.6 pg — AB (ref 26.0–34.0)
MCHC: 30.7 g/dL (ref 30.0–36.0)
MCV: 67 fL — ABNORMAL LOW (ref 78.0–100.0)
Platelets: 480 10*3/uL — ABNORMAL HIGH (ref 150–400)
RBC: 3.94 MIL/uL — AB (ref 4.22–5.81)
RDW: 30.9 % — ABNORMAL HIGH (ref 11.5–15.5)
WBC: 4.3 10*3/uL (ref 4.0–10.5)

## 2015-10-07 LAB — PATHOLOGIST SMEAR REVIEW

## 2015-10-07 MED ORDER — SODIUM CHLORIDE 0.9 % IV SOLN: 510.0000 mg | Freq: Once | INTRAVENOUS | Status: DC

## 2015-10-07 MED ORDER — POLYSACCHARIDE IRON COMPLEX 150 MG PO CAPS: 150.0000 mg | ORAL_CAPSULE | Freq: Every day | ORAL | Status: DC

## 2015-10-07 MED ORDER — POLYSACCHARIDE IRON COMPLEX 150 MG PO CAPS
150.0000 mg | ORAL_CAPSULE | Freq: Every day | ORAL | Status: DC
  Administered 2015-10-07: 150 mg via ORAL
  Filled 2015-10-07: qty 1

## 2015-10-07 NOTE — Progress Notes (Signed)
Nsg Discharge Note  Admit Date:  10/04/2015 Discharge date: 10/07/2015   Danuel Burkes to be D/C'd Home per MD order.  AVS completed.  Copy for chart, and copy for patient signed, and dated. Patient/caregiver able to verbalize understanding.  Discharge Medication:   Medication List    TAKE these medications        esomeprazole 40 MG capsule  Commonly known as:  NEXIUM  Take 1 capsule (40 mg total) by mouth 2 (two) times daily before a meal.     iron polysaccharides 150 MG capsule  Commonly known as:  NIFEREX  Take 1 capsule (150 mg total) by mouth daily.     QUEtiapine 300 MG tablet  Commonly known as:  SEROQUEL  Take 600 mg by mouth at bedtime.     SAPHRIS 10 MG Subl  Generic drug:  Asenapine Maleate  Take 20 mg by mouth at bedtime.        Discharge Assessment: Filed Vitals:   10/06/15 2121 10/07/15 0621  BP: 110/83 106/49  Pulse: 63 57  Temp: 97.6 F (36.4 C) 97.6 F (36.4 C)  Resp: 18 16   Skin clean, dry and intact without evidence of skin break down, no evidence of skin tears noted. IV catheter discontinued intact. Site without signs and symptoms of complications - no redness or edema noted at insertion site, patient denies c/o pain - only slight tenderness at site.  Dressing with slight pressure applied.  D/c Instructions-Education: Discharge instructions given to patient/family with verbalized understanding. D/c education completed with patient/family including follow up instructions, medication list, d/c activities limitations if indicated, with other d/c instructions as indicated by MD - patient able to verbalize understanding, all questions fully answered. Patient instructed to return to ED, call 911, or call MD for any changes in condition.  Patient escorted via Agency, and D/C home via private auto.  Dayle Points, RN 10/07/2015 2:43 PM

## 2015-10-07 NOTE — Discharge Summary (Signed)
[facility] Discharge Summary  Patient name: [name] Medical record number: [id number] Date of birth: 1985-11-28 Age: 30 y.o. Gender: male Date of Admission: 10/04/2015  Date of Discharge: 10/07/2015 Admitting Physician: Zenia Resides, MD  Primary Care Provider: No PCP Per Patient Consultants: Gastroenterology, nutrition  Indication for Hospitalization: Profound significant anemia of unknown cause  Discharge Diagnoses/Problem List:  1. Iron deficiency anemia, likely nutritional 2. Achalasia 3. GERD 4. Schizoaffective disorder 5. Establish Care with PCP  Disposition: Home with self  Discharge Condition: Stable  Discharge Exam:  Filed Vitals:   10/06/15 2121 10/07/15 0621  BP: 110/83 106/49  Pulse: 63 57  Temp: 97.6 F (36.4 C) 97.6 F (36.4 C)  Resp: 18 16   General: Sitting up in bed for blood, dressed in home clothes, much more alert than prior days Cardiovascular: RRR, nml S1/S2, no m/r/g Respiratory: Lungs CTAB, no wheezes or crackles noted Abdomen: Soft, +BS, NTND Extremities: Warm and well perfused, no edema  Brief Hospital Course:  30 yo male with h/o achalasia and schizoaffective disorder who presented with outpatient lab value Hgb 4.4 and symptoms of fatigue. Patient received 4u PRBCs in the ED and GI was consulted. Iron panel classic for iron deficiency anemia, UA negative for blood, FOBT negative. His post-tranfusion H&H showed Hgb 6.8 overnight, so pt received 1u additional PRBCs. We gave him one dose IV Feraheme 510mg , well tolerated. Hgb improved to 8.4 so GI performed a colonoscopy and upper endoscopy on HOD#3 which showed no signs of lower or upper GIB. Given pt report that he eats no red meat, vegetables, or citris due to poor tolerability in setting of chronic achalasia and GERD, our primary explanation for anemia is nutritional deficiency at this time. Pt was seen by nutrition who provided recs and he was sent home with  additional information. He was sent home with new prescription for Niferox daily iron supplements given h/o poor tolerance to ferrous sulfate.   Of note, GI was concerned that patient's antipsychotic Saphris could be cause of his anemia given rare side effect of blood dyscrasias. We elected to continue this medication given clear signs of iron deficiency and pt's long hx of failure with alternate antipsychotic medications. His schizoaffective disorder remained stable throughout hospitalization and he was maintained on his home regimen.   Issues for Follow Up:  1. New Patient: Pt without PCP, to establish care.  2. Iron deficiency anemia: Repeat CBC on f/u. Check on tolerability of iron supplementation. Can consider hem/onc outpatient follow-up if diet adjustment does not result in improvement. Can also consider further iron influsions as pt only received one dose in hospital. GI also to consider capsule endoscopy if no improvement with iron and diet.  3. Achalasia/nutrition: Given that pt's iron deficiency is likely 2/2 poor diet, we provided dietary recommendations. He will need nutritional follow-up regarding iron-containing foods that he can tolerate. Could consider repeat dilation if achalasia worsens.  4. Schizoaffective disorder: Pt will need close f/u with his psychiatrist to discuss risks and benefits of adjusting antipsychotic regimen. GI concerned that his Saphris is cause of anemia, though less concerning to [place] given that pt has clear iron deficiency. Could consider this if diet adjustment does not result in hemoglobin stabilization.  Significant Procedures:  Colonoscopy:  - Non-bleeding internal hemorrhoids. - The examined portion of the ileum was normal. - The examination was otherwise normal. - Overall, no pathology noted to cause iron deficiency on colonoscopy.  Upper Endoscopy:  -  Dilation in the esophagus with residual secretions consistent with known achalasia. -  Benign-appearing mild esophageal stenosis, easily traversed, not dilated given residual food noted in the stomach - A medium amount of food (residue) in the stomach prohibiting views in this portion of the stomach however previously examined on EGD a few months ago and normal. - Normal distal half of the stomach - Normal duodenal bulb and second portion of the duodenum. Overall, no pathology noted to cause the patient's iron deficiency on this exam  Significant Labs and Imaging:   Recent Labs Lab 10/05/15 0428 10/05/15 1601 10/06/15 0844 10/07/15 0504  WBC 5.3  --  4.3 4.3  HGB 6.8* 7.9* 8.4* 8.1*  HCT 21.4* 26.0* 27.0* 26.4*  PLT 559*  --  537* 480*    Recent Labs Lab 10/04/15 1045 10/05/15 0428  NA 140 140  K 3.5 3.9  CL 106 105  CO2 24 24  GLUCOSE 103* 96  BUN 13 9  CREATININE 1.09 1.00  CALCIUM 9.5 9.2  ALKPHOS 42  --   AST 16  --   ALT 14*  --   ALBUMIN 4.3  --     Results/Tests Pending at Time of Discharge: Smear read pending  Discharge Medications:    Medication List    TAKE these medications        esomeprazole 40 MG capsule  Commonly known as:  NEXIUM  Take 1 capsule (40 mg total) by mouth 2 (two) times daily before a meal.     iron polysaccharides 150 MG capsule  Commonly known as:  NIFEREX  Take 1 capsule (150 mg total) by mouth daily.     QUEtiapine 300 MG tablet  Commonly known as:  SEROQUEL  Take 600 mg by mouth at bedtime.     SAPHRIS 10 MG Subl  Generic drug:  Asenapine Maleate  Take 20 mg by mouth at bedtime.        Discharge Instructions: Please refer to Patient Instructions section of EMR for full details.  Patient was counseled important signs and symptoms that should prompt return to medical care, changes in medications, dietary instructions, activity restrictions, and follow up appointments.   Follow-Up Appointments: Follow-up Information    Follow up with Phill Myron, MD. Schedule an appointment as soon as possible for a  visit on 10/12/2015.   Specialty:  Family Medicine   Why:  For hospital follow-up, appointment at 10:30am   Contact information:   [place] [address] [phone]       Schedule an appointment as soon as possible for a visit with your psychiatrist.   Why:  For hospital follow-up       Virginia Crews, MD MPH 10/07/2015, 1:56 PM PGY-2, [place]

## 2015-10-07 NOTE — Progress Notes (Signed)
Family Medicine Teaching Service Daily Progress Note Intern Pager: [phone]  Patient name: [name] Medical record number: [id number] Date of birth: [birth year] Age: 30 y.o. Gender: male  Primary Care Provider: No PCP Per Patient Consultants: Gastroenterology, nutrition Code Status: Full  Pt Overview and Major Events to Date:  4/10: Admission with Hgb 4.4, received 4u PRBCs > Hgb 7.2, GI consult 4/11: Early AM Hgb 6.8 > transfused 1u PRBCs 4/12: Colonoscopy + EGD, no signs of GIB 4/13: IV feraheme, discharge  Assessment and Plan: [name] is a 30 y.o. male presenting with weakness and fatigue with minimal exertion, found to have profound symptomatic anemia with Hgb of 4.4. PMH is significant for achalasia and schizoaffective disorder.   #Iron deficiency anemia: Classic iron deficiency panel. FOBT negative. UA negative. EGD + colonoscopy negative for GIB. With significant hx of GERD and achalasia, s/p stricture dilation Feb 2017. Most likely cause is nutritional deficiency given negative anemia w/u and pt with poor diet (seen by nutrition). Hgb 4.4 on admission, s/p 5 units PRBCs, 510mg  feraheme, now Hgb 8.1.  - 2nd dose feraheme 510mg  infusion today - Continue protonix  - Smear read pending  #Schizoaffective disorder: Stable.  - GI recommendation to consider discontinuation of Saphris given risk of anemia/agranulocytosis. At this time believe this is unlikely cause given iron deficiency. Also, pt with multiple antipsychotic medication failures in past, so risk outweighs benefits to stop this medication given stability and alternate cause.  - Continue Seroquel 600 qHS, Saphris 20mg  - Outpatient psychiatry f/u to discuss possible medication adjustment  FEN/GI: Regular diet, saline lock PPx: SCDs  Disposition: Discharge home today with outpatient f/u  Subjective:  Patient without acute events overnight, able to complete Movi-Prep for colonoscopy this AM. Tolerated IV  feraheme without side effects. He denies fatigue, dizziness. Denies CP, SOB. Denies N/V/D/abdominal pain. He is asymptomatic at this time.   Objective: Temp:  [97.6 F (36.4 C)-98 F (36.7 C)] 97.6 F (36.4 C) (04/13 0621) Pulse Rate:  [56-122] 57 (04/13 0621) Resp:  [0-27] 16 (04/13 0621) BP: (106-134)/(49-93) 106/49 mmHg (04/13 0621) SpO2:  [98 %-100 %] 99 % (04/13 0621) Weight:  [72.122 kg (159 lb)] 72.122 kg (159 lb) (04/12 1447)  Physical Exam: General: Sitting up in bed for blood, dressed in home clothes, much more alert than prior days Cardiovascular: RRR, nml S1/S2, no m/r/g Respiratory: Lungs CTAB, no wheezes or crackles noted Abdomen: Soft, +BS, NTND Extremities: Warm and well perfused, no edema  Laboratory:  Recent Labs Lab 10/05/15 0428 10/05/15 1601 10/06/15 0844 10/07/15 0504  WBC 5.3  --  4.3 4.3  HGB 6.8* 7.9* 8.4* 8.1*  HCT 21.4* 26.0* 27.0* 26.4*  PLT 559*  --  537* 480*    Recent Labs Lab 10/04/15 1045 10/05/15 0428  NA 140 140  K 3.5 3.9  CL 106 105  CO2 24 24  BUN 13 9  CREATININE 1.09 1.00  CALCIUM 9.5 9.2  PROT 7.7  --   BILITOT 0.8  --   ALKPHOS 42  --   ALT 14*  --   AST 16  --   GLUCOSE 103* 96    Imaging/Diagnostic Tests: Colonoscopy:  - Non-bleeding internal hemorrhoids. - The examined portion of the ileum was normal. - The examination was otherwise normal. - Overall, no pathology noted to cause iron deficiency on colonoscopy.  Upper Endoscopy:  - Dilation in the esophagus with residual secretions consistent with known achalasia. - Benign-appearing mild esophageal stenosis, easily traversed, not  dilated given residual food noted in the stomach - A medium amount of food (residue) in the stomach prohibiting views in this portion of the stomach however previously examined on EGD a few months ago and normal. - Normal distal half of the stomach - Normal duodenal bulb and second portion of the duodenum. Overall, no pathology  noted to cause the patient's iron deficiency on this exam  [name], Med Student 32/13/2017, 9:07 AM MS-IV, [place] Intern pager: [phone], text pages welcome  RESIDENT ADDENDUM  I have separately seen and examined the patient. I have discussed the findings and exam with the medical student and agree with the above note, which I have edited appropriately. I helped develop the management plan that is described in the student's note, and I agree with the content.  Additionally I have outlined my exam and assessment/plan below:   PE:  Gen: sitting in bedside chair, NAD CV: RRR, no m/r/g Pulm: CTAB, no w/r/c. Normal WOB GI: Soft, NTND +BS Ext: WWP, no edema  A/P:  Iron Deficiency Anemia. Hgb stable. S/p 5U pRBC. - Smear pending - GI consulted, normal colonoscopy - Will need IV iron prior to discharge today - nutrition to provide diet info  Schizoaffective Disorder - Continue home saphris and seroquel   Virginia Crews, MD PGY-2,  [facility] 10/07/2015  9:25 AM

## 2015-10-07 NOTE — Plan of Care (Signed)
Problem: Food- and Nutrition-Related Knowledge Deficit (NB-1.1) Goal: Nutrition education Formal process to instruct or train a patient/client in a skill or to impart knowledge to help patients/clients voluntarily manage or modify food choices and eating behavior to maintain or improve health. Outcome: Adequate for Discharge Nutrition Education Note  RD consulted for nutrition education regarding anemia. MD reported pt family requesting additional education prior to discharge.   RD provided "Iron Rich Nutrition Therapy" handout from the Academy of Nutrition and Dietetics. Reviewed patient's dietary recall. Provided examples on ways to increasesodium intake in diet. Encouraged pt to consume foods high in vitamin C with iron rich foods to increase iron absorption.    RD discussed why it is important for patient to adhere to diet recommendations. Teach back method used.  Expect fair to poor compliance.  Body mass index is 22.19 kg/(m^2). Pt meets criteria for normal weight range based on current BMI.  Current diet order is regular, patient is consuming approximately 50% of meals at this time. Labs and medications reviewed.   Najmah Carradine A. Jimmye Norman, RD, LDN, CDE Pager: [phone] After hours Pager: [phone]

## 2015-10-07 NOTE — Care Management Note (Signed)
Case Management Note  Patient Details  Name: [name] MRN: [id number] Date of Birth: [birth year]  Subjective/Objective:                 Patient admitted with anemia, had transfusion and colonoscopy. Patient lives with mother. Usually goes to urgent care on [address]. Would like to establish PCP with Family Medicine. Is covered with Medicaid.    Action/Plan:  Anticipate DC to home today.  Expected Discharge Date:                  Expected Discharge Plan:  Home/Self Care  In-House Referral:     Discharge planning Services  CM Consult  Post Acute Care Choice:    Choice offered to:     DME Arranged:    DME Agency:     HH Arranged:    [place] Agency:     Status of Service:  Completed, signed off  Medicare Important Message Given:    Date Medicare IM Given:    Medicare IM give by:    Date Additional Medicare IM Given:    Additional Medicare Important Message give by:     If discussed at [place] of Stay Meetings, dates discussed:    Additional Comments:  Carles Collet, RN 10/07/2015, 11:16 AM

## 2015-10-07 NOTE — Discharge Instructions (Signed)
You were admitted to the hospital due to anemia. We gave you 5 units of blood to correct this, as well as iron infusions. We determined that your anemia is likely due to iron deficiency. Given that the most common cause of iron deficiency is bleeding, the GI doctors did a colonoscopy and endoscopy to look for a possible source of bleeding. They saw no signs of bleeding, and so we believe that your iron deficiency is due to your diet. We recommend that you increase iron-containing food in your diet and take daily iron supplements. We have set up for you to follow up with our [facility].   If you become weak, fatigued, short of breath, or develop chest pain, it is possible that you have become anemic again. Seek medical care if this occurs. Also, if you see blood in your stool or black stools, it is possible that you have bleeding in your GI tract and you should seek medical care.   Dietary Recommendations to increased IRON

## 2015-10-08 LAB — TYPE AND SCREEN
ABO/RH(D): B POS
ANTIBODY SCREEN: NEGATIVE
UNIT DIVISION: 0
Unit division: 0
Unit division: 0
Unit division: 0

## 2015-10-12 ENCOUNTER — Ambulatory Visit (INDEPENDENT_AMBULATORY_CARE_PROVIDER_SITE_OTHER): Payer: Medicaid Other | Admitting: Family Medicine

## 2015-10-12 ENCOUNTER — Encounter: Payer: Self-pay | Admitting: Family Medicine

## 2015-10-12 ENCOUNTER — Telehealth: Payer: Self-pay | Admitting: Internal Medicine

## 2015-10-12 VITALS — BP 120/80 | HR 90 | Temp 98.3°F | Ht 71.0 in | Wt 153.0 lb

## 2015-10-12 DIAGNOSIS — K220 Achalasia of cardia: Secondary | ICD-10-CM | POA: Diagnosis not present

## 2015-10-12 DIAGNOSIS — K2100 Gastro-esophageal reflux disease with esophagitis, without bleeding: Secondary | ICD-10-CM

## 2015-10-12 DIAGNOSIS — D649 Anemia, unspecified: Secondary | ICD-10-CM

## 2015-10-12 LAB — POCT HEMOGLOBIN: HEMOGLOBIN: 9.3 g/dL — AB (ref 14.1–18.1)

## 2015-10-12 NOTE — Assessment & Plan Note (Addendum)
Continue Nexium. Reflux likely secondary to Achalasia - Advised to make an appointment with gastroenterology for reflux; He will call if he needs referral

## 2015-10-12 NOTE — Progress Notes (Signed)
   Subjective:    Patient ID: [name], male    DOB: 1986/04/29, 30 y.o.   MRN: [id number]  Seen for Hospital follow-up visit for   CC: Anemia  He continues to report some reflux of food, but denies CP, shortness of breath, fatigue, Reports that he is doing much better since his discharge from the hospital.   Taking iron supplements, denies constipation.  He has yet to make an appointment with gastroenterology for follow-up for Reflux.   Smoking history noted Review of Systems   See HPI for ROS. Objective:  BP 120/80 mmHg  Pulse 90  Temp(Src) 98.3 F (36.8 C) (Oral)  Ht 5\' 11"  (1.803 m)  Wt 153 lb (69.4 kg)  BMI 21.35 kg/m2  SpO2 99%  General: NAD Cardiac: RRR, normal heart sounds, no murmurs. 2+ radial and PT pulses bilaterally Respiratory: CTAB, normal effort Extremities: no edema or cyanosis. WWP. Skin: warm and dry, no rashes noted    Assessment & Plan:   Anemia Hemoglobin improved today - Continue iron supplementation - Follow-up in one month with PCP; can consider hematology referral if anemia not responding to iron  GERD (gastroesophageal reflux disease) Continue Nexium. Reflux likely secondary to Achalasia - Advised to make an appointment with gastroenterology for reflux; He will call if he needs referral

## 2015-10-12 NOTE — Telephone Encounter (Signed)
See recent hospital encounter and procedure reports by Dr. Havery Moros.  Patient c/o continued dysphagia.  Please advise next step

## 2015-10-12 NOTE — Assessment & Plan Note (Signed)
Hemoglobin improved today - Continue iron supplementation - Follow-up in one month with PCP; can consider hematology referral if anemia not responding to iron

## 2015-10-12 NOTE — Patient Instructions (Signed)
Continue taking your iron supplements as prescribed.  - Continue taking Nexium for your heartburn. Call and make an appointment with, your gastroenterologist.  Let us know if you need a new referral.

## 2015-10-13 NOTE — Telephone Encounter (Signed)
Patient notified of the recommendations He will contact Dr. Joesph Fillers office today reviewed a full liquid diet with him

## 2015-10-13 NOTE — Telephone Encounter (Signed)
I was under the impression he was going back to see his achalasia surgeon[facility]. [facility] at [facility] after his prior problems - I had called Dr. Garner Nash and he was supposed to have his staff arrange an appointment and likely dilation to help dysphagia - does not look like that happened.  Please ask Nakul about that - he should stay on a full liquid diet at this time.

## 2015-10-13 NOTE — Telephone Encounter (Signed)
Patient is able to eat soft foods today, but yesterday not able to keep any food down yesterday.  Dr. Carlean Purl please review recent hospital notes and procedure and advise next step.

## 2015-10-13 NOTE — Telephone Encounter (Signed)
Pt calling back and wants to know what the next step is going to  be

## 2015-10-14 ENCOUNTER — Telehealth: Payer: Self-pay | Admitting: Family Medicine

## 2015-10-14 DIAGNOSIS — K222 Esophageal obstruction: Secondary | ICD-10-CM

## 2015-10-14 DIAGNOSIS — K220 Achalasia of cardia: Secondary | ICD-10-CM

## 2015-10-14 NOTE — Telephone Encounter (Signed)
Will forward to MD to place referral for GI. Jazmin Hartsell,CMA  

## 2015-10-14 NOTE — Telephone Encounter (Signed)
Mom called and would like to be informed when we ok the referral to [facility].  Per Tia (ref coordinator) we will fax request and they will call mom for appointment. LMOVM for mom (yvonne) informing that we are sending the referral today.  Daysy Santini, Salome Spotted, CMA

## 2015-10-14 NOTE — Telephone Encounter (Signed)
Need referral to [facility] for endoscopy.  Dr. Abran Richard is the provider he want to see there.

## 2015-10-14 NOTE — Telephone Encounter (Signed)
Referral placed.  Algis Greenhouse. Jerline Pain, [place] Resident PGY-2 10/14/2015 3:04 PM

## 2015-11-03 ENCOUNTER — Other Ambulatory Visit: Payer: Self-pay | Admitting: Family Medicine

## 2015-11-03 NOTE — Telephone Encounter (Signed)
Rx filled.  [name]. Jerline Pain, [place] [facility] Resident PGY-2 11/03/2015 2:05 PM

## 2015-11-05 ENCOUNTER — Ambulatory Visit (INDEPENDENT_AMBULATORY_CARE_PROVIDER_SITE_OTHER): Payer: Medicaid Other | Admitting: Family Medicine

## 2015-11-05 ENCOUNTER — Encounter: Payer: Self-pay | Admitting: Family Medicine

## 2015-11-05 VITALS — BP 124/75 | HR 99 | Temp 98.4°F | Ht 71.0 in | Wt 156.7 lb

## 2015-11-05 DIAGNOSIS — D649 Anemia, unspecified: Secondary | ICD-10-CM | POA: Diagnosis not present

## 2015-11-05 LAB — CBC
HCT: 37.2 % — ABNORMAL LOW (ref 38.5–50.0)
HEMOGLOBIN: 11.8 g/dL — AB (ref 13.2–17.1)
MCH: 24.5 pg — AB (ref 27.0–33.0)
MCHC: 31.7 g/dL — ABNORMAL LOW (ref 32.0–36.0)
MCV: 77.3 fL — ABNORMAL LOW (ref 80.0–100.0)
Platelets: 425 10*3/uL — ABNORMAL HIGH (ref 140–400)
RBC: 4.81 MIL/uL (ref 4.20–5.80)
WBC: 6.4 10*3/uL (ref 3.8–10.8)

## 2015-11-05 LAB — POCT HEMOGLOBIN: HEMOGLOBIN: 11.6 g/dL — AB (ref 14.1–18.1)

## 2015-11-05 NOTE — Assessment & Plan Note (Signed)
POC hgb 11.6 today. CBC sent. Will continue iron supplementation with ferrex 150mg  daily. Follow up in 2-3 months. Has follow up with GI next week.

## 2015-11-05 NOTE — Patient Instructions (Signed)
Your hemoglobin today is 11.6. Please continue the iron pills.  Please come back in 2-3 months for your next check in, or sooner if you need anything.  Take care,  Dr Jerline Pain

## 2015-11-05 NOTE — Progress Notes (Signed)
    Subjective:  [name] is a 30 y.o. male who presents to the [facility] today with a chief complaint of iron deficiency anemia follow up.   HPI:  Anemia Patient admitted last month with severe iron deficiency anemia (hgb 4.4), thought likely to be secondary to nutritional deficiency. Received pRBC and IV iron while admitted. Discharged home on oral iron supplements. Seen in clinic 3 weeks ago and had Hgb of 9.3.  Since then has been taking iron pills. No chest pain or shortness of breath. No side effects from oral iron.  Has been on liquid diet for past few weeks per GI recs. Will be seeing GI next week. More energy.   ROS: Per HPI  Objective:  Physical Exam: BP 124/75 mmHg  Pulse 99  Temp(Src) 98.4 F (36.9 C) (Oral)  Ht 5\' 11"  (1.803 m)  Wt 156 lb 11.2 oz (71.079 kg)  BMI 21.87 kg/m2  Gen: NAD, resting comfortably CV: RRR with no murmurs appreciated Pulm: NWOB, CTAB with no crackles, wheezes, or rhonchi MSK: no edema, cyanosis, or clubbing noted Skin: warm, dry Neuro: grossly normal, moves all extremities Psych: Blunted affect. Normal thought content.   Assessment/Plan:  Anemia POC hgb 11.6 today. CBC sent. Will continue iron supplementation with ferrex 150mg  daily. Follow up in 2-3 months. Has follow up with GI next week.   Algis Greenhouse. Jerline Pain, [place] [facility] Resident PGY-2 11/05/2015 2:50 PM

## 2015-11-08 ENCOUNTER — Encounter: Payer: Self-pay | Admitting: Family Medicine

## 2015-12-22 ENCOUNTER — Telehealth: Payer: Self-pay | Admitting: Internal Medicine

## 2015-12-22 MED ORDER — ESOMEPRAZOLE MAGNESIUM 40 MG PO CPDR: 40.0000 mg | DELAYED_RELEASE_CAPSULE | Freq: Two times a day (BID) | ORAL | Status: DC

## 2015-12-22 NOTE — Telephone Encounter (Signed)
Refill sent in as requested. 

## 2015-12-23 ENCOUNTER — Other Ambulatory Visit: Payer: Self-pay

## 2015-12-23 MED ORDER — ESOMEPRAZOLE MAGNESIUM 40 MG PO CPDR: 40.0000 mg | DELAYED_RELEASE_CAPSULE | Freq: Two times a day (BID) | ORAL | Status: DC

## 2016-01-13 ENCOUNTER — Encounter: Payer: Self-pay | Admitting: Internal Medicine

## 2016-01-17 ENCOUNTER — Telehealth: Payer: Self-pay | Admitting: Internal Medicine

## 2016-01-17 NOTE — Telephone Encounter (Signed)
Patient with a history of achalasia.  He is having dysphagia to solids.  Recent EGD with Dr. Havery Moros in April.  Please advise next step.

## 2016-01-19 NOTE — Telephone Encounter (Signed)
Patient recently had surgery with Dr,. Westcott on 11/25/15.  Reported to Dr. Carlean Purl.  He wants patient to return to Dr. Garner Nash at [facility].  Patient notified of recommendations.

## 2016-01-19 NOTE — Telephone Encounter (Signed)
I thought he was going back to Dr. Garner Nash of surgery at [facility] but does not look like he did - please clarify  If he is not going to go back to Dr. Garner Nash I can add him on and dilate him again [place] should be ok not sure I can get him in this week - he needs clear liquids day before   Could also do at hospital week of 8/7 if needed

## 2016-01-31 ENCOUNTER — Telehealth: Payer: Self-pay | Admitting: Internal Medicine

## 2016-01-31 NOTE — Telephone Encounter (Signed)
Per last phone note patient was to return to Dr. Garner Nash due to recent surgery with him.  Patient has not contacted Dr. Garner Nash.  He is again encouraged to let Dr. Garner Nash know what is going on.

## 2016-02-01 ENCOUNTER — Telehealth: Payer: Self-pay | Admitting: Internal Medicine

## 2016-02-01 NOTE — Telephone Encounter (Signed)
Patient's mother notified that he will need to call Dr. Joesph Fillers office.  I did give her the contact numbers.  She understands to call him, he recently had surgery with Dr. Garner Nash.  And will need eval with him prior to returning to Dr. Carlean Purl

## 2016-02-03 ENCOUNTER — Other Ambulatory Visit: Payer: Self-pay | Admitting: Family Medicine

## 2016-02-25 ENCOUNTER — Ambulatory Visit (INDEPENDENT_AMBULATORY_CARE_PROVIDER_SITE_OTHER): Payer: Medicaid Other | Admitting: Family Medicine

## 2016-02-25 VITALS — BP 117/71 | HR 106 | Temp 98.3°F | Ht 71.0 in | Wt 158.6 lb

## 2016-02-25 DIAGNOSIS — Z23 Encounter for immunization: Secondary | ICD-10-CM | POA: Diagnosis present

## 2016-02-25 DIAGNOSIS — D509 Iron deficiency anemia, unspecified: Secondary | ICD-10-CM | POA: Diagnosis not present

## 2016-02-25 DIAGNOSIS — Z114 Encounter for screening for human immunodeficiency virus [HIV]: Secondary | ICD-10-CM | POA: Diagnosis not present

## 2016-02-25 LAB — CBC
HCT: 42.7 % (ref 38.5–50.0)
HEMOGLOBIN: 14.3 g/dL (ref 13.2–17.1)
MCH: 26.9 pg — ABNORMAL LOW (ref 27.0–33.0)
MCHC: 33.5 g/dL (ref 32.0–36.0)
MCV: 80.3 fL (ref 80.0–100.0)
MPV: 9.2 fL (ref 7.5–12.5)
PLATELETS: 327 10*3/uL (ref 140–400)
RBC: 5.32 MIL/uL (ref 4.20–5.80)
RDW: 16.9 % — ABNORMAL HIGH (ref 11.0–15.0)
WBC: 3 10*3/uL — ABNORMAL LOW (ref 3.8–10.8)

## 2016-02-25 LAB — IRON AND TIBC
%SAT: 30 % (ref 15–60)
IRON: 102 ug/dL (ref 50–180)
TIBC: 342 ug/dL (ref 250–425)
UIBC: 240 ug/dL (ref 125–400)

## 2016-02-25 LAB — HIV ANTIBODY (ROUTINE TESTING W REFLEX): HIV 1&2 Ab, 4th Generation: NONREACTIVE

## 2016-02-25 NOTE — Patient Instructions (Signed)
We will check your iron levels again today. Please continue taking the iron. We will let you know how much longer you should take this.  We will give you a flu shot today.  Please plan on coming back to see me in 3-6 months.  Take care,  Dr Jerline Pain

## 2016-02-25 NOTE — Progress Notes (Signed)
   Subjective:  [name] is a 30 y.o. male who presents to the [facility] today with a chief complaint of anemia follow up.   HPI:  Anemia Patient with history of severe iron deficiency anemia, secondary to nutritional deficiency. Was admitted 5 months ago for this and received pRBC and IV iron. Has been on oral iron supplements, which he has been tolerating well.  He still is having issues with eating solids. He is currently seeing GI for this. He currently drinks ensure in the morning and drinks soup for lunch and dinner. He had a procedure performed on June 1 which helped for a period of time, however was back to his baseline after about 4 weeks. Will be going back to see GI soon.   ROS: Per HPI  Objective:  Physical Exam: BP 117/71 (BP Location: Left Arm, Patient Position: Sitting, Cuff Size: Normal)   Pulse (!) 106   Temp 98.3 F (36.8 C) (Oral)   Ht 5\' 11"  (1.803 m)   Wt 158 lb 9.6 oz (71.9 kg)   SpO2 100%   BMI 22.12 kg/m   Gen: NAD, resting comfortably CV: RRR with no murmurs appreciated Pulm: NWOB, CTAB with no crackles, wheezes, or rhonchi GI: Normal bowel sounds present. Soft, Nontender, Nondistended. MSK: no edema, cyanosis, or clubbing noted Skin: warm, dry Neuro: grossly normal, moves all extremities Psych: Normal affect and thought content  Assessment/Plan:  Anemia Will check CBC and iron panel today. Follow up in 2-3 months. If iron stores are repleted, can move to maintenance dosing of iron supplementation. Has follow up with GI soon.    Algis Greenhouse. Jerline Pain, [place] Resident PGY-3 02/25/2016 12:14 PM

## 2016-02-25 NOTE — Assessment & Plan Note (Signed)
Will check CBC and iron panel today. Follow up in 2-3 months. If iron stores are repleted, can move to maintenance dosing of iron supplementation. Has follow up with GI soon.

## 2016-02-26 LAB — FERRITIN: FERRITIN: 15 ng/mL — AB (ref 20–345)

## 2016-02-29 ENCOUNTER — Encounter: Payer: Self-pay | Admitting: Family Medicine

## 2016-03-03 ENCOUNTER — Other Ambulatory Visit: Payer: Self-pay | Admitting: Family Medicine

## 2016-03-28 ENCOUNTER — Ambulatory Visit (INDEPENDENT_AMBULATORY_CARE_PROVIDER_SITE_OTHER): Payer: Medicaid Other | Admitting: Internal Medicine

## 2016-03-28 ENCOUNTER — Encounter: Payer: Self-pay | Admitting: Internal Medicine

## 2016-03-28 VITALS — BP 110/80 | HR 72 | Ht 71.0 in | Wt 161.8 lb

## 2016-03-28 DIAGNOSIS — R1310 Dysphagia, unspecified: Secondary | ICD-10-CM

## 2016-03-28 DIAGNOSIS — K220 Achalasia of cardia: Secondary | ICD-10-CM | POA: Diagnosis not present

## 2016-03-28 DIAGNOSIS — R1319 Other dysphagia: Secondary | ICD-10-CM

## 2016-03-28 NOTE — Progress Notes (Signed)
[name] 30 y.o. 1985-11-08 [id number]  Assessment & Plan:   Encounter Diagnoses  Name Primary?  . Achalasia Yes  . Esophageal dysphagia    [name] is having recurrent dysphagia problems after an initially short lived successful postoperative period, he had a repeat Heller myotomy and fundoplication procedure in June, by Dr. Garner [name] at [facility]. I will evaluate him with a Ba swallow - no tablet He may need to go back to Dr. Abran [name], the patient would like to avoid trips over to [facility] if he can, especially if there are procedures involved but I told him that may not be possible given his problems.  It is possible to Saphris medication he takes for his schizophrenia, generic name asenapine is contributing to his problems as dysphagia, sometimes severe is reported as a side effect. In addition the circumflex well he takes can cause dysphagia as well. It may be that he needs to have these medications modified if at all possible, it's a very complicated situation because of his need for these medications and his underlying achalasia.  Cc: Dr. Judeth [name], Dr. Vivi [name]   Subjective:   Chief Complaint:  HPI Got up to baked fish, mashed potatoes, lima or black beans - vomited Drinks ok but if drinks 2 may get a feeling like he will vomit or its stuck Doesn't eat at work to avoid problems 11-8 shift at [employer]  If eats Jello before pudding its ok - if pudding  May induce vomiting at times   Is eating chicken noodle soup and ensure  Wt Readings from Last 3 Encounters:  03/28/16 161 lb 12.8 oz (73.4 kg)  02/25/16 158 lb 9.6 oz (71.9 kg)  11/05/15 156 lb 11.2 oz (71.1 kg)   Stopped Nexium 1 month ago as was having problems despite that  Wants to avoid going over to [facility] if he can because mom has to take off work  No Known Allergies Outpatient Medications Prior to Visit  Medication Sig Dispense Refill  . FERREX 150 150 MG capsule TAKE ONE CAPSULE  BY MOUTH EVERY DAY 30 capsule 5  . QUEtiapine (SEROQUEL) 300 MG tablet Take 600 mg by mouth at bedtime.  1  . SAPHRIS 10 MG SUBL Take 20 mg by mouth at bedtime.  2  . esomeprazole (NEXIUM) 40 MG capsule Take 1 capsule (40 mg total) by mouth 2 (two) times daily before a meal. 60 capsule 5   No facility-administered medications prior to visit.    Past Medical History:  Diagnosis Date  . Achalasia of esophagus   . Depression   . Erosive esophagitis   . GERD (gastroesophageal reflux disease)   . Schizoaffective disorder    Past Surgical History:  Procedure Laterality Date  . BALLOON DILATION  02/12/2012   Procedure: BALLOON DILATION;  Surgeon: Gatha Mayer, MD;  Location: [facility];  Service: Endoscopy;  Laterality: N/A;  . COLONOSCOPY N/A 10/06/2015   Procedure: COLONOSCOPY;  Surgeon: Manus Gunning, MD;  Location: [place];  Service: Gastroenterology;  Laterality: N/A;  . ESOPHAGOGASTRODUODENOSCOPY N/A 10/06/2015   Procedure: ESOPHAGOGASTRODUODENOSCOPY (EGD);  Surgeon: Manus Gunning, MD;  Location: [place];  Service: Gastroenterology;  Laterality: N/A;  . ESOPHAGOGASTRODUODENOSCOPY (EGD) WITH ESOPHAGEAL DILATION  08/22/12  . esophagram  08/31/2006  . HELLER MYOTOMY  02/2007 AND 11/2015   with Dor Fundoplication- Dr. Garner [name] [facility]) twice  . PANENDOSCOPY  11/08/2006   with submucosal injection  . PANENDOSCOPY  03/09/2006  normal   Social History   Social History  . Marital status: Single    Spouse name: N/A  . Number of children: 0  . Years of education: N/A   Occupational History  . [employer]   Social History Main Topics  . Smoking status: Former Smoker    Packs/day: 0.50    Years: 2.00    Types: E-cigarettes  . Smokeless tobacco: Current User     Comment: given counseling sheet 01-26-12, pt started e-sig  . Alcohol use No  . Drug use: No  . Sexual activity: Not Asked   Other Topics Concern  . None   Social History Narrative   . None   Family History  Problem Relation Age of Onset  . Diabetes Father   . Colon cancer Neg Hx   . Stomach cancer Neg Hx        Review of Systems As above  Objective:   Physical Exam BP 110/80 (BP Location: Left Arm, Patient Position: Sitting, Cuff Size: Normal)   Pulse 72   Ht 5\' 11"  (1.803 m)   Wt 161 lb 12.8 oz (73.4 kg)   BMI 22.57 kg/m  NAD  15 minutes time spent with patient > half in counseling coordination of care

## 2016-03-28 NOTE — Patient Instructions (Signed)
   You have been scheduled for a Barium Esophogram at [facility] (1st floor of the hospital) on 04/07/16 at 10:30AM. Please arrive 15 minutes prior to your appointment for registration. Make certain not to have anything to eat or drink 6 hours prior to your test. If you need to reschedule for any reason, please contact radiology at [phone] to do so. __________________________________________________________________ A barium swallow is an examination that concentrates on views of the esophagus. This tends to be a double contrast exam (barium and two liquids which, when combined, create a gas to distend the wall of the oesophagus) or single contrast (non-ionic iodine based). The study is usually tailored to your symptoms so a good history is essential. Attention is paid during the study to the form, structure and configuration of the esophagus, looking for functional disorders (such as aspiration, dysphagia, achalasia, motility and reflux) EXAMINATION You may be asked to change into a gown, depending on the type of swallow being performed. A radiologist and radiographer will perform the procedure. The radiologist will advise you of the type of contrast selected for your procedure and direct you during the exam. You will be asked to stand, sit or lie in several different positions and to hold a small amount of fluid in your mouth before being asked to swallow while the imaging is performed .In some instances you may be asked to swallow barium coated marshmallows to assess the motility of a solid food bolus. The exam can be recorded as a digital or video fluoroscopy procedure. POST PROCEDURE It will take 1-2 days for the barium to pass through your system. To facilitate this, it is important, unless otherwise directed, to increase your fluids for the next 24-48hrs and to resume your normal diet.  This test typically takes about 30 minutes to  perform. __________________________________________________________________________________   I appreciate the opportunity to care for you. Silvano Rusk, MD, [facility]

## 2016-03-31 ENCOUNTER — Ambulatory Visit (INDEPENDENT_AMBULATORY_CARE_PROVIDER_SITE_OTHER): Payer: Medicaid Other | Admitting: Internal Medicine

## 2016-03-31 ENCOUNTER — Encounter: Payer: Self-pay | Admitting: Internal Medicine

## 2016-03-31 ENCOUNTER — Ambulatory Visit (HOSPITAL_COMMUNITY)
Admission: RE | Admit: 2016-03-31 | Discharge: 2016-03-31 | Disposition: A | Discharge status: Home / Self Care | Payer: Medicaid Other | Source: Ambulatory Visit | Attending: Family Medicine | Admitting: Family Medicine

## 2016-03-31 VITALS — BP 124/79 | HR 96 | Temp 98.1°F | Wt 161.0 lb

## 2016-03-31 DIAGNOSIS — R079 Chest pain, unspecified: Secondary | ICD-10-CM | POA: Diagnosis present

## 2016-03-31 MED ORDER — PANTOPRAZOLE SODIUM 40 MG PO TBEC: 40.0000 mg | DELAYED_RELEASE_TABLET | Freq: Two times a day (BID) | ORAL | 0 refills | Status: DC

## 2016-03-31 NOTE — Progress Notes (Signed)
   Subjective:    [name] - 30 y.o. male MRN [id number]  Date of birth: 1986/05/07  HPI  [name] is here for SDA for chest pain.  CHEST PAIN  Chest pain began as a child. Located on left side of chest. Usually it goes away in a minute or two but yesterday it lasted longer. It is described as a sharp, stabbing pain. Puts pressure on his chest to help relieve the pain.  Pain is: sharp and stabbing  How severe is the pain: 6/10 when occurs  What worsens the pain: Has not noticed an exacerbating factor  What makes the pain better: Holding pressure to chest wall  Radiation: No    PMH History of blood clot or heart problems or aneurysms: No   Symptoms Nausea/vomiting: No  Shortness of breath: No  Pleuritic pain: No  Cough: No  Swelling of legs: No Syncope: No  Heart burn or food sticking: Yes significant history of esophageal dysphagia and stricture. Scheduled for barium swallow next week. Has changed diet to softs/liquids but yesterday this bothered him as well.  Immobility: No   No family history of MI.   Review of Symptoms - see HPI PMH - Smoking status noted.     -  reports that he has quit smoking. His smoking use included E-cigarettes. He has a 1.00 pack-year smoking history. He uses smokeless tobacco. - Review of Systems: Per HPI. - Past Medical History: Patient Active Problem List   Diagnosis Date Noted  . Paranoid schizophrenia ([place])   . Weakness   . Anemia 10/04/2015  . Thrombocytosis ([place])   . Schizophrenia ([place])   . Esophageal dysphagia 02/12/2012  . Esophageal stricture 02/12/2012  . GERD (gastroesophageal reflux disease) 02/12/2012  . Chest pain 12/25/2007  . ACHALASIA 12/24/2001   - Medications: reviewed and updated    Objective:   Physical Exam BP 124/79   Pulse 96   Temp 98.1 F (36.7 C) (Oral)   Wt 161 lb (73 kg)   SpO2 100%   BMI 22.45 kg/m  Gen: NAD, alert, cooperative with exam, well-appearing CV: RRR, good S1/S2, no  murmur Resp: CTABL, no wheezes, non-labored    Assessment & Plan:   Chest pain History does not sound concerning for ACS and patient does not have any PMH or family history putting him at significant risk for a cardiac event. EKG obtained and negative for ST segment changes or LVH. Provided reassurance that the pain is unlikely to be cardiac in nature. Suspect may be related to chronic upper GI medical conditions. Patient also has a significant psych history. May be a component of anxiety although history does not sound suspicious for panic attacks.  -trial of high dose PPI  -f/u with GI and PCP for further work up/management     Phill Myron, D.O. 03/31/2016, 10:47 AM PGY-2, [place]

## 2016-03-31 NOTE — Assessment & Plan Note (Signed)
History does not sound concerning for ACS and patient does not have any PMH or family history putting him at significant risk for a cardiac event. EKG obtained and negative for ST segment changes or LVH. Provided reassurance that the pain is unlikely to be cardiac in nature. Suspect may be related to chronic upper GI medical conditions. Patient also has a significant psych history. May be a component of anxiety although history does not sound suspicious for panic attacks.  -trial of high dose PPI  -f/u with GI and PCP for further work up/management

## 2016-03-31 NOTE — Patient Instructions (Signed)
I have prescribed an acid blocking medication which might help with your chest pain. Please follow up with your PCP as needed.   Take Care,   Dr. Juleen China

## 2016-04-03 ENCOUNTER — Telehealth: Payer: Self-pay | Admitting: Internal Medicine

## 2016-04-03 NOTE — Telephone Encounter (Signed)
Barium swallow canceled at patient's request.  Dr. Lorelei Pont

## 2016-04-07 ENCOUNTER — Ambulatory Visit (HOSPITAL_COMMUNITY): Payer: Medicaid Other

## 2016-04-27 ENCOUNTER — Other Ambulatory Visit: Payer: Self-pay | Admitting: Internal Medicine

## 2016-05-15 ENCOUNTER — Telehealth: Payer: Self-pay | Admitting: Internal Medicine

## 2016-05-15 NOTE — Telephone Encounter (Signed)
Noted Patient had cancelled appointment in October.

## 2016-05-15 NOTE — Telephone Encounter (Signed)
Patient called back states he wants to cancel appointment for barrium swallow. May be going to another [place] office.

## 2016-05-16 ENCOUNTER — Ambulatory Visit: Payer: Medicaid Other | Admitting: Student

## 2016-07-03 ENCOUNTER — Other Ambulatory Visit: Payer: Self-pay | Admitting: *Deleted

## 2016-08-04 ENCOUNTER — Encounter: Payer: Self-pay | Admitting: Family Medicine

## 2016-08-04 ENCOUNTER — Ambulatory Visit (INDEPENDENT_AMBULATORY_CARE_PROVIDER_SITE_OTHER): Payer: Medicaid Other | Admitting: Family Medicine

## 2016-08-04 VITALS — BP 110/80 | HR 104 | Temp 97.4°F | Ht 71.0 in | Wt 168.8 lb

## 2016-08-04 DIAGNOSIS — K220 Achalasia of cardia: Secondary | ICD-10-CM | POA: Diagnosis not present

## 2016-08-04 LAB — TSH: TSH: 0.58 m[IU]/L (ref 0.40–4.50)

## 2016-08-04 NOTE — Patient Instructions (Addendum)
Thank you so much for coming to visit today! I have placed a referral to GI for a second opinion. We will check your electrolytes and thyroid level today. Please let us know if you do not hear from them soon.  Dr. Gerlean Ren

## 2016-08-05 LAB — COMPLETE METABOLIC PANEL WITH GFR
ALBUMIN: 4.3 g/dL (ref 3.6–5.1)
ALK PHOS: 50 U/L (ref 40–115)
ALT: 11 U/L (ref 9–46)
AST: 14 U/L (ref 10–40)
BILIRUBIN TOTAL: 0.5 mg/dL (ref 0.2–1.2)
BUN: 9 mg/dL (ref 7–25)
CALCIUM: 9.7 mg/dL (ref 8.6–10.3)
CO2: 27 mmol/L (ref 20–31)
Chloride: 105 mmol/L (ref 98–110)
Creat: 1.25 mg/dL (ref 0.60–1.35)
GFR, Est African American: 88 mL/min (ref >=60)
GFR, Est Non African American: 76 mL/min (ref >=60)
GLUCOSE: 89 mg/dL (ref 65–99)
Potassium: 4.4 mmol/L (ref 3.5–5.3)
SODIUM: 138 mmol/L (ref 135–146)
TOTAL PROTEIN: 7.2 g/dL (ref 6.1–8.1)

## 2016-08-06 NOTE — Progress Notes (Signed)
Subjective:     Patient ID: [name], male   DOB: Sep 05, 1985, 31 y.o.   MRN: [id number]  HPI [name] is a 32yo male presenting today with trouble swallowing. Reports chronic history of trouble swallowing. Now only able to eat Marguarite Arbour. States he has tried other foods, such as Stage manager, but everything else gets stuck in his throat or takes a long time to digest. Has been drinking liquids without problems and often drinks apple juice, water, and Ensure. Has vomited three times this week and was told in the past if he began vomiting this often he needed to return to see GI. Has history of Myotomy and Fundoplication in June 0000000 by Dr. Garner Nash in [place] with Dr. Carlean Purl at [place], but wants a second opinion and referral to another GI. Currently prescribed Protonix. Denies weakness or dizziness. Former Smoker.  Review of Systems Per HPI    Objective:   Physical Exam  Constitutional: He appears well-developed and well-nourished. No distress.  HENT:  Head: Normocephalic and atraumatic.  Mouth/Throat: Oropharynx is clear and moist.  Neck: No thyromegaly present.  Cardiovascular: Normal rate and regular rhythm.   No murmur heard. Pulmonary/Chest: Effort normal. No respiratory distress. He has no wheezes.  Abdominal: Soft. He exhibits no distension. There is no tenderness.  Lymphadenopathy:    He has no cervical adenopathy.  Psychiatric:  Flat affect. Would not make eye contact.       Assessment and Plan:     1. Achalasia History of Achalasia with worsening symptoms. Referral placed to GI for a second opinion. Continue Ensure. Will check CMP and TSH today. Follow up with PCP.

## 2016-08-07 ENCOUNTER — Encounter: Payer: Self-pay | Admitting: Internal Medicine

## 2016-08-16 ENCOUNTER — Ambulatory Visit: Payer: Medicaid Other | Admitting: Family Medicine

## 2016-08-24 ENCOUNTER — Other Ambulatory Visit: Payer: Self-pay | Admitting: Family Medicine

## 2016-09-14 ENCOUNTER — Telehealth: Payer: Self-pay

## 2016-09-14 ENCOUNTER — Encounter: Payer: Self-pay | Admitting: Internal Medicine

## 2016-09-14 ENCOUNTER — Ambulatory Visit: Payer: Medicaid Other | Admitting: Internal Medicine

## 2016-09-14 ENCOUNTER — Ambulatory Visit (INDEPENDENT_AMBULATORY_CARE_PROVIDER_SITE_OTHER): Payer: Medicaid Other | Admitting: Internal Medicine

## 2016-09-14 ENCOUNTER — Encounter (INDEPENDENT_AMBULATORY_CARE_PROVIDER_SITE_OTHER): Payer: Self-pay

## 2016-09-14 VITALS — BP 110/74 | HR 108 | Ht 67.5 in | Wt 162.4 lb

## 2016-09-14 DIAGNOSIS — D509 Iron deficiency anemia, unspecified: Secondary | ICD-10-CM | POA: Diagnosis not present

## 2016-09-14 DIAGNOSIS — K219 Gastro-esophageal reflux disease without esophagitis: Secondary | ICD-10-CM

## 2016-09-14 DIAGNOSIS — K220 Achalasia of cardia: Secondary | ICD-10-CM

## 2016-09-14 NOTE — Progress Notes (Signed)
   [name] 31 y.o. 1986-06-02 [id number]  Assessment & Plan:   1. ACHALASIA   2. Gastroesophageal reflux disease, esophagitis presence not specified   3. Iron deficiency anemia, unspecified iron deficiency anemia type    ACHALASIA Seems to be doing well GERD Tx helping RTC 1 year  GERD (gastroesophageal reflux disease) Stay on PPI  IDA (iron deficiency anemia) Lab Results  Component Value Date   FERRITIN 15 (L) 02/25/2016   In the office I did not review this with him we will contact him and asked him to check a CBC and ferritin.  I appreciate the opportunity to care for this patient. CC: Alexander Chyle, MD [name] M.D.  Subjective:   Chief Complaint: Follow-up achalasia  HPI  [name] had dysphagia problems in the fall, since then he's been on twice a day PPI, Shiley taking it 2 at once 80 mg pantoprazole and says he is about is Good as he has been. Wt Readings from Last 3 Encounters:  09/14/16 73.7 kg (162 lb 6 oz)  08/04/16 76.6 kg (168 lb 12.8 oz)  03/31/16 73 kg (161 lb)   He is working at [employer] 3 days a week and lives with his mom  No Known Allergies Current Meds  Medication Sig  . FERREX 150 150 MG capsule TAKE ONE CAPSULE BY MOUTH EVERY DAY  . pantoprazole (PROTONIX) 40 MG tablet TAKE 1 TABLET BY MOUTH TWICE A DAY  . QUEtiapine (SEROQUEL) 300 MG tablet Take 600 mg by mouth at bedtime.  Marland Kitchen SAPHRIS 10 MG SUBL Take 20 mg by mouth at bedtime.    Review of Systems As above  Objective:   Physical Exam BP 110/74 (BP Location: Left Arm, Patient Position: Sitting, Cuff Size: Normal)   Pulse (!) 108   Ht 5' 7.5" (1.715 m)   Wt 73.7 kg (162 lb 6 oz)   BMI 25.06 kg/m  No acute distress  15 minutes time spent with patient > half in counseling coordination of care

## 2016-09-14 NOTE — Assessment & Plan Note (Signed)
Lab Results  Component Value Date   FERRITIN 15 (L) 02/25/2016   In the office I did not review this with him we will contact him and asked him to check a CBC and ferritin.

## 2016-09-14 NOTE — Assessment & Plan Note (Addendum)
Seems to be doing well GERD Tx helping RTC 1 year

## 2016-09-14 NOTE — Telephone Encounter (Signed)
Left [name] a detailed message to come at his convenience and have labs drawn, orders in epic.

## 2016-09-14 NOTE — Assessment & Plan Note (Signed)
Stay on PPI 

## 2016-09-14 NOTE — Patient Instructions (Signed)
   Hope things keep going well. Call back if you need help otherwise see you in about 1 year.  I appreciate the opportunity to care for you. Gatha Mayer, MD, [facility]

## 2016-09-14 NOTE — Telephone Encounter (Signed)
-----   Message from Gatha Mayer, MD sent at 09/14/2016  5:16 PM EDT ----- Regarding: Ask him to come back for labs When I was finishing his chart I realized I wanted to recheck a CBC and ferritin which I did order with you please just contact him and asked him to come in and have these drawn

## 2016-09-15 ENCOUNTER — Other Ambulatory Visit (INDEPENDENT_AMBULATORY_CARE_PROVIDER_SITE_OTHER): Payer: Medicaid Other

## 2016-09-15 DIAGNOSIS — D509 Iron deficiency anemia, unspecified: Secondary | ICD-10-CM

## 2016-09-15 LAB — CBC WITH DIFFERENTIAL/PLATELET
BASOS ABS: 0 10*3/uL (ref 0.0–0.1)
Basophils Relative: 0.8 % (ref 0.0–3.0)
EOS ABS: 0.1 10*3/uL (ref 0.0–0.7)
Eosinophils Relative: 5.8 % — ABNORMAL HIGH (ref 0.0–5.0)
HCT: 42.8 % (ref 39.0–52.0)
HEMOGLOBIN: 14.3 g/dL (ref 13.0–17.0)
Lymphocytes Relative: 39.6 % (ref 12.0–46.0)
Lymphs Abs: 1 10*3/uL (ref 0.7–4.0)
MCHC: 33.4 g/dL (ref 30.0–36.0)
MCV: 85.1 fl (ref 78.0–100.0)
MONO ABS: 0.2 10*3/uL (ref 0.1–1.0)
Monocytes Relative: 9.1 % (ref 3.0–12.0)
NEUTROS PCT: 44.7 % (ref 43.0–77.0)
Neutro Abs: 1.1 10*3/uL — ABNORMAL LOW (ref 1.4–7.7)
Platelets: 310 10*3/uL (ref 150.0–400.0)
RBC: 5.03 Mil/uL (ref 4.22–5.81)
RDW: 14.4 % (ref 11.5–15.5)
WBC: 2.6 10*3/uL — AB (ref 4.0–10.5)

## 2016-09-15 LAB — FERRITIN: Ferritin: 23 ng/mL (ref 22.0–322.0)

## 2016-09-15 NOTE — Telephone Encounter (Signed)
[name] came this AM and had labs drawn.

## 2016-09-18 NOTE — Progress Notes (Signed)
Iron levels are better but he should continue to take an iron supplement

## 2017-02-14 ENCOUNTER — Other Ambulatory Visit: Payer: Self-pay | Admitting: *Deleted

## 2017-02-14 MED ORDER — POLYSACCHARIDE IRON COMPLEX 150 MG PO CAPS: 150.0000 mg | ORAL_CAPSULE | Freq: Every day | ORAL | 5 refills | Status: DC

## 2017-03-20 ENCOUNTER — Encounter: Payer: Self-pay | Admitting: Family Medicine

## 2017-03-20 ENCOUNTER — Ambulatory Visit (INDEPENDENT_AMBULATORY_CARE_PROVIDER_SITE_OTHER): Payer: Medicaid Other | Admitting: Family Medicine

## 2017-03-20 VITALS — BP 108/70 | HR 101 | Temp 97.4°F | Ht 71.0 in | Wt 155.8 lb

## 2017-03-20 DIAGNOSIS — D509 Iron deficiency anemia, unspecified: Secondary | ICD-10-CM | POA: Diagnosis not present

## 2017-03-20 DIAGNOSIS — R7989 Other specified abnormal findings of blood chemistry: Secondary | ICD-10-CM | POA: Insufficient documentation

## 2017-03-20 DIAGNOSIS — Z23 Encounter for immunization: Secondary | ICD-10-CM

## 2017-03-20 NOTE — Progress Notes (Signed)
    Subjective:  [name] is a 31 y.o. male who presents to the [facility] today for a check up.  HPI:  [name] is a little presented today for a checkup and to have labs checked. He is a history significant for iron deficiency anemia and achalasia. Has appointment with Dr. Carlean Purl for his achalasia was last seen on 09/14/2016.  Reports that he has a lot of dificulty with most solids, but can manage oatmeals, soups and other liquids. Continues to drink two ensures a day.  Does feel like he is eating less and has lost 6lbs in the past 6 months.  Reports that his mood is "ok" and has been following up with [facility] and they have been refilling his saphris 10mg  at this time. Not currently doing any counseling, but previously went to [facility] clinic and did not get much from it.      PMH: Achalasia, schizophrenia Tobacco use: None Medication: reviewed and updated ROS: see HPI   Objective:  Physical Exam: There were no vitals taken for this visit.  Gen: 31 year old male in NAD, resting comfortably CV: RRR with no murmurs appreciated Pulm: NWOB, CTAB with no crackles, wheezes, or rhonchi GI: Normal bowel sounds present. Soft, Nontender, Nondistended. MSK: no edema, cyanosis, or clubbing noted Skin: warm, dry Neuro: grossly normal, moves all extremities Psych: Thought content, normal affect and thought content  No results found for this or any previous visit (from the past 72 hour(s)).   Assessment/Plan:  Elevated serum creatinine This is a chronic problem that has been noted at least 7 years ago. Patient is no family history of polycystic kidney disease. His blood pressure is within normal limits today at 108/20. Has not had A1c checked, but patient has BMI of 21 and his last glucose level on BMP was 89 in February 2018. Very likely to have type 2 diabetes. Denies excessive NSAID use or other nephrotoxic medications.  At this time I have no explanation for his chronically elevated  creatinine.  - Rechecking creatinine today - We'll follow-up with renal ultrasound if abnormal   Health maintenance visit: Patient is usually for checkup. She reports that she is continuing to follow-up with [facility] for his schizophrenia and they are refilling his Saphris and Seroquel. Declines been interested in counseling at this time, but did appreciate the offer of introducing our Agricultural engineer. Continues with dysphagia and recently followed up with Dr. Carlean Purl about 6 months ago. Able to eat soft foods without much discomfort. However has lost about 6 pounds 6 months due to his decreased PO intake. Continuing with about 2 cans of ensure daily. Denies any red flags.  Today he is due for tetanus shot and flu shot, both of which he received.  Alexander Easterwood L. Rosalyn Gess, [place] Medicine Resident PGY-2 03/20/2017 3:26 PM

## 2017-03-20 NOTE — Patient Instructions (Signed)
Camar, you were seen today for physical exam and to have labs drawn.  Rechecking your hemoglobin levels and your iron as well as your kidney levels.  In the past to have elevated creatinine which measures your kidney function. I will check this again today and if it remains elevated I am going to put an order for a ultrasound of your kidneys.    We also give you a flu shot and a tetanus shot today.  There is very nice to me today please call the office for questions  Quillian Quince L. Rosalyn Gess, [place] Medicine Resident PGY-2 03/20/2017 2:10 PM

## 2017-03-20 NOTE — Assessment & Plan Note (Addendum)
This is a chronic problem that has been noted at least 7 years ago. Patient is no family history of polycystic kidney disease. His blood pressure is within normal limits today at 108/20. Has not had A1c checked, but patient has BMI of 21 and his last glucose level on BMP was 89 in February 2018. Very likely to have type 2 diabetes. Denies excessive NSAID use or other nephrotoxic medications.  At this time I have no explanation for his chronically elevated creatinine.  - Rechecking creatinine today - We'll follow-up with renal ultrasound if abnormal

## 2017-03-21 LAB — CBC
HEMATOCRIT: 46.2 % (ref 37.5–51.0)
HEMOGLOBIN: 15.1 g/dL (ref 13.0–17.7)
MCH: 27.7 pg (ref 26.6–33.0)
MCHC: 32.7 g/dL (ref 31.5–35.7)
MCV: 85 fL (ref 79–97)
Platelets: 319 10*3/uL (ref 150–379)
RBC: 5.46 x10E6/uL (ref 4.14–5.80)
RDW: 15.5 % — ABNORMAL HIGH (ref 12.3–15.4)
WBC: 3.2 10*3/uL — AB (ref 3.4–10.8)

## 2017-03-21 LAB — CMP14+EGFR
ALBUMIN: 4.4 g/dL (ref 3.5–5.5)
ALK PHOS: 57 IU/L (ref 39–117)
ALT: 13 IU/L (ref 0–44)
AST: 17 IU/L (ref 0–40)
Albumin/Globulin Ratio: 1.7 (ref 1.2–2.2)
BUN / CREAT RATIO: 8 — AB (ref 9–20)
BUN: 10 mg/dL (ref 6–20)
Bilirubin Total: 0.9 mg/dL (ref 0.0–1.2)
CO2: 28 mmol/L (ref 20–29)
CREATININE: 1.24 mg/dL (ref 0.76–1.27)
Calcium: 9.9 mg/dL (ref 8.7–10.2)
Chloride: 100 mmol/L (ref 96–106)
GFR calc Af Amer: 88 mL/min/{1.73_m2} (ref >59)
GFR calc non Af Amer: 76 mL/min/{1.73_m2} (ref >59)
GLUCOSE: 98 mg/dL (ref 65–99)
Globulin, Total: 2.6 g/dL (ref 1.5–4.5)
Potassium: 4.7 mmol/L (ref 3.5–5.2)
Sodium: 139 mmol/L (ref 134–144)
TOTAL PROTEIN: 7 g/dL (ref 6.0–8.5)

## 2017-03-21 LAB — FERRITIN: Ferritin: 18 ng/mL — ABNORMAL LOW (ref 30–400)

## 2017-03-27 ENCOUNTER — Other Ambulatory Visit: Payer: Self-pay | Admitting: *Deleted

## 2017-03-27 MED ORDER — PANTOPRAZOLE SODIUM 40 MG PO TBEC: 40.0000 mg | DELAYED_RELEASE_TABLET | Freq: Two times a day (BID) | ORAL | 11 refills | Status: DC

## 2017-05-10 ENCOUNTER — Ambulatory Visit: Payer: Medicaid Other | Admitting: Family Medicine

## 2017-05-10 ENCOUNTER — Other Ambulatory Visit: Payer: Self-pay

## 2017-05-10 ENCOUNTER — Encounter: Payer: Self-pay | Admitting: Family Medicine

## 2017-05-10 VITALS — BP 106/68 | HR 92 | Temp 97.8°F | Ht 68.0 in | Wt 159.0 lb

## 2017-05-10 DIAGNOSIS — R7989 Other specified abnormal findings of blood chemistry: Secondary | ICD-10-CM | POA: Diagnosis present

## 2017-05-10 DIAGNOSIS — R21 Rash and other nonspecific skin eruption: Secondary | ICD-10-CM | POA: Diagnosis not present

## 2017-05-10 MED ORDER — CLOTRIMAZOLE-BETAMETHASONE 1-0.05 % EX CREA: 1.0000 "application " | TOPICAL_CREAM | Freq: Two times a day (BID) | CUTANEOUS | 0 refills | Status: AC

## 2017-05-10 NOTE — Patient Instructions (Signed)
Ibraham, you have a rash on your feet. This is mostly likely due to irritation from your boots when they get wet. I am prescribing you a cream that you can put in your feet two times a day. Use this for 10 days.   I am rechecking your kidneys today and if the levels are elevated I will most likely order an ultrasound.   Very nice to see you today, Nickia Boesen L. Rosalyn Gess, [place] Resident PGY-2 05/10/2017 4:32 PM

## 2017-05-11 LAB — BASIC METABOLIC PANEL
BUN / CREAT RATIO: 7 — AB (ref 9–20)
BUN: 7 mg/dL (ref 6–20)
CO2: 26 mmol/L (ref 20–29)
CREATININE: 1.05 mg/dL (ref 0.76–1.27)
Calcium: 9.4 mg/dL (ref 8.7–10.2)
Chloride: 104 mmol/L (ref 96–106)
GFR, EST AFRICAN AMERICAN: 107 mL/min/{1.73_m2} (ref >59)
GFR, EST NON AFRICAN AMERICAN: 93 mL/min/{1.73_m2} (ref >59)
Glucose: 76 mg/dL (ref 65–99)
Potassium: 5.1 mmol/L (ref 3.5–5.2)
Sodium: 143 mmol/L (ref 134–144)

## 2017-05-15 ENCOUNTER — Encounter: Payer: Self-pay | Admitting: Family Medicine

## 2017-05-15 NOTE — Progress Notes (Signed)
    Subjective:  [name] is a 31 y.o. male who presents to the [name] today with a chief complaint of bilateral foot rash   HPI:  Rash on feet:  Patient reports rash on dorsum of feet for past few months. 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  Has not recently changed detergents or soaps. Denies any animal exposures or known insect bites. Feet are very itchy. Denies any pain, numbness or tingling. No itching between his toes or macerated skin.   PMH: schizophrenia Tobacco use: none Medication: reviewed and updated ROS: see HPI   Objective:  Physical Exam: BP 106/68   Pulse 92   Temp 97.8 F (36.6 C) (Oral)   Ht 5\' 8"  (1.727 m)   Wt 159 lb (72.1 kg)   SpO2 98%   BMI 24.18 kg/m   Gen: 31yo M in NAD, resting comfortably CV: RRR with no murmurs appreciated Pulm: NWOB, CTAB with no crackles, wheezes, or rhonchi GI: Normal bowel sounds present. Soft, Nontender, Nondistended. MSK: no edema, cyanosis, or clubbing noted Skin: feet with hyperkeratotic changes on dorsum bilaterally with some excoriations on left foot. No warmth, TTP or drainage.  Neuro: grossly normal, moves all extremities Psych: Normal affect and thought content  No results found for this or any previous visit (from the past 72 hour(s)).   Assessment/Plan:  Rash: Hyperkeratotic changes on dorsum of bilateral feet with pruritis consistent with chronic irritation and given history of working as a Training and development officer and frequently getting his boots wet this seems consistent with contact dermatitis. However, cannot rule out tinea pedis at this time, but patient did not have any skin changes consistent with fungal infection (macerated skin in toe webbing). Prescribed lotrisone BID daily for 10-14 days to cover both inflammatory and fungal causes. Return precautions discussed. Should be self-limiting.   Elevated creatinine Chronically elevated creatinine due to unclear  etiology.  Rechecked at this visit and creatinine is down to 1.05 from 1.24 after recommending the patient avoid nephrotoxic medications.  We will continue to monitor at next visit.  [name] L. Rosalyn Gess, [place] Medicine Resident PGY-2 05/15/2017 12:53 PM

## 2017-08-16 ENCOUNTER — Other Ambulatory Visit: Payer: Self-pay | Admitting: Family Medicine

## 2017-09-15 ENCOUNTER — Other Ambulatory Visit: Payer: Self-pay | Admitting: Family Medicine

## 2017-10-15 ENCOUNTER — Other Ambulatory Visit: Payer: Self-pay | Admitting: Family Medicine

## 2017-12-13 ENCOUNTER — Other Ambulatory Visit: Payer: Self-pay | Admitting: Family Medicine

## 2018-01-09 ENCOUNTER — Other Ambulatory Visit: Payer: Self-pay | Admitting: Family Medicine

## 2018-01-12 ENCOUNTER — Other Ambulatory Visit: Payer: Self-pay | Admitting: Family Medicine

## 2018-03-14 ENCOUNTER — Other Ambulatory Visit: Payer: Self-pay | Admitting: Family Medicine

## 2018-04-08 ENCOUNTER — Other Ambulatory Visit: Payer: Self-pay | Admitting: Family Medicine

## 2018-04-22 ENCOUNTER — Ambulatory Visit: Payer: Medicaid Other | Admitting: Physician Assistant

## 2018-04-23 ENCOUNTER — Ambulatory Visit: Payer: Medicaid Other | Admitting: Physician Assistant

## 2018-04-23 ENCOUNTER — Encounter: Payer: Self-pay | Admitting: Physician Assistant

## 2018-04-23 DIAGNOSIS — R1310 Dysphagia, unspecified: Secondary | ICD-10-CM | POA: Diagnosis not present

## 2018-04-23 DIAGNOSIS — K220 Achalasia of cardia: Secondary | ICD-10-CM

## 2018-04-23 MED ORDER — PANTOPRAZOLE SODIUM 40 MG PO TBEC: DELAYED_RELEASE_TABLET | ORAL | 11 refills | Status: DC

## 2018-04-23 NOTE — Patient Instructions (Addendum)
Finish the Dexilant samples and then start on the Pantoprazole sodium 40 mg.  We sent a prescription for that to [place].   Eat a soft diet for now.   You have been scheduled for a Barium Esophogram at [facility] (1st floor of the hospital) on Wednesday 05-01-2018 at 1:00 PM. Please arrive at 12:45 PM to your appointment for registration. Make certain not to have anything to eat or drink 3 hours prior to your test. If you need to reschedule for any reason, please contact radiology at [phone] to do so. __________________________________________________________________ A barium swallow is an examination that concentrates on views of the esophagus. This tends to be a double contrast exam (barium and two liquids which, when combined, create a gas to distend the wall of the oesophagus) or single contrast (non-ionic iodine based). The study is usually tailored to your symptoms so a good history is essential. Attention is paid during the study to the form, structure and configuration of the esophagus, looking for functional disorders (such as aspiration, dysphagia, achalasia, motility and reflux) EXAMINATION You may be asked to change into a gown, depending on the type of swallow being performed. A radiologist and radiographer will perform the procedure. The radiologist will advise you of the type of contrast selected for your procedure and direct you during the exam. You will be asked to stand, sit or lie in several different positions and to hold a small amount of fluid in your mouth before being asked to swallow while the imaging is performed .In some instances you may be asked to swallow barium coated marshmallows to assess the motility of a solid food bolus. The exam can be recorded as a digital or video fluoroscopy procedure. POST PROCEDURE It will take 1-2 days for the barium to pass through your system. To facilitate this, it is important, unless otherwise directed, to increase  your fluids for the next 24-48hrs and to resume your normal diet.  This test typically takes about 30 minutes to perform.  Normal BMI (Body Mass Index- based on height and weight) is between 19 and 25. Your BMI today is There is no height or weight on file to calculate BMI. Marland Kitchen Please consider follow up  regarding your BMI with your Primary Care Provider. __________________________________________________________________________________

## 2018-04-23 NOTE — Progress Notes (Signed)
Dg esoph  

## 2018-04-23 NOTE — Progress Notes (Signed)
Subjective:    Patient ID: [name], male    DOB: 03-11-1986, 32 y.o.   MRN: [id number]  HPI Davidson is a 32 year old African-American male known to Dr. Carlean Purl with history of achalasia.  He also has history of paranoid schizophrenia, iron deficiency anemia and thrombocytosis. He was last seen in our office in March 2018, and comes in today with recurrence of dysphasia, especially to solids with frequent regurgitation and/or vomiting over the past month. Undergone previous Heller myotomy, and then had repeat Heller myotomy and fundoplication done by Dr. Abran Richard at [facility] in 2017. Patient states that the surgery did help his symptoms for several months.  It sounds as if he stopped taking his PPI last year at some point because he was not having any problems.  Gradually over the past several months he has developed recurrence and symptoms have been particularly bad over the past 4 weeks.  He was seen at urgent care about a week ago and given samples of Dexilant 60 mg which she has been taking daily.  He says this seems to be helping quite a bit however his insurance will not cover it so he needs an alternative. He is generally able to swallow liquids without difficulty with occasional regurgitation Soft foods and sandwiches generally will go down.  Any heavy foods or meats are swallowed and then regurgitated back up.  He is also noticed an increase in thick saliva,, at times which he has to spit out.  His weight may be down a couple of pounds.  Review of Systems Pertinent positive and negative review of systems were noted in the above HPI section.  All other review of systems was otherwise negative.  Outpatient Encounter Medications as of 04/23/2018  Medication Sig  . dexlansoprazole (DEXILANT) 60 MG capsule Take 60 mg by mouth daily.  Marland Kitchen FERREX 150 150 MG capsule TAKE 1 CAPSULE BY MOUTH EVERY DAY  . QUEtiapine (SEROQUEL) 300 MG tablet Take 600 mg by mouth at bedtime.  Marland Kitchen SAPHRIS  10 MG SUBL Take 20 mg by mouth at bedtime.  . pantoprazole (PROTONIX) 40 MG tablet Take 1 tab by mouth, 30 min before breakfast and 30 min before dinner.  . [DISCONTINUED] FERREX 150 150 MG capsule TAKE 1 CAPSULE BY MOUTH EVERY DAY  . [DISCONTINUED] FERREX 150 150 MG capsule TAKE 1 CAPSULE BY MOUTH EVERY DAY  . [DISCONTINUED] pantoprazole (PROTONIX) 40 MG tablet Take 1 tablet (40 mg total) by mouth 2 (two) times daily.   No facility-administered encounter medications on file as of 04/23/2018.    No Known Allergies Patient Active Problem List   Diagnosis Date Noted  . Elevated serum creatinine 03/20/2017  . Paranoid schizophrenia ([place])   . Weakness   . IDA (iron deficiency anemia) 10/04/2015  . Thrombocytosis ([place])   . Schizophrenia ([place])   . Esophageal dysphagia 02/12/2012  . GERD (gastroesophageal reflux disease) 02/12/2012  . ACHALASIA 12/24/2001   Social History   Socioeconomic History  . Marital status: Single    Spouse name: Not on file  . Number of children: 0  . Years of education: Not on file  . Highest education level: Not on file  Occupational History  . Occupation: [employer]    Employer: [employer]  . 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 Financial resource strain: Not on file  . Food insecurity:    Worry: Not on file    Inability: Not on file  . Transportation needs:  Medical: Not on file    Non-medical: Not on file  Tobacco Use  . Smoking status: Former Smoker    Packs/day: 0.50    Years: 2.00    Pack years: 1.00    Types: E-cigarettes  . Smokeless tobacco: Former Systems developer  . Tobacco comment: given counseling sheet 01-26-12, pt started e-sig  Substance and Sexual Activity  . Alcohol use: No  . Drug use: No  . Sexual activity: Not on file  Lifestyle  . Physical activity:    Days per week: Not on file    Minutes per session: Not on file  . Stress: Not on file  Relationships  . Social connections:    Talks on phone: Not on file    Gets together: Not  on file    Attends religious service: Not on file    Active member of club or organization: Not on file    Attends meetings of clubs or organizations: Not on file    Relationship status: Not on file  . Intimate partner violence:    Fear of current or ex partner: Not on file    Emotionally abused: Not on file    Physically abused: Not on file    Forced sexual activity: Not on file  Other Topics Concern  . Not on file  Social History Narrative  . Not on file    Mr. Battershell family history includes Diabetes in his father.      Objective:    There were no vitals filed for this visit.  Physical Exam; well-developed African-American male in no acute distress,.  HEENT; nontraumatic normocephalic EOMI PERRLA sclera anicteric oral mucosa moist, Cardiovascular; regular rate and rhythm with S1-S2 no murmur rub or gallop, Pulmonary; clear bilaterally.  Abdomen; soft, nontender nondistended bowel sounds are active there is no palpable mass or hepatosplenomegaly Rectal ;exam not done, Neuro psych; alert and oriented, he does make some eye contact grossly nonfocal mood and affect appropriate       Assessment & Plan:   #68 31 year old African-American male with history of achalasia, status post 2 prior Heller myotomy's, the last was done in summer 2017 in addition to fundoplication which was done simultaneously. Patient did have relief of symptoms for several months, then has had gradual recurrence of dysphasia and significant symptoms over the past 4 weeks with regurgitation of heavier foods and meats, and occasional regurgitation of liquids.  He has had some improvement in symptoms over the past week since starting a trial of Dexilant 60 mg .  #2 history of schizophrenia #3 History of iron deficiency anemia  Plan; Dexilant is not covered by patient's insurance, will restart Protonix 40 mg p.o. 1/2-hour AC breakfast and 1/2-hour AC dinner I also advised him to continue this chronically, even  when he is improved and asymptomatic.  Will schedule for barium swallow with tablet, then can decide if any role for EGD, versus referral back to surgeon at [facility]/Dr. Abran Richard.   Yilin Weedon S Ivalee Strauser PA-C 04/23/2018   Cc: Kathrene Alu, MD

## 2018-04-24 ENCOUNTER — Other Ambulatory Visit: Payer: Self-pay | Admitting: *Deleted

## 2018-04-24 ENCOUNTER — Telehealth: Payer: Self-pay | Admitting: Physician Assistant

## 2018-04-24 MED ORDER — OMEPRAZOLE 40 MG PO CPDR: DELAYED_RELEASE_CAPSULE | ORAL | 3 refills | Status: DC

## 2018-04-24 NOTE — Telephone Encounter (Signed)
Patient states medication pantoprazole is not covered by his insurance and wants to know if there is something else that can be prescribed.

## 2018-04-24 NOTE — Telephone Encounter (Signed)
Advised patient we sent Omeprazole 40 mg twice daily dosing to [place].  Per Amy Esterwood PA-C. I asked the patient to let us know if this helps him.

## 2018-04-30 ENCOUNTER — Telehealth: Payer: Self-pay | Admitting: Physician Assistant

## 2018-04-30 NOTE — Telephone Encounter (Signed)
Patient says he has not started the Omeprazole yet because he is finishing the samples Dexilant that he has.  He was changed from [place] to Protonix due to his insurance. It was then found that the Protonix was not covered by his insurance either. This was changed to Omeprazole. Patient has not started Omeprazole.  Per his request and desire to "try" the Omeprazole first, the  DG Esophagus imaging is cancelled.

## 2018-05-01 ENCOUNTER — Ambulatory Visit (HOSPITAL_COMMUNITY): Payer: Medicaid Other

## 2018-05-07 ENCOUNTER — Other Ambulatory Visit: Payer: Self-pay | Admitting: Family Medicine

## 2018-05-12 ENCOUNTER — Other Ambulatory Visit: Payer: Self-pay | Admitting: Family Medicine

## 2018-05-21 ENCOUNTER — Ambulatory Visit: Payer: Medicaid Other | Admitting: Internal Medicine

## 2018-06-04 ENCOUNTER — Other Ambulatory Visit: Payer: Self-pay | Admitting: Family Medicine

## 2018-06-20 ENCOUNTER — Telehealth: Payer: Self-pay

## 2018-06-20 NOTE — Telephone Encounter (Signed)
Pt returned your call, her is scheduled for an appt with Dr. Carlean Purl on 08/02/2018 at 1:30pm.

## 2018-06-20 NOTE — Telephone Encounter (Signed)
-----   Message from Gatha Mayer, MD sent at 06/15/2018  4:24 PM EST ----- Regarding: appt me in Feb Ask him tio see me in Feb when that schedule is out

## 2018-06-20 NOTE — Telephone Encounter (Signed)
I left him a detailed message to call back and set up a February appointment.

## 2018-07-07 ENCOUNTER — Other Ambulatory Visit: Payer: Self-pay | Admitting: Family Medicine

## 2018-08-02 ENCOUNTER — Encounter: Payer: Self-pay | Admitting: Internal Medicine

## 2018-08-02 ENCOUNTER — Ambulatory Visit: Payer: Medicaid Other | Admitting: Internal Medicine

## 2018-08-02 VITALS — BP 100/66 | HR 97 | Ht 67.72 in | Wt 156.0 lb

## 2018-08-02 DIAGNOSIS — K222 Esophageal obstruction: Secondary | ICD-10-CM | POA: Diagnosis not present

## 2018-08-02 DIAGNOSIS — K219 Gastro-esophageal reflux disease without esophagitis: Secondary | ICD-10-CM | POA: Diagnosis not present

## 2018-08-02 DIAGNOSIS — K220 Achalasia of cardia: Secondary | ICD-10-CM | POA: Diagnosis not present

## 2018-08-02 MED ORDER — OMEPRAZOLE 40 MG PO CPDR: DELAYED_RELEASE_CAPSULE | ORAL | 11 refills | Status: DC

## 2018-08-02 NOTE — Patient Instructions (Addendum)
We have sent the following medications to your pharmacy for you to pick up at your convenience: Omeprazole   Follow up with [place] in a year or sooner if needed.   I appreciate the opportunity to care for you. Silvano Rusk, MD, [facility]

## 2018-08-02 NOTE — Progress Notes (Signed)
   [name] 32 y.o. Feb 17, 1986 [id number]  Assessment & Plan:   Encounter Diagnoses  Name Primary?  . GERD with stricture Yes  . Achalasia    [name] seems to be doing well with occasional regurgitation of food which could be related to his achalasia as opposed to a stricture.  Suspect the former. 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  He says a couple or few times a month he may regurgitate food.  He does not feel like he needs a dilation of his esophagus.  Wt Readings from Last 3 Encounters:  08/02/18 156 lb (70.8 kg)  05/10/17 159 lb (72.1 kg)  03/20/17 155 lb 12.8 oz (70.7 kg)     Social history notable for him now working at [employer]  No Known Allergies Current Meds  Medication Sig  . FERREX 150 150 MG capsule TAKE 1 CAPSULE BY MOUTH EVERY DAY  . omeprazole (PRILOSEC) 40 MG capsule Take 1 tablet by mouth twice daily, before breakfast and before dinner.  Marland Kitchen QUEtiapine (SEROQUEL) 300 MG tablet Take 600 mg by mouth at bedtime.  Marland Kitchen SAPHRIS 10 MG SUBL Take 20 mg by mouth at bedtime.   Past Medical History:  Diagnosis Date  . Achalasia of esophagus   . Depression   . GERD with stricture   . Schizoaffective disorder    Past Surgical History:  Procedure Laterality Date  . BALLOON DILATION  02/12/2012   Procedure: BALLOON DILATION;  Surgeon: Gatha Mayer, MD;  Location: [facility];  Service: Endoscopy;  Laterality: N/A;  . COLONOSCOPY N/A 10/06/2015   Procedure: COLONOSCOPY;  Surgeon: Manus Gunning, MD;  Location: [facility];  Service: Gastroenterology;  Laterality: N/A;  . ESOPHAGOGASTRODUODENOSCOPY N/A 10/06/2015   Procedure: ESOPHAGOGASTRODUODENOSCOPY (EGD);  Surgeon: Manus Gunning, MD;  Location: [facility];  Service: Gastroenterology;  Laterality: N/A;  . ESOPHAGOGASTRODUODENOSCOPY (EGD) WITH ESOPHAGEAL DILATION  08/22/12  . esophagram  08/31/2006  . HELLER MYOTOMY  02/2007 AND 11/2015   with Dor Fundoplication- Dr. Garner [facility]) twice  . PANENDOSCOPY  11/08/2006   with submucosal injection  . PANENDOSCOPY  03/09/2006   normal   Social History   Social History Narrative   Single   Lives w/ mom   Employed at [employer] and 63   family history includes Diabetes in his father.   Review of Systems As above  Objective:   Physical Exam BP 100/66   Pulse 97   Ht 5' 7.72" (1.72 m) Comment: w/o shoes  Wt 156 lb (70.8 kg)   BMI 23.92 kg/m  NAD

## 2018-08-03 ENCOUNTER — Other Ambulatory Visit: Payer: Self-pay | Admitting: Family Medicine

## 2018-10-06 ENCOUNTER — Other Ambulatory Visit: Payer: Self-pay | Admitting: Family Medicine

## 2018-11-02 ENCOUNTER — Other Ambulatory Visit: Payer: Self-pay | Admitting: Family Medicine

## 2018-11-30 ENCOUNTER — Other Ambulatory Visit: Payer: Self-pay | Admitting: Family Medicine

## 2018-12-30 ENCOUNTER — Other Ambulatory Visit: Payer: Self-pay | Admitting: Family Medicine

## 2019-01-08 ENCOUNTER — Telehealth: Payer: Self-pay | Admitting: Internal Medicine

## 2019-01-08 DIAGNOSIS — K220 Achalasia of cardia: Secondary | ICD-10-CM

## 2019-01-08 NOTE — Telephone Encounter (Signed)
Patient notified  He will come by and sign his paperwork and pick up instructions tomorrow for EGD [place] on 7/21/200 4:30

## 2019-01-08 NOTE — Telephone Encounter (Signed)
OK to set up EGD and dil in [place] next week If I do not have a slot let me know  He should go on clear liquids + milk products + scrambled eggs day before to reduce chances of food retention

## 2019-01-08 NOTE — Telephone Encounter (Signed)
Left message for patient to call back  

## 2019-01-08 NOTE — Telephone Encounter (Signed)
Pt requested a call back to discuss his regurgitation

## 2019-01-08 NOTE — Telephone Encounter (Signed)
Patient reports that he has been "regurgitating" for the last month.  He says not with swallowing, but with keeping it down.  Went to an Urgent care that sent him to [place].  They attempted an EGD today, but could not proceed due to food in the esophagus.  They told him " the bottom of the esophagus is almost closed".  He is asking for a repeat EGD and dilation.  I did ask him how he ended up at [facility] and he said he had to wait too long to get in here and went to the Urgent care.  He did not try and reach our office until he made the call this pm. We discussed keeping his achalasia  care with Dr. Carlean Purl or with the surgeon and to let us know if he is having difficulty. Please advise next steps

## 2019-01-13 ENCOUNTER — Telehealth: Payer: Self-pay | Admitting: Internal Medicine

## 2019-01-13 NOTE — Telephone Encounter (Signed)

## 2019-01-14 ENCOUNTER — Telehealth: Payer: Self-pay | Admitting: Internal Medicine

## 2019-01-14 ENCOUNTER — Encounter: Payer: Self-pay | Admitting: Internal Medicine

## 2019-01-14 ENCOUNTER — Ambulatory Visit (AMBULATORY_SURGERY_CENTER): Payer: Medicaid Other | Admitting: Internal Medicine

## 2019-01-14 ENCOUNTER — Other Ambulatory Visit: Payer: Self-pay

## 2019-01-14 VITALS — BP 116/81 | HR 81 | Temp 98.1°F | Resp 13 | Ht 67.0 in | Wt 156.0 lb

## 2019-01-14 DIAGNOSIS — D500 Iron deficiency anemia secondary to blood loss (chronic): Secondary | ICD-10-CM

## 2019-01-14 DIAGNOSIS — K220 Achalasia of cardia: Secondary | ICD-10-CM

## 2019-01-14 DIAGNOSIS — K222 Esophageal obstruction: Secondary | ICD-10-CM

## 2019-01-14 MED ORDER — SODIUM CHLORIDE 0.9 % IV SOLN: 500.0000 mL | Freq: Once | INTRAVENOUS | Status: DC

## 2019-01-14 MED ORDER — METOCLOPRAMIDE HCL 10 MG PO TABS: 10.0000 mg | ORAL_TABLET | Freq: Three times a day (TID) | ORAL | 0 refills | Status: DC

## 2019-01-14 NOTE — Patient Instructions (Addendum)
There was food in the esophagus and stomach. I dilated the esophagus to see if that helps.  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  Please follow the step 2 gastroparesis diet and start taking metaclopramide 10 mg before meals and at bedtime.  DO NOT EAT ANY RAW VEGETABLES  We will set up a repaet endoscopy exam for August to see if this is helping and go from there.  For today only drink liquids thoiugh. Clear and then full liquids.  I also need you to come back later this week and get blood tests to follow-up on the anemia and your chemistries.  I appreciate the opportunity to care for you. Gatha Mayer, MD, [facility]  Please read handouts provided. Please begin Reglan, if possible tonight.     YOU HAD AN ENDOSCOPIC PROCEDURE TODAY AT [facility]:   Refer to the procedure report that was given to you for any specific questions about what was found during the examination.  If the procedure report does not answer your questions, please call your gastroenterologist to clarify.  If you requested that your care partner not be given the details of your procedure findings, then the procedure report has been included in a sealed envelope for you to review at your convenience later.  YOU SHOULD EXPECT: Some feelings of bloating in the abdomen. Passage of more gas than usual.  Walking can help get rid of the air that was put into your GI tract during the procedure and reduce the bloating. If you had a lower endoscopy (such as a colonoscopy or flexible sigmoidoscopy) you may notice spotting of blood in your stool or on the toilet paper. If you underwent a bowel prep for your procedure, you may not have a normal bowel movement for a few days.  Please Note:  You might notice some irritation and congestion in your nose or some drainage.  This is from the oxygen used during your procedure.  There is  no need for concern and it should clear up in a day or so.  SYMPTOMS TO REPORT IMMEDIATELY:     Following upper endoscopy (EGD)  Vomiting of blood or coffee ground material  New chest pain or pain under the shoulder blades  Painful or persistently difficult swallowing  New shortness of breath  Fever of 100F or higher  Black, tarry-looking stools  For urgent or emergent issues, a gastroenterologist can be reached at any hour by calling [phone].   DIET:   Drink plenty of fluids but you should avoid alcoholic beverages for 24 hours.  ACTIVITY:  You should plan to take it easy for the rest of today and you should NOT DRIVE or use heavy machinery until tomorrow (because of the sedation medicines used during the test).    FOLLOW UP: Our staff will call the number listed on your records 48-72 hours following your procedure to check on you and address any questions or concerns that you may have regarding the information given to you following your procedure. If we do not reach you, we will leave a message.  We will attempt to reach you two times.  During this call, we will ask if you have developed any symptoms of COVID 19. If you develop any symptoms (ie: fever, flu-like symptoms, shortness of breath, cough etc.) before then, please call [phone].  If you test positive for Covid 19 in the 2  weeks post procedure, please call and report this information to [place].    If any biopsies were taken you will be contacted by phone or by letter within the next 1-3 weeks.  Please call us at [phone] if you have not heard about the biopsies in 3 weeks.    SIGNATURES/CONFIDENTIALITY: You and/or your care partner have signed paperwork which will be entered into your electronic medical record.  These signatures attest to the fact that that the information above on your After Visit Summary has been reviewed and is understood.  Full responsibility of the confidentiality of this discharge  information lies with you and/or your care-partner.

## 2019-01-14 NOTE — Telephone Encounter (Signed)
Patient would like to know if he can drink coffee today before 12:00. He has a EGD today.

## 2019-01-14 NOTE — Progress Notes (Signed)
Food in stomach, HOB elevated. To PACU, VSS. Report to RN.tb

## 2019-01-14 NOTE — Progress Notes (Signed)
Temps taken by CW V/S taken by JB

## 2019-01-14 NOTE — Op Note (Signed)
[place] Patient Name: [name] Procedure Date: 01/14/2019 4:30 PM MRN: [id number] Endoscopist: Gatha Mayer , MD Age: 33 Referring MD:  Date of Birth: February 03, 1986 Gender: Male Account #: [id number] Procedure:                Upper GI endoscopy Indications:              Dysphagia, Achalasia, Follow-up of achalasia Medicines:                Propofol per Anesthesia, Monitored Anesthesia Care Procedure:                Pre-Anesthesia Assessment:                           - Prior to the procedure, a History and Physical                            was performed, and patient medications and                            allergies were reviewed. The patient's tolerance of                            previous anesthesia was also reviewed. The risks                            and benefits of the procedure and the sedation                            options and risks were discussed with the patient.                            All questions were answered, and informed consent                            was obtained. Prior Anticoagulants: The patient has                            taken no previous anticoagulant or antiplatelet                            agents. ASA Grade Assessment: II - A patient with                            mild systemic disease. After reviewing the risks                            and benefits, the patient was deemed in                            satisfactory condition to undergo the procedure.                           After obtaining informed consent, the endoscope was  passed under direct vision. Throughout the                            procedure, the patient's blood pressure, pulse, and                            oxygen saturations were monitored continuously. The                            Endoscope was introduced through the mouth, and                            advanced to the second part of duodenum. The upper                 GI endoscopy was performed with difficulty due to                            presence of food. The patient tolerated the                            procedure well. Scope In: Scope Out: Findings:                 The lumen of the esophagus was severely dilated.                           Food was found in the entire esophagus.                           A large amount of food (residue) was found in the                            entire examined stomach.                           The examined duodenum was normal.                           One benign-appearing, intrinsic moderate stenosis                            was found at the gastroesophageal junction. The                            stenosis was traversed. A TTS dilator was passed                            through the scope. Dilation with an 18-19-20 mm                            balloon dilator was performed to 19 mm. The                            dilation site was examined and showed mild mucosal  disruption. Estimated blood loss was minimal. Complications:            No immediate complications. Estimated Blood Loss:     Estimated blood loss was minimal. Impression:               - Dilation in the entire esophagus.                           - Food in the esophagus.                           - A large amount of food (residue) in the stomach.                           - Normal examined duodenum.                           - Benign-appearing esophageal stenosis. Dilated.                            This was challenging due to limitied visibility but                            was able to flush and ID the GE junction and dilate                            to 19 mm                           - No specimens collected. Recommendation:           - Patient has a contact number available for                            emergencies. The signs and symptoms of potential                            delayed  complications were discussed with the                            patient. Return to normal activities tomorrow.                            Written discharge instructions were provided to the                            patient.                           - Clear liquid diet for 2 hours.                           - Full liquid diet today.                           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

## 2019-01-14 NOTE — Progress Notes (Signed)
Called to room to assist during endoscopic procedure.  Patient ID and intended procedure confirmed with present staff. Received instructions for my participation in the procedure from the performing physician.  

## 2019-01-14 NOTE — Telephone Encounter (Signed)
Pt asked if he could have coffee before 12 noon today I called him back and told him he could with no cream. SM

## 2019-01-16 ENCOUNTER — Telehealth: Payer: Self-pay

## 2019-01-16 NOTE — Telephone Encounter (Signed)
  Follow up Call-  Call back number 01/14/2019  Post procedure Call Back phone  # [phone]  Permission to leave phone message Yes  Some recent data might be hidden     Patient questions:  Do you have a fever, pain , or abdominal swelling? No. Pain Score  0 *  Have you tolerated food without any problems? Yes.    Have you been able to return to your normal activities? Yes.    Do you have any questions about your discharge instructions: Diet   No. Medications  No. Follow up visit  No.  Do you have questions or concerns about your Care? No.  Actions: * If pain score is 4 or above: No action needed, pain <4.  1. Have you developed a fever since your procedure? no  2.   Have you had an respiratory symptoms (SOB or cough) since your procedure? no  3.   Have you tested positive for COVID 19 since your procedure no  4.   Have you had any family members/close contacts diagnosed with the COVID 19 since your procedure?  no   If yes to any of these questions please route to Joylene John, RN and Alphonsa Gin, Therapist, sports.

## 2019-01-17 ENCOUNTER — Other Ambulatory Visit (INDEPENDENT_AMBULATORY_CARE_PROVIDER_SITE_OTHER): Payer: Medicaid Other

## 2019-01-17 DIAGNOSIS — K220 Achalasia of cardia: Secondary | ICD-10-CM

## 2019-01-17 DIAGNOSIS — D500 Iron deficiency anemia secondary to blood loss (chronic): Secondary | ICD-10-CM

## 2019-01-17 LAB — COMPREHENSIVE METABOLIC PANEL
ALT: 23 U/L (ref 0–53)
AST: 21 U/L (ref 0–37)
Albumin: 4.6 g/dL (ref 3.5–5.2)
Alkaline Phosphatase: 48 U/L (ref 39–117)
BUN: 8 mg/dL (ref 6–23)
CO2: 28 mEq/L (ref 19–32)
Calcium: 10 mg/dL (ref 8.4–10.5)
Chloride: 102 mEq/L (ref 96–112)
Creatinine, Ser: 1.03 mg/dL (ref 0.40–1.50)
GFR: 99.29 mL/min (ref >60.00)
Glucose, Bld: 87 mg/dL (ref 70–99)
Potassium: 3.9 mEq/L (ref 3.5–5.1)
Sodium: 140 mEq/L (ref 135–145)
Total Bilirubin: 0.5 mg/dL (ref 0.2–1.2)
Total Protein: 7.6 g/dL (ref 6.0–8.3)

## 2019-01-17 LAB — CBC
HCT: 44.1 % (ref 39.0–52.0)
Hemoglobin: 14.6 g/dL (ref 13.0–17.0)
MCHC: 33.1 g/dL (ref 30.0–36.0)
MCV: 84.7 fl (ref 78.0–100.0)
Platelets: 303 10*3/uL (ref 150.0–400.0)
RBC: 5.21 Mil/uL (ref 4.22–5.81)
RDW: 15.1 % (ref 11.5–15.5)
WBC: 3 10*3/uL — ABNORMAL LOW (ref 4.0–10.5)

## 2019-01-17 LAB — FERRITIN: Ferritin: 33.7 ng/mL (ref 22.0–322.0)

## 2019-01-17 LAB — VITAMIN B12: Vitamin B-12: 805 pg/mL (ref 211–911)

## 2019-01-19 ENCOUNTER — Other Ambulatory Visit: Payer: Self-pay | Admitting: Family Medicine

## 2019-01-19 NOTE — Progress Notes (Signed)
Please let [name] know labs are ok  Will discuss more when he comes for repeat EGD  Hope the Reglan is helping

## 2019-01-29 ENCOUNTER — Ambulatory Visit (AMBULATORY_SURGERY_CENTER): Payer: Self-pay | Admitting: *Deleted

## 2019-01-29 ENCOUNTER — Other Ambulatory Visit: Payer: Self-pay

## 2019-01-29 VITALS — Ht 68.0 in | Wt 156.0 lb

## 2019-01-29 DIAGNOSIS — K222 Esophageal obstruction: Secondary | ICD-10-CM

## 2019-01-29 NOTE — Progress Notes (Signed)
Patient is here for PV today. Patient denies any allergies to eggs or soy. Patient denies any problems with anesthesia/sedation. Patient denies any oxygen use at home. Patient denies taking any diet/weight loss medications or blood thinners. Patient aware to have Clear liquids for 2 days before EGD per Dr.Gessner's report. Pt is aware that care partner will wait in the car during procedure; if they feel like they will be too hot to wait in the car; they may wait in the lobby.  We want them to wear a mask (we do not have any that we can provide them), practice social distancing, and we will check their temperatures when they get here.  I did remind patient that their care partner needs to stay in the parking lot the entire time. Pt will wear mask into building.

## 2019-02-04 ENCOUNTER — Ambulatory Visit: Payer: Medicaid Other | Admitting: Internal Medicine

## 2019-02-06 ENCOUNTER — Telehealth: Payer: Self-pay | Admitting: Internal Medicine

## 2019-02-06 NOTE — Telephone Encounter (Signed)

## 2019-02-07 ENCOUNTER — Ambulatory Visit (AMBULATORY_SURGERY_CENTER): Payer: Medicaid Other | Admitting: Internal Medicine

## 2019-02-07 ENCOUNTER — Encounter: Payer: Self-pay | Admitting: Internal Medicine

## 2019-02-07 ENCOUNTER — Other Ambulatory Visit: Payer: Self-pay

## 2019-02-07 VITALS — BP 116/78 | HR 84 | Temp 98.2°F | Resp 18 | Ht 68.0 in | Wt 156.0 lb

## 2019-02-07 DIAGNOSIS — K200 Eosinophilic esophagitis: Secondary | ICD-10-CM | POA: Diagnosis not present

## 2019-02-07 DIAGNOSIS — K220 Achalasia of cardia: Secondary | ICD-10-CM | POA: Diagnosis not present

## 2019-02-07 DIAGNOSIS — K222 Esophageal obstruction: Secondary | ICD-10-CM | POA: Diagnosis not present

## 2019-02-07 DIAGNOSIS — K209 Esophagitis, unspecified: Secondary | ICD-10-CM | POA: Diagnosis not present

## 2019-02-07 MED ORDER — SODIUM CHLORIDE 0.9 % IV SOLN: 500.0000 mL | Freq: Once | INTRAVENOUS | Status: DC

## 2019-02-07 MED ORDER — METOCLOPRAMIDE HCL 10 MG PO TABS: 10.0000 mg | ORAL_TABLET | Freq: Three times a day (TID) | ORAL | 0 refills | Status: DC

## 2019-02-07 NOTE — Progress Notes (Signed)
A and O x3. Report to RN. Tolerated MAC anesthesia well.Teeth unchanged after procedure.

## 2019-02-07 NOTE — Progress Notes (Signed)
Eyes looking clearer.

## 2019-02-07 NOTE — Progress Notes (Signed)
Pt.'s eyes very red.  Encourage to blink to alleviate possible dry corneas.  If redness did not improve, to visit PCP re: possible bilateral conjunctivitis.

## 2019-02-07 NOTE — Progress Notes (Signed)
Pt's states no medical or surgical changes since previsit or office visit. 

## 2019-02-07 NOTE — Progress Notes (Signed)
Called to room to assist during endoscopic procedure.  Patient ID and intended procedure confirmed with present staff. Received instructions for my participation in the procedure from the performing physician.  

## 2019-02-07 NOTE — Patient Instructions (Addendum)
I dilated the esophagus to 20 mm today and took some biopsies to check the esophagus.  I am glad you are doing better.  Once I see the biopsy results we will call and get an office visit set up.  Be sure to stay on the metaclopramide - I sent a refill today.  I appreciate the opportunity to care for you. Gatha Mayer, MD, [facility]  Esophagitis handout given to patient. Dilation diet handout given to patient.  Continue present medications.  Await pathology results.  Return to office in 1-2 months.  YOU HAD AN ENDOSCOPIC PROCEDURE TODAY AT [facility]:   Refer to the procedure report that was given to you for any specific questions about what was found during the examination.  If the procedure report does not answer your questions, please call your gastroenterologist to clarify.  If you requested that your care partner not be given the details of your procedure findings, then the procedure report has been included in a sealed envelope for you to review at your convenience later.  YOU SHOULD EXPECT: Some feelings of bloating in the abdomen. Passage of more gas than usual.  Walking can help get rid of the air that was put into your GI tract during the procedure and reduce the bloating. If you had a lower endoscopy (such as a colonoscopy or flexible sigmoidoscopy) you may notice spotting of blood in your stool or on the toilet paper. If you underwent a bowel prep for your procedure, you may not have a normal bowel movement for a few days.  Please Note:  You might notice some irritation and congestion in your nose or some drainage.  This is from the oxygen used during your procedure.  There is no need for concern and it should clear up in a day or so.  SYMPTOMS TO REPORT IMMEDIATELY:  Following upper endoscopy (EGD)  Vomiting of blood or coffee ground material  New chest pain or pain under the shoulder blades  Painful or persistently difficult swallowing  New  shortness of breath  Fever of 100F or higher  Black, tarry-looking stools  For urgent or emergent issues, a gastroenterologist can be reached at any hour by calling [phone].   DIET:  We do recommend a small meal at first, but then you may proceed to your regular diet.  Drink plenty of fluids but you should avoid alcoholic beverages for 24 hours.  ACTIVITY:  You should plan to take it easy for the rest of today and you should NOT DRIVE or use heavy machinery until tomorrow (because of the sedation medicines used during the test).    FOLLOW UP: Our staff will call the number listed on your records 48-72 hours following your procedure to check on you and address any questions or concerns that you may have regarding the information given to you following your procedure. If we do not reach you, we will leave a message.  We will attempt to reach you two times.  During this call, we will ask if you have developed any symptoms of COVID 19. If you develop any symptoms (ie: fever, flu-like symptoms, shortness of breath, cough etc.) before then, please call [phone].  If you test positive for Covid 19 in the 2 weeks post procedure, please call and report this information to [place].    If any biopsies were taken you will be contacted by phone or by letter within the next 1-3 weeks.  Please call us  at [phone] if you have not heard about the biopsies in 3 weeks.    SIGNATURES/CONFIDENTIALITY: You and/or your care partner have signed paperwork which will be entered into your electronic medical record.  These signatures attest to the fact that that the information above on your After Visit Summary has been reviewed and is understood.  Full responsibility of the confidentiality of this discharge information lies with you and/or your care-partner.

## 2019-02-07 NOTE — Progress Notes (Signed)
SP- Temp CW- Vitals 

## 2019-02-07 NOTE — Op Note (Signed)
[place] Patient Name: [name] Procedure Date: 02/07/2019 12:14 PM MRN: [id number] Endoscopist: Gatha Mayer , MD Age: 33 Referring MD:  Date of Birth: [birth year] Gender: Male Account #: [id number] Procedure:                Upper GI endoscopy Indications:              Dysphagia, Achalasia, Follow-up of achalasia Medicines:                Propofol per Anesthesia, Monitored Anesthesia Care Procedure:                Pre-Anesthesia Assessment:                           - Prior to the procedure, a History and Physical                            was performed, and patient medications and                            allergies were reviewed. The patient's tolerance of                            previous anesthesia was also reviewed. The risks                            and benefits of the procedure and the sedation                            options and risks were discussed with the patient.                            All questions were answered, and informed consent                            was obtained. Prior Anticoagulants: The patient has                            taken no previous anticoagulant or antiplatelet                            agents. ASA Grade Assessment: II - A patient with                            mild systemic disease. After reviewing the risks                            and benefits, the patient was deemed in                            satisfactory condition to undergo the procedure.                           After obtaining informed consent, the endoscope was  passed under direct vision. Throughout the                            procedure, the patient's blood pressure, pulse, and                            oxygen saturations were monitored continuously. The                            Endoscope was introduced through the mouth, and                            advanced to the second part of duodenum. The upper                             GI endoscopy was somewhat difficult due to poor                            endoscopic visualization. The patient tolerated the                            procedure well. Scope In: Scope Out: Findings:                 Fluid was found in the middle third of the                            esophagus and in the lower third of the esophagus.                            And dilated esophagus                           One benign-appearing, intrinsic moderate stenosis                            was found at the gastroesophageal junction.                            Inflamed and friable. The stenosis was traversed. A                            TTS dilator was passed through the scope. Dilation                            with an 18-19-20 mm balloon dilator was performed                            to 20 mm. The dilation site was examined and showed                            mild mucosal disruption. Estimated blood loss was  minimal.                           Esophagitis (cobblestone appearance)with no                            bleeding was found in the lower third of the                            esophagus. Biopsies were taken with a cold forceps                            for histology. Verification of patient                            identification for the specimen was done. Estimated                            blood loss was minimal.                           A medium amount of food (residue) was found in the                            gastric fundus, in the gastric body and in the                            gastric antrum.                           The exam was otherwise without abnormality.                           The cardia and gastric fundus were w/o other                            abnormality on retroflexion. Retained food                            precluded good views of cardia and prior partial                             fundoplication Complications:            No immediate complications. Estimated Blood Loss:     Estimated blood loss was minimal. Impression:               - Fluid in the middle third of the esophagus and in                            the lower third of the esophagus. And dilated                            esophagus                           -  Benign-appearing esophageal stenosis. Dilated.                           - Esophagitis. Biopsied.                           - A medium amount of food (residue) in the stomach.                           - The examination was otherwise normal. Recommendation:           - Patient has a contact number available for                            emergencies. The signs and symptoms of potential                            delayed complications were discussed with the                            patient. Return to normal activities tomorrow.                            Written discharge instructions were provided to the                            patient.                           - Clear liquids x 1 hour then soft foods rest of                            day. Start prior diet tomorrow.                           - Continue present medications.                           - Await pathology results.                           - I WILL CALL RESULTS AND PLANS - ANTICIPATE OFFICE                            VISIT IN 1-2 MONTHS                           HE REPORTS BEING BETTER ON GASTROPARESIS DIET AND                            METACLOPRMIDE AND NO SIDE EFFECTS - THIS AND                            FINDINGS OF RETAINED GASTRIC MATERIAL SUPPORT DX OF  GASTROPARESIS Gatha Mayer, MD 02/07/2019 12:46:53 PM This report has been signed electronically.

## 2019-02-11 ENCOUNTER — Telehealth: Payer: Self-pay

## 2019-02-11 NOTE — Telephone Encounter (Signed)
  Follow up Call-  Call back number 02/07/2019 01/14/2019  Post procedure Call Back phone  # [phone]  Permission to leave phone message Yes Yes  Some recent data might be hidden     Patient questions:  Do you have a fever, pain , or abdominal swelling? No. Pain Score  0 *  Have you tolerated food without any problems? Yes.    Have you been able to return to your normal activities? Yes.    Do you have any questions about your discharge instructions: Diet   No. Medications  No. Follow up visit  No.  Do you have questions or concerns about your Care? No.  Actions: * If pain score is 4 or above: No action needed, pain <4.  1. Have you developed a fever since your procedure? no  2.   Have you had an respiratory symptoms (SOB or cough) since your procedure? no  3.   Have you tested positive for COVID 19 since your procedure no  4.   Have you had any family members/close contacts diagnosed with the COVID 19 since your procedure?  no   If yes to any of these questions please route to Joylene John, RN and Alphonsa Gin, Therapist, sports.

## 2019-02-16 NOTE — Progress Notes (Signed)
[facility] - no letter - place 1 year EGD recall  Office  Please let Camryn know that the biopsies show inflammation which we would expect. Nothing bad  Please arrage a late Sept or  early October appointment

## 2019-02-18 ENCOUNTER — Other Ambulatory Visit: Payer: Self-pay

## 2019-02-18 MED ORDER — POLYSACCHARIDE IRON COMPLEX 150 MG PO CAPS: ORAL_CAPSULE | ORAL | 3 refills | Status: DC

## 2019-02-21 ENCOUNTER — Telehealth: Payer: Self-pay | Admitting: Internal Medicine

## 2019-02-24 NOTE — Telephone Encounter (Signed)
Patient reports that he had one episode of vomiting last week.  He thinks now that it was the seasoning on the fish.  No further vomiting.  He will continue reglan as ordered and call back for continued vomiting.

## 2019-03-25 ENCOUNTER — Other Ambulatory Visit: Payer: Self-pay

## 2019-03-25 ENCOUNTER — Ambulatory Visit (INDEPENDENT_AMBULATORY_CARE_PROVIDER_SITE_OTHER): Payer: Medicaid Other | Admitting: Internal Medicine

## 2019-03-25 ENCOUNTER — Encounter: Payer: Self-pay | Admitting: Internal Medicine

## 2019-03-25 DIAGNOSIS — K220 Achalasia of cardia: Secondary | ICD-10-CM | POA: Diagnosis not present

## 2019-03-25 DIAGNOSIS — K219 Gastro-esophageal reflux disease without esophagitis: Secondary | ICD-10-CM | POA: Diagnosis not present

## 2019-03-25 DIAGNOSIS — K222 Esophageal obstruction: Secondary | ICD-10-CM

## 2019-03-25 DIAGNOSIS — K3184 Gastroparesis: Secondary | ICD-10-CM | POA: Diagnosis not present

## 2019-03-25 HISTORY — DX: Gastroparesis: K31.84

## 2019-03-25 MED ORDER — METOCLOPRAMIDE HCL 5 MG PO TABS: 5.0000 mg | ORAL_TABLET | Freq: Three times a day (TID) | ORAL | 1 refills | Status: DC

## 2019-03-25 NOTE — Assessment & Plan Note (Signed)
This is improved.  Continue reflux regimen.  Follow-up in 2 months.

## 2019-03-25 NOTE — Progress Notes (Signed)
[name] 33 y.o. 09/08/1985 [id number]  Assessment & Plan:  GERD with stricture, status post Heller myotomy This is improved.  Continue reflux regimen.  Follow-up in 2 months.  ACHALASIA Stable improved after dilation of suspected peptic stricture related to previous surgery.  Symptomatically improved with diet management as well.  Gastroparesis, suspected, question vagal nerve injury He has improved on metoclopramide.  No signs of toxicity.  I discussed how dose reduction and minimizing the use of that is in his best interest.  Diet modification seems to have helped.  We will try 5 mg metoclopramide.  If that is not working go back to 10.  See me in 2 months.  Consider domperidone though it could be tricky to get that and to make it affordable.  Monitor for toxicities which I discussed with him today.    I appreciate the opportunity to care for this patient. CC: Kathrene Alu, MD   Subjective:   Chief Complaint: Follow-up of achalasia with vomiting  HPI [name] reports that he is doing well swallowing well, if he is careful with eating and he only has 1 bite of food rather than "stuffing my mouth" he does a lot better.  Dysphagia is a lot less and is not vomiting.  He seems to be tolerating the metoclopramide and is happy with that. Last EGD dilated to 20 mm.  He had food in the stomach as well again as he did in July. Wt Readings from Last 3 Encounters:  03/25/19 153 lb 8 oz (69.6 kg)  02/07/19 156 lb (70.8 kg)  01/29/19 156 lb (70.8 kg)    No Known Allergies Current Meds  Medication Sig  . iron polysaccharides (FERREX 150) 150 MG capsule TAKE 1 CAPSULE BY MOUTH EVERY DAY  . metoCLOPramide (REGLAN) 10 MG tablet Take 1 tablet (10 mg total) by mouth 3 (three) times daily before meals.  Marland Kitchen omeprazole (PRILOSEC) 40 MG capsule Take 1 tablet by mouth twice daily, before breakfast and before dinner.  Marland Kitchen QUEtiapine (SEROQUEL) 300 MG tablet Take 600 mg by mouth at  bedtime.  Marland Kitchen SAPHRIS 10 MG SUBL Take 20 mg by mouth at bedtime.   Past Medical History:  Diagnosis Date  . Achalasia of esophagus   . Depression   . Gastroparesis, suspected, question vagal nerve injury 03/25/2019  . GERD with stricture   . Schizoaffective disorder    Past Surgical History:  Procedure Laterality Date  . BALLOON DILATION  02/12/2012   Procedure: BALLOON DILATION;  Surgeon: Gatha Mayer, MD;  Location: [facility];  Service: Endoscopy;  Laterality: N/A;  . COLONOSCOPY N/A 10/06/2015   Procedure: COLONOSCOPY;  Surgeon: Manus Gunning, MD;  Location: [facility];  Service: Gastroenterology;  Laterality: N/A;  . ESOPHAGOGASTRODUODENOSCOPY N/A 10/06/2015   Procedure: ESOPHAGOGASTRODUODENOSCOPY (EGD);  Surgeon: Manus Gunning, MD;  Location: [facility];  Service: Gastroenterology;  Laterality: N/A;  . ESOPHAGOGASTRODUODENOSCOPY (EGD) WITH ESOPHAGEAL DILATION  08/22/12  . esophagram  08/31/2006  . HELLER MYOTOMY  02/2007 AND 11/2015   with Dor Fundoplication- Dr. Garner [facility]) twice  . PANENDOSCOPY  11/08/2006   with submucosal injection  . PANENDOSCOPY  03/09/2006   normal   Social History   Social History Narrative   Single   Lives w/ mom   Employed at [employer]   family history includes Diabetes in his father.   Review of Systems As above  Objective:   Physical Exam BP 112/72 (BP Location: Right Arm, Patient Position:  Sitting, Cuff Size: Normal)   Pulse (!) 106   Temp 97.7 F (36.5 C) (Oral)   Ht 5\' 8"  (1.727 m)   Wt 153 lb 8 oz (69.6 kg)   BMI 23.34 kg/m   No tardive dyskinesia

## 2019-03-25 NOTE — Assessment & Plan Note (Signed)
Stable improved after dilation of suspected peptic stricture related to previous surgery.  Symptomatically improved with diet management as well.

## 2019-03-25 NOTE — Patient Instructions (Signed)
  We have sent the following medications to your pharmacy for you to pick up at your convenience: Reglan 5 mg. Try this instead of your 10 mg and see if it works.   Follow up with Dr Carlean Purl in 2 months.    I appreciate the opportunity to care for you. Silvano Rusk, MD, [facility]

## 2019-03-25 NOTE — Assessment & Plan Note (Signed)
He has improved on metoclopramide.  No signs of toxicity.  I discussed how dose reduction and minimizing the use of that is in his best interest.  Diet modification seems to have helped.  We will try 5 mg metoclopramide.  If that is not working go back to 10.  See me in 2 months.  Consider domperidone though it could be tricky to get that and to make it affordable.  Monitor for toxicities which I discussed with him today.

## 2019-05-20 ENCOUNTER — Other Ambulatory Visit: Payer: Self-pay

## 2019-05-20 ENCOUNTER — Encounter: Payer: Self-pay | Admitting: Internal Medicine

## 2019-05-20 ENCOUNTER — Ambulatory Visit: Payer: Medicaid Other | Admitting: Internal Medicine

## 2019-05-20 ENCOUNTER — Other Ambulatory Visit (INDEPENDENT_AMBULATORY_CARE_PROVIDER_SITE_OTHER): Payer: Medicaid Other

## 2019-05-20 DIAGNOSIS — K222 Esophageal obstruction: Secondary | ICD-10-CM | POA: Diagnosis not present

## 2019-05-20 DIAGNOSIS — D500 Iron deficiency anemia secondary to blood loss (chronic): Secondary | ICD-10-CM

## 2019-05-20 DIAGNOSIS — K219 Gastro-esophageal reflux disease without esophagitis: Secondary | ICD-10-CM

## 2019-05-20 DIAGNOSIS — K3184 Gastroparesis: Secondary | ICD-10-CM | POA: Diagnosis not present

## 2019-05-20 LAB — CBC WITH DIFFERENTIAL/PLATELET
Basophils Absolute: 0 10*3/uL (ref 0.0–0.1)
Basophils Relative: 0.7 % (ref 0.0–3.0)
Eosinophils Absolute: 0 10*3/uL (ref 0.0–0.7)
Eosinophils Relative: 1.3 % (ref 0.0–5.0)
HCT: 42.9 % (ref 39.0–52.0)
Hemoglobin: 14.1 g/dL (ref 13.0–17.0)
Lymphocytes Relative: 33.4 % (ref 12.0–46.0)
Lymphs Abs: 1.3 10*3/uL (ref 0.7–4.0)
MCHC: 32.9 g/dL (ref 30.0–36.0)
MCV: 86.2 fl (ref 78.0–100.0)
Monocytes Absolute: 0.4 10*3/uL (ref 0.1–1.0)
Monocytes Relative: 9.6 % (ref 3.0–12.0)
Neutro Abs: 2.1 10*3/uL (ref 1.4–7.7)
Neutrophils Relative %: 55 % (ref 43.0–77.0)
Platelets: 302 10*3/uL (ref 150.0–400.0)
RBC: 4.97 Mil/uL (ref 4.22–5.81)
RDW: 14.6 % (ref 11.5–15.5)
WBC: 3.8 10*3/uL — ABNORMAL LOW (ref 4.0–10.5)

## 2019-05-20 LAB — FERRITIN: Ferritin: 16.9 ng/mL — ABNORMAL LOW (ref 22.0–322.0)

## 2019-05-20 MED ORDER — METOCLOPRAMIDE HCL 10 MG PO TABS: 10.0000 mg | ORAL_TABLET | Freq: Three times a day (TID) | ORAL | 1 refills | Status: DC

## 2019-05-20 NOTE — Assessment & Plan Note (Addendum)
Continue metaclopramide - ? Domperidone but doubt he could afford it Will ask psychiatrist if there is any way to change his long-acting antipsychotic.  Doubt possible.  I gave him a letter to take to [facility]

## 2019-05-20 NOTE — Assessment & Plan Note (Addendum)
Better on PPI and metaclopramide

## 2019-05-20 NOTE — Progress Notes (Signed)
   [name] 33 y.o. December 05, 1985 [id number]  Assessment & Plan:  Gastroparesis, suspected, question vagal nerve injury Continue metaclopramide - ? Domperidone but doubt he could afford it Will ask psychiatrist if there is any way to change his long-acting antipsychotic.  Doubt possible.  I gave him a letter to take to [facility]   GERD with stricture, status post Heller myotomy Better on PPI and metaclopramide   IDA (iron deficiency anemia) Labs CBC and ferritin today   CC: Alexander Alu, MD    Subjective:   Chief Complaint: Follow-up of GERD, achalasia and suspected gastroparesis  HPI [name] presents for follow-up reporting that he is now taking 10 mg Reglan before meals because he was having vomiting on the 5 mg dose.  He has not noticed any problems with that.  He sees psychiatry on December 3 he goes to the [facility].  Continues to work at [employer]. No Known Allergies Current Meds  Medication Sig  . iron polysaccharides (FERREX 150) 150 MG capsule TAKE 1 CAPSULE BY MOUTH EVERY DAY  . metoCLOPramide (REGLAN) 10 MG tablet Take 1 tablet (10 mg total) by mouth 3 (three) times daily before meals.  Marland Kitchen omeprazole (PRILOSEC) 40 MG capsule Take 1 tablet by mouth twice daily, before breakfast and before dinner.  Marland Kitchen QUEtiapine (SEROQUEL) 300 MG tablet Take 600 mg by mouth at bedtime.  Marland Kitchen SAPHRIS 10 MG SUBL Take 20 mg by mouth at bedtime.  .   metoCLOPramide  10 MG tablet Take 1 tablet (10 mg total) by mouth 3 (three) times daily before meals.   Past Medical History:  Diagnosis Date  . Achalasia of esophagus   . Depression   . Gastroparesis, suspected, question vagal nerve injury 03/25/2019  . GERD with stricture   . Schizoaffective disorder    Past Surgical History:  Procedure Laterality Date  . BALLOON DILATION  02/12/2012   Procedure: BALLOON DILATION;  Surgeon: Gatha Mayer, MD;  Location: [facility];  Service: Endoscopy;  Laterality: N/A;  . COLONOSCOPY  N/A 10/06/2015   Procedure: COLONOSCOPY;  Surgeon: Manus Gunning, MD;  Location: [facility];  Service: Gastroenterology;  Laterality: N/A;  . ESOPHAGOGASTRODUODENOSCOPY N/A 10/06/2015   Procedure: ESOPHAGOGASTRODUODENOSCOPY (EGD);  Surgeon: Manus Gunning, MD;  Location: [facility];  Service: Gastroenterology;  Laterality: N/A;  . ESOPHAGOGASTRODUODENOSCOPY (EGD) WITH ESOPHAGEAL DILATION  08/22/12  . esophagram  08/31/2006  . HELLER MYOTOMY  02/2007 AND 11/2015   with Dor Fundoplication- Dr. Garner [facility]) twice  . PANENDOSCOPY  11/08/2006   with submucosal injection  . PANENDOSCOPY  03/09/2006   normal   Social History   Social History Narrative   Single   Lives w/ mom   Employed at [employer]   family history includes Diabetes in his father.   Review of Systems As per HPI  Objective:   Physical Exam BP 110/70   Pulse 99   Temp 98.5 F (36.9 C)   Ht 5\' 8"  (1.727 m)   Wt 157 lb (71.2 kg)   BMI 23.87 kg/m  He is in no acute distress No tremors observed no tardive dyskinesia He does have a little bit of akathisia type movement but this is not new and predates use of metoclopramide

## 2019-05-20 NOTE — Assessment & Plan Note (Addendum)
Labs CBC and ferritin today

## 2019-05-20 NOTE — Patient Instructions (Addendum)
We are going to check labs on your anemia today and I have a letter for you to take to your psychiatrist about the medications.  We have sent the following medications to your pharmacy for you to pick up at your convenience: Generic reglan  Please follow up with Dr Carlean Purl in February.   I appreciate the opportunity to care for you. Silvano Rusk, MD, [facility]

## 2019-05-26 ENCOUNTER — Other Ambulatory Visit: Payer: Self-pay | Admitting: Internal Medicine

## 2019-05-26 ENCOUNTER — Telehealth: Payer: Self-pay | Admitting: Internal Medicine

## 2019-05-26 DIAGNOSIS — D500 Iron deficiency anemia secondary to blood loss (chronic): Secondary | ICD-10-CM

## 2019-05-26 MED ORDER — POLYSACCHARIDE IRON COMPLEX 150 MG PO CAPS: 150.0000 mg | ORAL_CAPSULE | Freq: Two times a day (BID) | ORAL | 3 refills | Status: DC

## 2019-05-26 NOTE — Telephone Encounter (Signed)
Pt wants to know how to take ferrex. He stated that his dose was just increase to 2 pills a day. He wants to know if he should take both at the same time or 1 in the morning and the other one in the evening. Pls call him.

## 2019-05-26 NOTE — Progress Notes (Signed)
CBC is ok - normal blood counts but his iron level is low (ferritin)  Please remind him and make sure he is taking ferrous sulfate 325 mg daily.  Also ask him to return to our lab in the first week of march to get a blood count and iron level checked - I already put the orders in

## 2019-05-26 NOTE — Telephone Encounter (Signed)
Patient notified New rx sent  

## 2019-05-26 NOTE — Telephone Encounter (Signed)
Patient has been taking Ferrex 150 mg.  Do you want him to take 2 or change to another rx?

## 2019-05-26 NOTE — Telephone Encounter (Signed)
Have him take bid

## 2019-05-29 ENCOUNTER — Telehealth: Payer: Self-pay | Admitting: Internal Medicine

## 2019-07-23 ENCOUNTER — Other Ambulatory Visit: Payer: Self-pay | Admitting: Internal Medicine

## 2019-08-12 ENCOUNTER — Encounter: Payer: Self-pay | Admitting: Internal Medicine

## 2019-08-12 ENCOUNTER — Other Ambulatory Visit: Payer: Self-pay

## 2019-08-12 ENCOUNTER — Ambulatory Visit: Payer: Medicaid Other | Admitting: Internal Medicine

## 2019-08-12 ENCOUNTER — Other Ambulatory Visit (INDEPENDENT_AMBULATORY_CARE_PROVIDER_SITE_OTHER): Payer: Medicaid Other

## 2019-08-12 VITALS — BP 118/60 | HR 104 | Temp 98.7°F | Ht 68.0 in | Wt 164.8 lb

## 2019-08-12 DIAGNOSIS — K220 Achalasia of cardia: Secondary | ICD-10-CM

## 2019-08-12 DIAGNOSIS — D500 Iron deficiency anemia secondary to blood loss (chronic): Secondary | ICD-10-CM

## 2019-08-12 DIAGNOSIS — K3184 Gastroparesis: Secondary | ICD-10-CM | POA: Diagnosis not present

## 2019-08-12 DIAGNOSIS — D509 Iron deficiency anemia, unspecified: Secondary | ICD-10-CM | POA: Diagnosis not present

## 2019-08-12 DIAGNOSIS — K219 Gastro-esophageal reflux disease without esophagitis: Secondary | ICD-10-CM | POA: Diagnosis not present

## 2019-08-12 DIAGNOSIS — K222 Esophageal obstruction: Secondary | ICD-10-CM

## 2019-08-12 LAB — CBC
HCT: 44.2 % (ref 39.0–52.0)
Hemoglobin: 14.4 g/dL (ref 13.0–17.0)
MCHC: 32.7 g/dL (ref 30.0–36.0)
MCV: 86.5 fl (ref 78.0–100.0)
Platelets: 295 10*3/uL (ref 150.0–400.0)
RBC: 5.11 Mil/uL (ref 4.22–5.81)
RDW: 14.1 % (ref 11.5–15.5)
WBC: 4.1 10*3/uL (ref 4.0–10.5)

## 2019-08-12 LAB — FERRITIN: Ferritin: 25.4 ng/mL (ref 22.0–322.0)

## 2019-08-12 NOTE — Assessment & Plan Note (Signed)
Seems to be doing well on current regimen will continue omeprazole and metoclopramide

## 2019-08-12 NOTE — Assessment & Plan Note (Signed)
Continue iron supplementation and follow-up CBC and ferritin today

## 2019-08-12 NOTE — Assessment & Plan Note (Signed)
Seems to be doing well overall.  Continue metoclopramide.  Follow-up me in about 6 months sooner as needed.  Unable to come off current antipsychotics and no good other options to treat his vomiting.

## 2019-08-12 NOTE — Progress Notes (Signed)
[name] 34 y.o. 1986-02-26 [id number]  Assessment & Plan:  GERD with stricture, status post Heller myotomy Seems to be doing well on current regimen will continue omeprazole and metoclopramide  Gastroparesis, suspected, question vagal nerve injury Seems to be doing well overall.  Continue metoclopramide.  Follow-up me in about 6 months sooner as needed.  Unable to come off current antipsychotics and no good other options to treat his vomiting.  IDA (iron deficiency anemia) Continue iron supplementation and follow-up CBC and ferritin today   I appreciate the opportunity to care for this patient. CC: Kathrene Alu, MD    Subjective:   Chief Complaint: Follow-up of GERD gastroparesis and achalasia as well as anemia  HPI [name] is here for follow-up of suspected gastroparesis perhaps due to vagal nerve injury as it started after achalasia surgery, and GERD with stricture status post myotomy for achalasia.  He reports that he is doing well.  He notes no side effects from the Reglan he is experimenting and noting that if he takes it about 2 hours before a meal he seems to do better.  He is also trying to follow the appropriate gastroparesis diet much less vomiting and is actually gained some weight.  He continues to take his iron for iron deficiency anemia.  His antipsychotic medications are stable and they really could not be changed.  He got symptoms off of them and had to restart.  Was hoping to minimize risk for tardive dyskinesia and extrapyramidal symptoms.  No Known Allergies Current Meds  Medication Sig  . Asenapine Maleate (SAPHRIS) 10 MG SUBL Place under the tongue at bedtime. Takes 2 at bedtime  . iron polysaccharides (FERREX 150) 150 MG capsule Take 1 capsule (150 mg total) by mouth 2 (two) times daily.  . metoCLOPramide (REGLAN) 10 MG tablet TAKE 1 TABLET (10 MG TOTAL) BY MOUTH 3 (THREE) TIMES DAILY BEFORE MEALS.  Marland Kitchen omeprazole (PRILOSEC) 40 MG capsule Take 1  tablet by mouth twice daily, before breakfast and before dinner.  Marland Kitchen QUEtiapine (SEROQUEL) 300 MG tablet Take 600 mg by mouth at bedtime.   Past Medical History:  Diagnosis Date  . Achalasia of esophagus   . Depression   . Gastroparesis, suspected, question vagal nerve injury 03/25/2019  . GERD with stricture   . Schizoaffective disorder    Past Surgical History:  Procedure Laterality Date  . BALLOON DILATION  02/12/2012   Procedure: BALLOON DILATION;  Surgeon: Gatha Mayer, MD;  Location: [facility];  Service: Endoscopy;  Laterality: N/A;  . COLONOSCOPY N/A 10/06/2015   Procedure: COLONOSCOPY;  Surgeon: Manus Gunning, MD;  Location: [place];  Service: Gastroenterology;  Laterality: N/A;  . ESOPHAGOGASTRODUODENOSCOPY N/A 10/06/2015   Procedure: ESOPHAGOGASTRODUODENOSCOPY (EGD);  Surgeon: Manus Gunning, MD;  Location: [place];  Service: Gastroenterology;  Laterality: N/A;  . ESOPHAGOGASTRODUODENOSCOPY (EGD) WITH ESOPHAGEAL DILATION  08/22/12  . esophagram  08/31/2006  . HELLER MYOTOMY  02/2007 AND 11/2015   with Dor Fundoplication- Dr. Garner Nash [facility]) twice  . PANENDOSCOPY  11/08/2006   with submucosal injection  . PANENDOSCOPY  03/09/2006   normal   Social History   Social History Narrative   Single   Lives w/ mom   Employed at [employer]   family history includes Diabetes in his father.   Review of Systems As per HPI  Objective:   Physical Exam BP 118/60   Pulse (!) 104   Temp 98.7 F (37.1 C)   Ht 5'  8" (1.727 m)   Wt 164 lb 12.8 oz (74.8 kg)   SpO2 96%   BMI 25.06 kg/m  No acute distress

## 2019-08-12 NOTE — Progress Notes (Signed)
   [name] 34 y.o. [birth year] [id number]  Assessment & Plan:      Subjective:   Chief Complaint:  HPI   Wt Readings from Last 3 Encounters:  08/12/19 164 lb 12.8 oz (74.8 kg)  05/20/19 157 lb (71.2 kg)  03/25/19 153 lb 8 oz (69.6 kg)    No Known Allergies Current Meds  Medication Sig  . Asenapine Maleate (SAPHRIS) 10 MG SUBL Place under the tongue at bedtime. Takes 2 at bedtime  . iron polysaccharides (FERREX 150) 150 MG capsule Take 1 capsule (150 mg total) by mouth 2 (two) times daily.  . metoCLOPramide (REGLAN) 10 MG tablet TAKE 1 TABLET (10 MG TOTAL) BY MOUTH 3 (THREE) TIMES DAILY BEFORE MEALS.  Marland Kitchen omeprazole (PRILOSEC) 40 MG capsule Take 1 tablet by mouth twice daily, before breakfast and before dinner.  Marland Kitchen QUEtiapine (SEROQUEL) 300 MG tablet Take 600 mg by mouth at bedtime.   Past Medical History:  Diagnosis Date  . Achalasia of esophagus   . Depression   . Gastroparesis, suspected, question vagal nerve injury 03/25/2019  . GERD with stricture   . Schizoaffective disorder    Past Surgical History:  Procedure Laterality Date  . BALLOON DILATION  02/12/2012   Procedure: BALLOON DILATION;  Surgeon: Gatha Mayer, MD;  Location: [facility];  Service: Endoscopy;  Laterality: N/A;  . COLONOSCOPY N/A 10/06/2015   Procedure: COLONOSCOPY;  Surgeon: Manus Gunning, MD;  Location: [place];  Service: Gastroenterology;  Laterality: N/A;  . ESOPHAGOGASTRODUODENOSCOPY N/A 10/06/2015   Procedure: ESOPHAGOGASTRODUODENOSCOPY (EGD);  Surgeon: Manus Gunning, MD;  Location: [place];  Service: Gastroenterology;  Laterality: N/A;  . ESOPHAGOGASTRODUODENOSCOPY (EGD) WITH ESOPHAGEAL DILATION  08/22/12  . esophagram  08/31/2006  . HELLER MYOTOMY  02/2007 AND 11/2015   with Dor Fundoplication- Dr. Garner [facility]) twice  . PANENDOSCOPY  11/08/2006   with submucosal injection  . PANENDOSCOPY  03/09/2006   normal   Social History   Social History  Narrative   Single   Lives w/ mom   Employed at [employer]   family history includes Diabetes in his father.   Review of Systems   Objective:   Physical Exam

## 2019-08-12 NOTE — Patient Instructions (Addendum)
Glad things are going well.  Continue current medications.  Go to lab and do blood work to and check blood counts and iron  Let's see each other around August or September, sooner if needed.  I appreciate the opportunity to care for you. Gatha Mayer, MD, [facility]

## 2019-08-13 ENCOUNTER — Other Ambulatory Visit: Payer: Self-pay | Admitting: Internal Medicine

## 2019-08-13 DIAGNOSIS — D509 Iron deficiency anemia, unspecified: Secondary | ICD-10-CM

## 2019-08-16 ENCOUNTER — Other Ambulatory Visit: Payer: Self-pay | Admitting: Internal Medicine

## 2019-08-21 ENCOUNTER — Other Ambulatory Visit: Payer: Self-pay | Admitting: Internal Medicine

## 2019-09-20 ENCOUNTER — Other Ambulatory Visit: Payer: Self-pay | Admitting: Internal Medicine

## 2019-10-02 ENCOUNTER — Ambulatory Visit: Payer: Medicaid Other | Attending: Family

## 2019-10-02 DIAGNOSIS — Z23 Encounter for immunization: Secondary | ICD-10-CM

## 2019-10-02 NOTE — Progress Notes (Signed)
   Covid-19 Vaccination Clinic  Name:  Tasha Bussiere    MRN: [id number] DOB: 06-20-1986  10/02/2019  Mr. Biggins was observed post Covid-19 immunization for 15 minutes without incident. He was provided with Vaccine Information Sheet and instruction to access the V-Safe system.   Mr. Curro was instructed to call 911 with any severe reactions post vaccine: Marland Kitchen Difficulty breathing  . Swelling of face and throat  . A fast heartbeat  . A bad rash all over body  . Dizziness and weakness   Immunizations Administered    Name Date Dose VIS Date Route   Moderna COVID-19 Vaccine 10/02/2019 10:44 AM 0.5 mL 05/27/2019 Intramuscular   Manufacturer: Moderna   Lot: [id number]   [id number]

## 2019-10-19 ENCOUNTER — Other Ambulatory Visit: Payer: Self-pay | Admitting: Internal Medicine

## 2019-11-04 ENCOUNTER — Ambulatory Visit: Payer: Medicaid Other | Attending: Family

## 2019-11-04 DIAGNOSIS — Z23 Encounter for immunization: Secondary | ICD-10-CM

## 2019-11-04 NOTE — Progress Notes (Signed)
   Covid-19 Vaccination Clinic  Name:  Soloman Dube    MRN: [id number] DOB: [birth year]  11/04/2019  Mr. Banther was observed post Covid-19 immunization for 15 minutes without incident. He was provided with Vaccine Information Sheet and instruction to access the V-Safe system.   Mr. Everhart was instructed to call 911 with any severe reactions post vaccine: Marland Kitchen Difficulty breathing  . Swelling of face and throat  . A fast heartbeat  . A bad rash all over body  . Dizziness and weakness   Immunizations Administered    Name Date Dose VIS Date Route   Moderna COVID-19 Vaccine 11/04/2019 10:21 AM 0.5 mL 05/2019 Intramuscular   Manufacturer: Moderna   Lot: [id number]   [id number]

## 2019-11-10 ENCOUNTER — Other Ambulatory Visit: Payer: Self-pay | Admitting: Internal Medicine

## 2019-12-03 ENCOUNTER — Other Ambulatory Visit: Payer: Self-pay | Admitting: Family Medicine

## 2019-12-05 ENCOUNTER — Other Ambulatory Visit: Payer: Self-pay | Admitting: Internal Medicine

## 2020-01-15 ENCOUNTER — Encounter: Payer: Self-pay | Admitting: Internal Medicine

## 2020-01-15 ENCOUNTER — Ambulatory Visit: Payer: Medicaid Other | Admitting: Internal Medicine

## 2020-01-15 ENCOUNTER — Other Ambulatory Visit (INDEPENDENT_AMBULATORY_CARE_PROVIDER_SITE_OTHER): Payer: Medicaid Other

## 2020-01-15 VITALS — BP 120/82 | HR 107 | Ht 68.0 in | Wt 170.0 lb

## 2020-01-15 DIAGNOSIS — K220 Achalasia of cardia: Secondary | ICD-10-CM | POA: Diagnosis not present

## 2020-01-15 DIAGNOSIS — K3184 Gastroparesis: Secondary | ICD-10-CM

## 2020-01-15 DIAGNOSIS — K219 Gastro-esophageal reflux disease without esophagitis: Secondary | ICD-10-CM | POA: Diagnosis not present

## 2020-01-15 DIAGNOSIS — D500 Iron deficiency anemia secondary to blood loss (chronic): Secondary | ICD-10-CM

## 2020-01-15 DIAGNOSIS — K222 Esophageal obstruction: Secondary | ICD-10-CM

## 2020-01-15 LAB — CBC
HCT: 37.9 % — ABNORMAL LOW (ref 39.0–52.0)
Hemoglobin: 12.5 g/dL — ABNORMAL LOW (ref 13.0–17.0)
MCHC: 33 g/dL (ref 30.0–36.0)
MCV: 87.9 fl (ref 78.0–100.0)
Platelets: 329 10*3/uL (ref 150.0–400.0)
RBC: 4.31 Mil/uL (ref 4.22–5.81)
RDW: 14.8 % (ref 11.5–15.5)
WBC: 3.7 10*3/uL — ABNORMAL LOW (ref 4.0–10.5)

## 2020-01-15 LAB — FERRITIN: Ferritin: 8.6 ng/mL — ABNORMAL LOW (ref 22.0–322.0)

## 2020-01-15 NOTE — Progress Notes (Signed)
Brooke Payes 34 y.o. [birth year] [id number]  Assessment & Plan:  Gastroparesis, suspected, question vagal nerve injury Continue metoclopramide.  This helps significantly and no side effects at this point.  IDA (iron deficiency anemia) CBC and ferritin today, these returned with numbers as below Lab Results  Component Value Date   FERRITIN 8.6 (L) 01/15/2020   Lab Results  Component Value Date   WBC 3.7 (L) 01/15/2020   HGB 12.5 (L) 01/15/2020   HCT 37.9 (L) 01/15/2020   MCV 87.9 01/15/2020   PLT 329.0 01/15/2020   We will call him to double check that he is taking his iron.  GERD with stricture, status post Heller myotomy Continue PPI  ACHALASIA Stable.  Consider EGD next year for cancer screening      Subjective:   Chief Complaint: Achalasia, reflux gastroparesis  HPI Kipling presents for follow-up, last seen in February he has a syndrome of gastroparesis after 2 Heller myotomy surgeries and also GERD with stricture after these surgeries for achalasia.  He is maintained on PPI twice daily and also takes metoclopramide 3 times daily.  He is not suffering any problems every once in a while he will regurgitate he is not experiencing dysphagia like he has in the past when we did esophageal dilations.  He is working about 20 hours a week if she eats.  His antipsychotic medication was changed.  At 1 point I wondered if that previous medication might be contributing to his vomiting and gastroparetic situation. No Known Allergies Current Meds  Medication Sig  . FERREX 150 150 MG capsule TAKE 1 CAPSULE BY MOUTH TWICE A DAY  . metoCLOPramide (REGLAN) 10 MG tablet TAKE 1 TABLET (10 MG TOTAL) BY MOUTH 3 (THREE) TIMES DAILY BEFORE MEALS.  Marland Kitchen omeprazole (PRILOSEC) 40 MG capsule TAKE 1 TABLET BY MOUTH TWICE DAILY, BEFORE BREAKFAST AND BEFORE DINNER.  Marland Kitchen oxcarbazepine (TRILEPTAL) 600 MG tablet Take 600 mg by mouth 2 (two) times daily.  . QUEtiapine (SEROQUEL) 300 MG tablet Take  600 mg by mouth at bedtime.   Past Medical History:  Diagnosis Date  . Achalasia of esophagus   . Depression   . Gastroparesis, suspected, question vagal nerve injury 03/25/2019  . GERD with stricture   . Schizoaffective disorder    Past Surgical History:  Procedure Laterality Date  . BALLOON DILATION  02/12/2012   Procedure: BALLOON DILATION;  Surgeon: Gatha Mayer, MD;  Location: [facility];  Service: Endoscopy;  Laterality: N/A;  . COLONOSCOPY N/A 10/06/2015   Procedure: COLONOSCOPY;  Surgeon: Manus Gunning, MD;  Location: [place];  Service: Gastroenterology;  Laterality: N/A;  . ESOPHAGOGASTRODUODENOSCOPY N/A 10/06/2015   Procedure: ESOPHAGOGASTRODUODENOSCOPY (EGD);  Surgeon: Manus Gunning, MD;  Location: [place];  Service: Gastroenterology;  Laterality: N/A;  . ESOPHAGOGASTRODUODENOSCOPY (EGD) WITH ESOPHAGEAL DILATION  08/22/12  . esophagram  08/31/2006  . HELLER MYOTOMY  02/2007 AND 11/2015   with Dor Fundoplication- Dr. Garner Nash [facility]) twice  . PANENDOSCOPY  11/08/2006   with submucosal injection  . PANENDOSCOPY  03/09/2006   normal   Social History   Social History Narrative   Single   Lives w/ mom   Employed at [employer]   family history includes Diabetes in his father.   Review of Systems As above Objective:   Physical Exam BP 120/82   Pulse (!) 107   Ht 5\' 8"  (1.727 m)   Wt 170 lb (77.1 kg)   BMI 25.85 kg/m  Well-developed well-nourished no  acute distress Lungs clear Normal heart sounds Abdomen is soft and nontender without organomegaly or mass There are no signs of tardive dyskinesia

## 2020-01-15 NOTE — Patient Instructions (Signed)
Glad to hear things are going well.  Please go to lab in basement today - will make sure iron and blood count are ok and let you know.  See me in 6 months but call back sooner if any trouble.  I appreciate the opportunity to care for you. Gatha Mayer, MD, [facility]

## 2020-01-15 NOTE — Assessment & Plan Note (Signed)
CBC and ferritin today, these returned with numbers as below Lab Results  Component Value Date   FERRITIN 8.6 (L) 01/15/2020   Lab Results  Component Value Date   WBC 3.7 (L) 01/15/2020   HGB 12.5 (L) 01/15/2020   HCT 37.9 (L) 01/15/2020   MCV 87.9 01/15/2020   PLT 329.0 01/15/2020   We will call him to double check that he is taking his iron.

## 2020-01-15 NOTE — Assessment & Plan Note (Signed)
Continue metoclopramide.  This helps significantly and no side effects at this point.

## 2020-01-15 NOTE — Assessment & Plan Note (Signed)
Stable.  Consider EGD next year for cancer screening

## 2020-01-15 NOTE — Assessment & Plan Note (Signed)
Continue PPI ?

## 2020-01-16 ENCOUNTER — Other Ambulatory Visit: Payer: Self-pay | Admitting: Internal Medicine

## 2020-01-16 DIAGNOSIS — D500 Iron deficiency anemia secondary to blood loss (chronic): Secondary | ICD-10-CM

## 2020-01-18 ENCOUNTER — Other Ambulatory Visit: Payer: Self-pay | Admitting: Internal Medicine

## 2020-01-19 ENCOUNTER — Telehealth: Payer: Self-pay | Admitting: Internal Medicine

## 2020-01-19 MED ORDER — METOCLOPRAMIDE HCL 10 MG PO TABS: 10.0000 mg | ORAL_TABLET | Freq: Three times a day (TID) | ORAL | 1 refills | Status: DC

## 2020-01-19 NOTE — Telephone Encounter (Signed)
Pt states per his pharmacy he needs pre approval to fill his Reglan medication.

## 2020-01-19 NOTE — Telephone Encounter (Signed)
I called the pharmacy and they could not find where I sent his reglan in earlier today so I have resent it. I called and informed [name] and he wants to come by and pick up reading information on gastroparesis. I have gathered pamphlets.

## 2020-01-29 ENCOUNTER — Telehealth: Payer: Self-pay | Admitting: Internal Medicine

## 2020-01-29 NOTE — Telephone Encounter (Signed)
Spoke with patient, he is scheduled for nursing pre-visit on 02/02/20 at 4:30 pm, pt scheduled for propofol EGD with possible dilation on 02/06/20 at 4:30 pm.

## 2020-01-29 NOTE — Telephone Encounter (Signed)
I spoke to him as well.  It sounds like mostly regurgitating in the mornings only.  This has developed since recent office visit.  I explained that I should set him up for an endoscopy with possible esophageal dilation again.     Plan is:  Schedule endoscopy with possible esophageal dilation  Since he has achalasia I would like him on clear liquids the day before his procedure and then standard n.p.o. practice the day of procedure  He has been vaccinated for Covid

## 2020-01-29 NOTE — Telephone Encounter (Signed)
Spoke with patient , he reports that he has been vomiting everyday for the past few weeks, describes vomit as brown, has a "sour taste", chunky and liquid. Pt states that he is able to digest his food, when he drinks right after he eats is when he vomits. Pt states that he eats yogurt every morning, he states that he took Prilosec this morning and started to eat yogurt and had to vomit, still brown in color. Pt denies any blood in vomit that he has noticed, nausea, diarrhea, or constipation. Pt states that he is still taking all medications as directed. Please advise, thank you

## 2020-02-02 ENCOUNTER — Ambulatory Visit (AMBULATORY_SURGERY_CENTER): Payer: Self-pay | Admitting: *Deleted

## 2020-02-02 ENCOUNTER — Other Ambulatory Visit: Payer: Self-pay

## 2020-02-02 VITALS — Ht 68.0 in | Wt 165.0 lb

## 2020-02-02 DIAGNOSIS — K222 Esophageal obstruction: Secondary | ICD-10-CM

## 2020-02-02 DIAGNOSIS — K219 Gastro-esophageal reflux disease without esophagitis: Secondary | ICD-10-CM

## 2020-02-02 NOTE — Progress Notes (Signed)

## 2020-02-05 ENCOUNTER — Encounter: Payer: Self-pay | Admitting: Certified Registered Nurse Anesthetist

## 2020-02-06 ENCOUNTER — Other Ambulatory Visit: Payer: Self-pay

## 2020-02-06 ENCOUNTER — Ambulatory Visit (AMBULATORY_SURGERY_CENTER): Payer: Medicaid Other | Admitting: Internal Medicine

## 2020-02-06 ENCOUNTER — Encounter: Payer: Self-pay | Admitting: Internal Medicine

## 2020-02-06 VITALS — BP 139/73 | HR 81 | Temp 99.1°F | Resp 15 | Ht 68.0 in | Wt 165.0 lb

## 2020-02-06 DIAGNOSIS — R1310 Dysphagia, unspecified: Secondary | ICD-10-CM | POA: Diagnosis not present

## 2020-02-06 DIAGNOSIS — K220 Achalasia of cardia: Secondary | ICD-10-CM

## 2020-02-06 DIAGNOSIS — K222 Esophageal obstruction: Secondary | ICD-10-CM | POA: Diagnosis not present

## 2020-02-06 DIAGNOSIS — K219 Gastro-esophageal reflux disease without esophagitis: Secondary | ICD-10-CM | POA: Diagnosis not present

## 2020-02-06 MED ORDER — SODIUM CHLORIDE 0.9 % IV SOLN: 500.0000 mL | Freq: Once | INTRAVENOUS | Status: DC

## 2020-02-06 NOTE — Patient Instructions (Addendum)
There was some retained food and fluid in the esophagus - and the junction of the esophagus and stomach was narrowed. I dilated that.  Fluid and food in stomach also.  Similar to that in past.  Please stay on a liquid diet today and try very soft foods tomorrow and add solids on Sunday if ok on Saturday.  Please call back and let me know how it is going after a week and sooner if not getting better.  I appreciate the opportunity to care for you. Gatha Mayer, MD, FACG   YOU HAD AN ENDOSCOPIC PROCEDURE TODAY AT [facility]:   Refer to the procedure report that was given to you for any specific questions about what was found during the examination.  If the procedure report does not answer your questions, please call your gastroenterologist to clarify.  If you requested that your care partner not be given the details of your procedure findings, then the procedure report has been included in a sealed envelope for you to review at your convenience later.  YOU SHOULD EXPECT: Some feelings of bloating in the abdomen. Passage of more gas than usual.  Walking can help get rid of the air that was put into your GI tract during the procedure and reduce the bloating. If you had a lower endoscopy (such as a colonoscopy or flexible sigmoidoscopy) you may notice spotting of blood in your stool or on the toilet paper. If you underwent a bowel prep for your procedure, you may not have a normal bowel movement for a few days.  Please Note:  You might notice some irritation and congestion in your nose or some drainage.  This is from the oxygen used during your procedure.  There is no need for concern and it should clear up in a day or so.  SYMPTOMS TO REPORT IMMEDIATELY:     Following upper endoscopy (EGD)  Vomiting of blood or coffee ground material  New chest pain or pain under the shoulder blades  Painful or persistently difficult swallowing  New shortness of breath  Fever of 100F or  higher  Black, tarry-looking stools  For urgent or emergent issues, a gastroenterologist can be reached at any hour by calling [phone]. Do not use MyChart messaging for urgent concerns.    DIET:  FOLLOW DIET AS INSTRUCTED ABOVE BY DR Carlean Purl.  Drink plenty of fluids but you should avoid alcoholic beverages for 24 hours.  ACTIVITY:  You should plan to take it easy for the rest of today and you should NOT DRIVE or use heavy machinery until tomorrow (because of the sedation medicines used during the test).    FOLLOW UP: Our staff will call the number listed on your records 48-72 hours following your procedure to check on you and address any questions or concerns that you may have regarding the information given to you following your procedure. If we do not reach you, we will leave a message.  We will attempt to reach you two times.  During this call, we will ask if you have developed any symptoms of COVID 19. If you develop any symptoms (ie: fever, flu-like symptoms, shortness of breath, cough etc.) before then, please call [phone].  If you test positive for Covid 19 in the 2 weeks post procedure, please call and report this information to [place].    If any biopsies were taken you will be contacted by phone or by letter within the next 1-3 weeks.  Please  call us at [phone] if you have not heard about the biopsies in 3 weeks.    SIGNATURES/CONFIDENTIALITY: You and/or your care partner have signed paperwork which will be entered into your electronic medical record.  These signatures attest to the fact that that the information above on your After Visit Summary has been reviewed and is understood.  Full responsibility of the confidentiality of this discharge information lies with you and/or your care-partner.

## 2020-02-06 NOTE — Op Note (Signed)
[facility] Patient Name: [name] Procedure Date: 02/06/2020 4:00 PM MRN: [id number] Endoscopist: Gatha Mayer , MD Age: 34 Referring MD:  Date of Birth: 16-Jan-1986 Gender: Male Account #: [id number] Procedure:                Upper GI endoscopy Indications:              Dysphagia Medicines:                Propofol per Anesthesia, Monitored Anesthesia Care Procedure:                Pre-Anesthesia Assessment:                           - Prior to the procedure, a History and Physical                            was performed, and patient medications and                            allergies were reviewed. The patient's tolerance of                            previous anesthesia was also reviewed. The risks                            and benefits of the procedure and the sedation                            options and risks were discussed with the patient.                            All questions were answered, and informed consent                            was obtained. Prior Anticoagulants: The patient has                            taken no previous anticoagulant or antiplatelet                            agents. ASA Grade Assessment: III - A patient with                            severe systemic disease. After reviewing the risks                            and benefits, the patient was deemed in                            satisfactory condition to undergo the procedure.                           After obtaining informed consent, the endoscope was  passed under direct vision. Throughout the                            procedure, the patient's blood pressure, pulse, and                            oxygen saturations were monitored continuously. The                            Endoscope was introduced through the mouth, and                            advanced to the second part of duodenum. The upper                            GI endoscopy was  somewhat difficult due to presence                            of food. The patient tolerated the procedure well. Scope In: Scope Out: Findings:                 The lumen of the esophagus was severely dilated.                           Food was found in the lower third of the esophagus.                           One benign-appearing, intrinsic moderate stenosis                            was found at the gastroesophageal junction. The                            stenosis was traversed. A TTS dilator was passed                            through the scope. Dilation with an 18-19-20 mm                            balloon dilator was performed to 20 mm. The                            dilation site was examined and showed mild mucosal                            disruption. Estimated blood loss was minimal.                           Moderately severe esophagitis was found in the                            lower third of the esophagus.  A small amount of food (residue) was found in the                            gastric fundus, in the gastric body and in the                            gastric antrum.                           The examined duodenum was normal. Complications:            No immediate complications. Estimated Blood Loss:     Estimated blood loss was minimal. Impression:               - Dilation in the entire esophagus. No motility                            seen - achalasia                           - Food in the lower third of the esophagus.                            moderate - corn seen - some was advanced into                            stomach with scope                           - Benign-appearing esophageal stenosis. Dilated. 20                            mm                           - Moderately severe esophagitis. stasis type                           - A small amount of food (residue) in the stomach.                            No significant  gastric motility seen                           - Normal examined duodenum. gastric retroflexion                            without other abnormality                           - No specimens collected. Recommendation:           - Patient has a contact number available for                            emergencies. The signs and symptoms of potential  delayed complications were discussed with the                            patient. Return to normal activities tomorrow.                            Written discharge instructions were provided to the                            patient.                           - Clear liquid diet today.                           - advance diet to soft tomorrow and try solids in 2                            days                           - call back in a week to let Dr. Carlean Purl know how                            swallowing is doing                           sooner if not getting better Gatha Mayer, MD 02/06/2020 4:43:54 PM This report has been signed electronically.

## 2020-02-06 NOTE — Progress Notes (Signed)
Pt's states no medical or surgical changes since previsit or office visit. 

## 2020-02-06 NOTE — Progress Notes (Signed)
Robinul 0.1 mg IV given due large amount of secretions upon assessment.  MD made aware, vss 

## 2020-02-06 NOTE — Progress Notes (Signed)
Report given to PACU, vss 

## 2020-02-06 NOTE — Progress Notes (Signed)
Called to room to assist during endoscopic procedure.  Patient ID and intended procedure confirmed with present staff. Received instructions for my participation in the procedure from the performing physician.  

## 2020-02-09 ENCOUNTER — Telehealth: Payer: Self-pay | Admitting: Internal Medicine

## 2020-02-09 NOTE — Telephone Encounter (Signed)
His mail box is full and not accepting messages at this time. I will try again tomorrow.

## 2020-02-09 NOTE — Telephone Encounter (Signed)
Would not do more than bid

## 2020-02-09 NOTE — Telephone Encounter (Signed)
We have orders in to recheck his blood work early September. On his iron BID now. Please advise Sir?

## 2020-02-10 ENCOUNTER — Telehealth: Payer: Self-pay

## 2020-02-10 NOTE — Telephone Encounter (Signed)
Patient informed. 

## 2020-02-10 NOTE — Telephone Encounter (Signed)
°  Follow up Call-  Call back number 02/06/2020 02/07/2019 01/14/2019  Post procedure Call Back phone  # [phone]  Permission to leave phone message Yes Yes Yes  Some recent data might be hidden     Patient questions:  Do you have a fever, pain , or abdominal swelling? No. Pain Score  0 *  Have you tolerated food without any problems? Yes.    Have you been able to return to your normal activities? Yes.    Do you have any questions about your discharge instructions: Diet   No. Medications  No. Follow up visit  No.  Do you have questions or concerns about your Care? No.  Actions: * If pain score is 4 or above: 1. No action needed, pain <4.Have you developed a fever since your procedure? no  2.   Have you had an respiratory symptoms (SOB or cough) since your procedure? no  3.   Have you tested positive for COVID 19 since your procedure no  4.   Have you had any family members/close contacts diagnosed with the COVID 19 since your procedure?  no   If yes to any of these questions please route to Joylene John, RN and Joella Prince, RN

## 2020-02-17 ENCOUNTER — Telehealth: Payer: Self-pay | Admitting: Internal Medicine

## 2020-02-17 DIAGNOSIS — K220 Achalasia of cardia: Secondary | ICD-10-CM

## 2020-02-17 NOTE — Addendum Note (Signed)
Addended by: Valerie Roys on: 02/17/2020 02:38 PM   Modules accepted: Orders

## 2020-02-17 NOTE — Telephone Encounter (Signed)
I spoke Jazmin at Dr. Loistine Chance office.  I will fax her the information that needs to be sent in the referral and she will attach the referral and send to [facility], Referral info faxed to [phone]

## 2020-02-17 NOTE — Telephone Encounter (Signed)
Left message with mom about Michale going to [facility] multidisciplinary clinic re: achalasia  Contact at [facility] is [place]  Please fax referral and recent EGd and notes to her at [phone]  I checked with them and they take his Medicaid though I am thinking may need his PCP to approve it since [employer]

## 2020-02-18 ENCOUNTER — Other Ambulatory Visit: Payer: Self-pay | Admitting: Family Medicine

## 2020-02-18 NOTE — Addendum Note (Signed)
Addended by: Lanice Shirts on: 02/18/2020 09:56 AM   Modules accepted: Orders

## 2020-04-22 ENCOUNTER — Ambulatory Visit: Payer: Medicaid Other

## 2020-05-08 ENCOUNTER — Other Ambulatory Visit: Payer: Self-pay | Admitting: Internal Medicine

## 2020-05-25 ENCOUNTER — Ambulatory Visit: Payer: Medicaid Other | Attending: Internal Medicine

## 2020-05-25 DIAGNOSIS — Z23 Encounter for immunization: Secondary | ICD-10-CM

## 2020-05-25 NOTE — Progress Notes (Signed)
   Covid-19 Vaccination Clinic  Name:  [name]    MRN: [id number] DOB: May 23, 1986  05/25/2020  [name] was observed post Covid-19 immunization for 15 minutes without incident. He was provided with Vaccine Information Sheet and instruction to access the V-Safe system.   [name] was instructed to call 911 with any severe reactions post vaccine: Marland Kitchen Difficulty breathing  . Swelling of face and throat  . A fast heartbeat  . A bad rash all over body  . Dizziness and weakness   Immunizations Administered    No immunizations on file.

## 2020-06-06 ENCOUNTER — Other Ambulatory Visit: Payer: Self-pay | Admitting: Internal Medicine

## 2020-06-06 DIAGNOSIS — D500 Iron deficiency anemia secondary to blood loss (chronic): Secondary | ICD-10-CM

## 2020-06-07 NOTE — Telephone Encounter (Signed)
Refill Sir?

## 2020-06-08 NOTE — Telephone Encounter (Signed)
Okay to refill Please asked [name] to also come in and have a ferritin and a CBC checked Diagnosis is iron deficiency anemia  Also ask him how his swallowing is doing and does he have an appointment to go to [place]?

## 2020-06-08 NOTE — Telephone Encounter (Signed)
Tried to reach him and his voice mail is full, unable to leave a message. I will try again later.

## 2020-06-09 NOTE — Telephone Encounter (Signed)
Tried to reach [name] and it says voicemail full that you cannot leave a message.

## 2020-06-10 ENCOUNTER — Other Ambulatory Visit (INDEPENDENT_AMBULATORY_CARE_PROVIDER_SITE_OTHER): Payer: Medicaid Other

## 2020-06-10 DIAGNOSIS — D500 Iron deficiency anemia secondary to blood loss (chronic): Secondary | ICD-10-CM | POA: Diagnosis not present

## 2020-06-10 LAB — CBC WITH DIFFERENTIAL/PLATELET
Basophils Absolute: 0 10*3/uL (ref 0.0–0.1)
Basophils Relative: 0.5 % (ref 0.0–3.0)
Eosinophils Absolute: 0.1 10*3/uL (ref 0.0–0.7)
Eosinophils Relative: 1.4 % (ref 0.0–5.0)
HCT: 39.1 % (ref 39.0–52.0)
Hemoglobin: 12.9 g/dL — ABNORMAL LOW (ref 13.0–17.0)
Lymphocytes Relative: 29 % (ref 12.0–46.0)
Lymphs Abs: 1.1 10*3/uL (ref 0.7–4.0)
MCHC: 32.9 g/dL (ref 30.0–36.0)
MCV: 86.7 fl (ref 78.0–100.0)
Monocytes Absolute: 0.4 10*3/uL (ref 0.1–1.0)
Monocytes Relative: 9 % (ref 3.0–12.0)
Neutro Abs: 2.4 10*3/uL (ref 1.4–7.7)
Neutrophils Relative %: 60.1 % (ref 43.0–77.0)
Platelets: 344 10*3/uL (ref 150.0–400.0)
RBC: 4.52 Mil/uL (ref 4.22–5.81)
RDW: 15.3 % (ref 11.5–15.5)
WBC: 3.9 10*3/uL — ABNORMAL LOW (ref 4.0–10.5)

## 2020-06-10 LAB — FERRITIN: Ferritin: 16.9 ng/mL — ABNORMAL LOW (ref 22.0–322.0)

## 2020-06-10 NOTE — Telephone Encounter (Signed)
I spoke with [name] and told him to come for lab work, orders entered. He said he has been "throwing up a lot" but that he doesn't want an EGD. He said he has not set up a [facility] appointment yet. I refilled his iron.

## 2020-06-10 NOTE — Telephone Encounter (Signed)
I have left his [place] Amy # [phone] a voicemail to please tell Herbert to call us.

## 2020-06-15 ENCOUNTER — Telehealth: Payer: Self-pay

## 2020-06-15 NOTE — Telephone Encounter (Signed)
Left message for patient to please call back. 

## 2020-06-15 NOTE — Telephone Encounter (Signed)
-----   Message from Gatha Mayer, MD sent at 06/14/2020 12:24 PM EST ----- Let [name] know that his iron level remains low his hemoglobin is almost normal, fortunately.    Stay on iron supplementation  Have him schedule a follow-up with me in 2022 either January or February please

## 2020-06-15 NOTE — Telephone Encounter (Signed)
See result note.  

## 2020-06-15 NOTE — Telephone Encounter (Signed)
Inbound call from patient returning your call. 

## 2020-06-24 ENCOUNTER — Other Ambulatory Visit: Payer: Self-pay | Admitting: Internal Medicine

## 2020-07-27 ENCOUNTER — Encounter: Payer: Self-pay | Admitting: Internal Medicine

## 2020-07-27 ENCOUNTER — Ambulatory Visit: Payer: Medicaid Other | Admitting: Internal Medicine

## 2020-07-27 ENCOUNTER — Other Ambulatory Visit (INDEPENDENT_AMBULATORY_CARE_PROVIDER_SITE_OTHER): Payer: Medicaid Other

## 2020-07-27 ENCOUNTER — Other Ambulatory Visit: Payer: Self-pay

## 2020-07-27 VITALS — BP 126/68 | HR 70 | Ht 68.0 in | Wt 180.0 lb

## 2020-07-27 DIAGNOSIS — K3184 Gastroparesis: Secondary | ICD-10-CM | POA: Diagnosis not present

## 2020-07-27 DIAGNOSIS — K219 Gastro-esophageal reflux disease without esophagitis: Secondary | ICD-10-CM

## 2020-07-27 DIAGNOSIS — D500 Iron deficiency anemia secondary to blood loss (chronic): Secondary | ICD-10-CM

## 2020-07-27 DIAGNOSIS — K220 Achalasia of cardia: Secondary | ICD-10-CM

## 2020-07-27 DIAGNOSIS — K222 Esophageal obstruction: Secondary | ICD-10-CM

## 2020-07-27 LAB — CBC WITH DIFFERENTIAL/PLATELET
Basophils Absolute: 0 10*3/uL (ref 0.0–0.1)
Basophils Relative: 0.4 % (ref 0.0–3.0)
Eosinophils Absolute: 0.1 10*3/uL (ref 0.0–0.7)
Eosinophils Relative: 1.9 % (ref 0.0–5.0)
HCT: 38.5 % — ABNORMAL LOW (ref 39.0–52.0)
Hemoglobin: 13 g/dL (ref 13.0–17.0)
Lymphocytes Relative: 24.3 % (ref 12.0–46.0)
Lymphs Abs: 1 10*3/uL (ref 0.7–4.0)
MCHC: 33.7 g/dL (ref 30.0–36.0)
MCV: 87.3 fl (ref 78.0–100.0)
Monocytes Absolute: 0.4 10*3/uL (ref 0.1–1.0)
Monocytes Relative: 9.6 % (ref 3.0–12.0)
Neutro Abs: 2.6 10*3/uL (ref 1.4–7.7)
Neutrophils Relative %: 63.8 % (ref 43.0–77.0)
Platelets: 312 10*3/uL (ref 150.0–400.0)
RBC: 4.42 Mil/uL (ref 4.22–5.81)
RDW: 15.7 % — ABNORMAL HIGH (ref 11.5–15.5)
WBC: 4.1 10*3/uL (ref 4.0–10.5)

## 2020-07-27 LAB — FERRITIN: Ferritin: 20 ng/mL — ABNORMAL LOW (ref 22.0–322.0)

## 2020-07-27 NOTE — Progress Notes (Signed)
[name] 35 y.o. 08-12-1985 [id number]  Assessment & Plan:   Encounter Diagnoses  Name Primary?  . Iron deficiency anemia due to chronic blood loss +/- nutritional Yes  . ACHALASIA   . GERD with stricture, status post Heller myotomy   . Gastroparesis, suspected, question vagal nerve injury      I will consider trying to get him back to the [facility] multidisciplinary clinic.  I am going to discuss with [facility] colleagues when I see them in an upcoming meeting.  He would need referral through primary care.  Refer to hematology to help manage chronic iron deficiency anemia that I think is coming from upper GI blood losses plus or minus poor absorption since he is on PPI and also nutritional.  He has had a colonoscopy in the past which was negative.  It would be difficult to perform capsule endoscopy given his delayed stomach emptying and achalasia issues though should we do it we could overcome that by placing it endoscopically.  I am not convinced it would be likely to result in a change in treatment.    Subjective:   Chief Complaint: Follow-up of achalasia iron deficiency anemia reflux  HPI [name] is here for follow-up of his achalasia and subsequent GERD stricture status post Heller myotomy x2, along with suspected gastroparesis and iron deficiency anemia problems.  He reports that he is still regurgitating and vomiting black fluid at times though overall he is tolerating his diet much better. Last EGD as below 02/06/2020 EGD Dilation in the entire esophagus. No motility seen - achalasia - Food in the lower third of the esophagus. moderate - corn seen - some was advanced into stomach with scope - Benign-appearing esophageal stenosis. Dilated. 20 mm - Moderately severe esophagitis. stasis type - A small amount of food (residue) in the stomach. No significant gastric motility seen - Normal examined duodenum. gastric retroflexion without other abnormality - No specimens  collected.  After this long discussion with mom and patient in recovery and the plan was to go to [facility] multidisciplinary clinic.  Also dietary advice obtained from Internet and provided to mom.  Since that time he reports his mom has been cooking weekly she is now retired from [employer], he is eating things like spaghetti with Alfredo sauce mixed vegetables, spinach which is added for iron lately, lima beans mashed potatoes.  He has been gaining weight.  He is not noticing any bleeding per se.  He does take his iron.  He continues to work at [employer] on [address].  He never did go to [facility] that did not come together as far as going to the multidisciplinary clinic there. Wt Readings from Last 3 Encounters:  07/27/20 180 lb (81.6 kg)  02/06/20 165 lb (74.8 kg)  02/02/20 165 lb (74.8 kg)   CBC Latest Ref Rng & Units 06/10/2020 01/15/2020 08/12/2019  WBC 4.0 - 10.5 K/uL 3.9(L) 3.7(L) 4.1  Hemoglobin 13.0 - 17.0 g/dL 12.9(L) 12.5(L) 14.4  Hematocrit 39.0 - 52.0 % 39.1 37.9(L) 44.2  Platelets 150.0 - 400.0 K/uL 344.0 329.0 295.0   Lab Results  Component Value Date   FERRITIN 16.9 (L) 06/10/2020    No Known Allergies Current Meds  Medication Sig  . FERREX 150 150 MG capsule TAKE 1 CAPSULE BY MOUTH TWICE A DAY  . metoCLOPramide (REGLAN) 10 MG tablet TAKE 1 TABLET BY MOUTH 3 TIMES DAILY BEFORE MEALS.  Marland Kitchen omeprazole (PRILOSEC) 40 MG capsule TAKE 1 TABLET BY MOUTH TWICE DAILY,  BEFORE BREAKFAST AND BEFORE DINNER.  Marland Kitchen oxcarbazepine (TRILEPTAL) 600 MG tablet Take 600 mg by mouth 2 (two) times daily.  Marland Kitchen PRESCRIPTION MEDICATION ? Name of med for neck pain-  PRN  . QUEtiapine (SEROQUEL) 300 MG tablet Take 600 mg by mouth at bedtime.   Past Medical History:  Diagnosis Date  . Achalasia of esophagus   . Anemia   . Blood transfusion without reported diagnosis   . Depression   . Gastroparesis, suspected, question vagal nerve injury 03/25/2019  . GERD with stricture   . Schizoaffective disorder   .  Seizures ([place])    Last seizure 4-5 yrs ago - caused by Wellbutrin    Past Surgical History:  Procedure Laterality Date  . BALLOON DILATION  02/12/2012   Procedure: BALLOON DILATION;  Surgeon: Gatha Mayer, MD;  Location: [facility];  Service: Endoscopy;  Laterality: N/A;  . COLONOSCOPY N/A 10/06/2015   Procedure: COLONOSCOPY;  Surgeon: Manus Gunning, MD;  Location: [place];  Service: Gastroenterology;  Laterality: N/A;  . COLONOSCOPY    . ESOPHAGOGASTRODUODENOSCOPY N/A 10/06/2015   Procedure: ESOPHAGOGASTRODUODENOSCOPY (EGD);  Surgeon: Manus Gunning, MD;  Location: [place];  Service: Gastroenterology;  Laterality: N/A;  . ESOPHAGOGASTRODUODENOSCOPY (EGD) WITH ESOPHAGEAL DILATION  08/22/12  . esophagram  08/31/2006  . HELLER MYOTOMY  02/2007 AND 11/2015   with Dor Fundoplication- Dr. Garner [facility]) twice  . PANENDOSCOPY  11/08/2006   with submucosal injection  . PANENDOSCOPY  03/09/2006   normal  . UPPER GASTROINTESTINAL ENDOSCOPY     Social History   Social History Narrative   Single   Lives w/ mom   Employed at [employer]   family history includes Diabetes in his father.   Review of Systems As per HPI  Objective:   Physical Exam BP 126/68   Pulse 70   Ht 5\' 8"  (1.727 m)   Wt 180 lb (81.6 kg)   BMI 27.37 kg/m  Well-developed well-nourished black man no acute distress Lungs are clear Heart sounds are normal Abdomen is soft nontender without organomegaly or mass

## 2020-07-27 NOTE — Patient Instructions (Signed)

## 2020-08-03 ENCOUNTER — Other Ambulatory Visit: Payer: Self-pay

## 2020-08-03 DIAGNOSIS — D500 Iron deficiency anemia secondary to blood loss (chronic): Secondary | ICD-10-CM

## 2020-08-04 ENCOUNTER — Telehealth: Payer: Self-pay | Admitting: Hematology

## 2020-08-04 NOTE — Telephone Encounter (Signed)
Received a new hem referral from Dr. Carlean Purl for IDA. Mr. [name] has been cld and scheduled to see Dr. Irene Limbo on 2/14 at 1pm. Pt aware to arrive 20 minutes early

## 2020-08-06 ENCOUNTER — Telehealth: Payer: Self-pay | Admitting: Internal Medicine

## 2020-08-06 NOTE — Telephone Encounter (Signed)
Patient called has concerns with his stomach said it's sticking out and he does not know why. Has appointment to follow up on 08/19/20 with APP. Seeking advise in the mean time.

## 2020-08-06 NOTE — Telephone Encounter (Signed)
Left message for patient to call back  

## 2020-08-08 NOTE — Progress Notes (Signed)
HEMATOLOGY/ONCOLOGY CONSULTATION NOTE  Date of Service: 08/09/2020  Patient Care Team: Group, [facility] as PCP - General (Internal Medicine)  CHIEF COMPLAINTS/PURPOSE OF CONSULTATION:  IDA  HISTORY OF PRESENTING ILLNESS:   [name] is a wonderful 35 y.o. male who has been referred to [place] by Dr. Carlean Purl, MD  for evaluation and management of iron deficiency anemia. The pt reports that he is doing well overall.  The pt reports that he was referred by his GI for anemia over the last few years. The pt's iron levels have been lower over the last three years. The pt reports that he has gained weight since his last endoscopy, eating a more balanced diet. The pt reports that he does not feel weak or tired. The pt has returned to his baseline of 186 pounds from being as low as 140-150 pounds. The pt reports that he experiences constipation and hard stools, and his bowel movements are only regular after drinking this tea that helps him have normal stools. The pt notes the feeling and appearance of bloating in his stomach. He notes that he feels the food stays in his stomach longer after eating, feeling very gaseous. The pt does take daily iron pills. The pt notes he does take the Reglan TID before his meals.   The pt notes that he currently eats a yogurt at 5 am before work, lunch at 4 pm, and snacks throughout day if needed.  The pt denies any history of allergies to medications.  On review of systems, pt reports abdominal bloating, constipation and denies fatigue, bloody/black stools, abdominal pain, back pain, fevers, chills, night sweats, flank pain, problems passing urine, and any other symptoms.  MEDICAL HISTORY:  Past Medical History:  Diagnosis Date  . Achalasia of esophagus   . Anemia   . Blood transfusion without reported diagnosis   . Depression   . Gastroparesis, suspected, question vagal nerve injury 03/25/2019  . GERD with stricture   . Schizoaffective disorder   .  Seizures ([place])    Last seizure 4-5 yrs ago - caused by Wellbutrin     SURGICAL HISTORY: Past Surgical History:  Procedure Laterality Date  . BALLOON DILATION  02/12/2012   Procedure: BALLOON DILATION;  Surgeon: Gatha Mayer, MD;  Location: [facility];  Service: Endoscopy;  Laterality: N/A;  . COLONOSCOPY N/A 10/06/2015   Procedure: COLONOSCOPY;  Surgeon: Manus Gunning, MD;  Location: [place];  Service: Gastroenterology;  Laterality: N/A;  . COLONOSCOPY    . ESOPHAGOGASTRODUODENOSCOPY N/A 10/06/2015   Procedure: ESOPHAGOGASTRODUODENOSCOPY (EGD);  Surgeon: Manus Gunning, MD;  Location: [place];  Service: Gastroenterology;  Laterality: N/A;  . ESOPHAGOGASTRODUODENOSCOPY (EGD) WITH ESOPHAGEAL DILATION  08/22/12  . esophagram  08/31/2006  . HELLER MYOTOMY  02/2007 AND 11/2015   with Dor Fundoplication- Dr. Garner Nash [facility]) twice  . PANENDOSCOPY  11/08/2006   with submucosal injection  . PANENDOSCOPY  03/09/2006   normal  . UPPER GASTROINTESTINAL ENDOSCOPY      SOCIAL HISTORY: Social History   Socioeconomic History  . Marital status: Single    Spouse name: Not on file  . Number of children: 0  . Years of education: Not on file  . Highest education level: Not on file  Occupational History  . Occupation: [employer]  . Occupation: [employer] [address]  Tobacco Use  . Smoking status: Former Smoker    Packs/day: 0.50    Years: 2.00    Pack years: 1.00    Types:  E-cigarettes  . Smokeless tobacco: Never Used  . Tobacco comment: given counseling sheet 01-26-12, pt started e-sig  Vaping Use  . Vaping Use: Every day  . Start date: 10/24/2013  . Substances: Nicotine  Substance and Sexual Activity  . Alcohol use: No  . Drug use: No  . Sexual activity: Not on file  Other Topics Concern  . Not on file  Social History Narrative   Single   Lives w/ mom   Employed at [employer] Determinants of Health   Financial Resource Strain: Not on file   Food Insecurity: Not on file  Transportation Needs: Not on file  Physical Activity: Not on file  Stress: Not on file  Social Connections: Not on file  Intimate Partner Violence: Not on file    FAMILY HISTORY: Family History  Problem Relation Age of Onset  . Diabetes Father   . Colon cancer Neg Hx   . Stomach cancer Neg Hx   . Pancreatic cancer Neg Hx   . Rectal cancer Neg Hx   . Heart disease Neg Hx   . Colon polyps Neg Hx     ALLERGIES:  has No Known Allergies.  MEDICATIONS:  Current Outpatient Medications  Medication Sig Dispense Refill  . FERREX 150 150 MG capsule TAKE 1 CAPSULE BY MOUTH TWICE A DAY 60 capsule 2  . metoCLOPramide (REGLAN) 10 MG tablet TAKE 1 TABLET BY MOUTH 3 TIMES DAILY BEFORE MEALS. 90 tablet 1  . omeprazole (PRILOSEC) 40 MG capsule TAKE 1 TABLET BY MOUTH TWICE DAILY, BEFORE BREAKFAST AND BEFORE DINNER. 60 capsule 11  . oxcarbazepine (TRILEPTAL) 600 MG tablet Take 600 mg by mouth 2 (two) times daily.    Marland Kitchen PRESCRIPTION MEDICATION ? Name of med for neck pain-  PRN    . QUEtiapine (SEROQUEL) 300 MG tablet Take 600 mg by mouth at bedtime.  1   No current facility-administered medications for this visit.    REVIEW OF SYSTEMS:   10 Point review of Systems was done is negative except as noted above.  PHYSICAL EXAMINATION: ECOG PERFORMANCE STATUS: 0 - Asymptomatic  . Vitals:   08/09/20 1320  BP: (!) 142/89  Pulse: (!) 109  Resp: 18  Temp: (!) 97.5 F (36.4 C)  SpO2: 99%   Filed Weights   08/09/20 1320  Weight: 186 lb 4.8 oz (84.5 kg)   .Body mass index is 28.33 kg/m.  GENERAL:alert, in no acute distress and comfortable SKIN: no acute rashes, no significant lesions EYES: conjunctiva are pink and non-injected, sclera anicteric OROPHARYNX: MMM, no exudates, no oropharyngeal erythema or ulceration NECK: supple, no JVD LYMPH:  no palpable lymphadenopathy in the cervical, axillary or inguinal regions LUNGS: clear to auscultation b/l with  normal respiratory effort HEART: regular rate & rhythm ABDOMEN:  normoactive bowel sounds , non tender, not distended. Extremity: no pedal edema PSYCH: alert & oriented x 3 with fluent speech NEURO: no focal motor/sensory deficits  LABORATORY DATA:  I have reviewed the data as listed  . CBC Latest Ref Rng & Units 07/27/2020 06/10/2020 01/15/2020  WBC 4.0 - 10.5 K/uL 4.1 3.9(L) 3.7(L)  Hemoglobin 13.0 - 17.0 g/dL 13.0 12.9(L) 12.5(L)  Hematocrit 39.0 - 52.0 % 38.5(L) 39.1 37.9(L)  Platelets 150.0 - 400.0 K/uL 312.0 344.0 329.0    . CMP Latest Ref Rng & Units 01/17/2019 05/10/2017 03/20/2017  Glucose 70 - 99 mg/dL 87 76 98  BUN 6 - 23 mg/dL 8 7 10   Creatinine 0.40 -  1.50 mg/dL 1.03 1.05 1.24  Sodium 135 - 145 mEq/L 140 143 139  Potassium 3.5 - 5.1 mEq/L 3.9 5.1 4.7  Chloride 96 - 112 mEq/L 102 104 100  CO2 19 - 32 mEq/L 28 26 28   Calcium 8.4 - 10.5 mg/dL 10.0 9.4 9.9  Total Protein 6.0 - 8.3 g/dL 7.6 - 7.0  Total Bilirubin 0.2 - 1.2 mg/dL 0.5 - 0.9  Alkaline Phos 39 - 117 U/L 48 - 57  AST 0 - 37 U/L 21 - 17  ALT 0 - 53 U/L 23 - 13   . Lab Results  Component Value Date   IRON 102 02/25/2016   TIBC 342 02/25/2016   IRONPCTSAT 30 02/25/2016   (Iron and TIBC)  Lab Results  Component Value Date   FERRITIN 20.0 (L) 07/27/2020      RADIOGRAPHIC STUDIES: I have personally reviewed the radiological images as listed and agreed with the findings in the report. No results found.  ASSESSMENT & PLAN:   35 yo with   1) Iron deficiency anemia  Likely due to chronic GI losses. PLAN: -Advised pt that Prilosec and acid reflux can decrease absorption of iron.  -Advised pt to address the constipation to help with abdominal bloating. Recommended smaller, more frequent meals to reduce bloating.  -Advised pt to stop OTC Iron and switch to IV Iron to ease constipation. -Advised pt that his blood counts are normal and he is not iron deficient enough to the point where it would affect  his blood counts or fatigue. -Will re-evaluate iron levels following IV Iron to observe how often the pt needs it to be replaced. -Continue daily Multivitamin, Vitamin D, Omega-3.  -Will get IV iron ASAP. Advised pt we will re-evaluate his levels following and monitor iron demands as needed. -Will see back 3 months following completion of IV iron with labs.  FOLLOW UP: IV Injectafer weekly x 2 doses RTC with Dr Irene Limbo in 3 months  All of the patients questions were answered with apparent satisfaction. The patient knows to call the clinic with any problems, questions or concerns.  I spent 40 minutes counseling the patient face to face. The total time spent in the appointment was 45 minutes and more than 50% was on counseling and direct patient cares.    Sullivan Lone MD [place] AAHIVMS [facility] Hematology/Oncology Physician [facility]  (Office):       [phone] (Work cell):  [phone] (Fax):           [phone]  08/09/2020 1:27 PM  I, Reinaldo Raddle, am acting as scribe for Dr. Sullivan Lone, MD.  .I have reviewed the above documentation for accuracy and completeness, and I agree with the above. Brunetta Genera MD

## 2020-08-09 ENCOUNTER — Other Ambulatory Visit: Payer: Self-pay

## 2020-08-09 ENCOUNTER — Inpatient Hospital Stay: Payer: Medicaid Other | Attending: Hematology | Admitting: Hematology

## 2020-08-09 VITALS — BP 142/89 | HR 109 | Temp 97.5°F | Resp 18 | Ht 68.0 in | Wt 186.3 lb

## 2020-08-09 DIAGNOSIS — K219 Gastro-esophageal reflux disease without esophagitis: Secondary | ICD-10-CM | POA: Insufficient documentation

## 2020-08-09 DIAGNOSIS — D500 Iron deficiency anemia secondary to blood loss (chronic): Secondary | ICD-10-CM

## 2020-08-09 DIAGNOSIS — K5900 Constipation, unspecified: Secondary | ICD-10-CM | POA: Insufficient documentation

## 2020-08-09 DIAGNOSIS — Z79899 Other long term (current) drug therapy: Secondary | ICD-10-CM | POA: Insufficient documentation

## 2020-08-09 DIAGNOSIS — R140 Abdominal distension (gaseous): Secondary | ICD-10-CM | POA: Diagnosis not present

## 2020-08-09 DIAGNOSIS — F259 Schizoaffective disorder, unspecified: Secondary | ICD-10-CM | POA: Diagnosis not present

## 2020-08-09 DIAGNOSIS — D509 Iron deficiency anemia, unspecified: Secondary | ICD-10-CM | POA: Insufficient documentation

## 2020-08-09 DIAGNOSIS — Z833 Family history of diabetes mellitus: Secondary | ICD-10-CM | POA: Insufficient documentation

## 2020-08-09 DIAGNOSIS — Z87891 Personal history of nicotine dependence: Secondary | ICD-10-CM | POA: Insufficient documentation

## 2020-08-10 NOTE — Telephone Encounter (Signed)
No return call from the patient, no answer today.  I will await a return call .

## 2020-08-14 ENCOUNTER — Other Ambulatory Visit: Payer: Self-pay | Admitting: Internal Medicine

## 2020-08-16 ENCOUNTER — Other Ambulatory Visit: Payer: Self-pay

## 2020-08-16 ENCOUNTER — Inpatient Hospital Stay: Payer: Medicaid Other

## 2020-08-16 VITALS — BP 144/86 | HR 91 | Temp 97.8°F | Resp 18

## 2020-08-16 DIAGNOSIS — D500 Iron deficiency anemia secondary to blood loss (chronic): Secondary | ICD-10-CM

## 2020-08-16 DIAGNOSIS — D509 Iron deficiency anemia, unspecified: Secondary | ICD-10-CM | POA: Diagnosis not present

## 2020-08-16 MED ORDER — LORATADINE 10 MG PO TABS
ORAL_TABLET | ORAL | Status: AC
  Filled 2020-08-16: qty 1

## 2020-08-16 MED ORDER — SODIUM CHLORIDE 0.9 % IV SOLN
Freq: Once | INTRAVENOUS | Status: AC
  Filled 2020-08-16: qty 250

## 2020-08-16 MED ORDER — LORATADINE 10 MG PO TABS
10.0000 mg | ORAL_TABLET | Freq: Once | ORAL | Status: AC
  Administered 2020-08-16: 10 mg via ORAL

## 2020-08-16 MED ORDER — SODIUM CHLORIDE 0.9 % IV SOLN
750.0000 mg | Freq: Once | INTRAVENOUS | Status: AC
  Administered 2020-08-16: 750 mg via INTRAVENOUS
  Filled 2020-08-16: qty 15

## 2020-08-16 MED ORDER — ACETAMINOPHEN 325 MG PO TABS
ORAL_TABLET | ORAL | Status: AC
  Filled 2020-08-16: qty 2

## 2020-08-16 MED ORDER — ACETAMINOPHEN 325 MG PO TABS
650.0000 mg | ORAL_TABLET | Freq: Once | ORAL | Status: AC
  Administered 2020-08-16: 650 mg via ORAL

## 2020-08-16 NOTE — Patient Instructions (Signed)
[facility] Discharge Instructions for Patients Receiving Chemotherapy  Today you received the following chemotherapy agents Injectafer  To help prevent nausea and vomiting after your treatment, we encourage you to take your nausea medication as directed   If you develop nausea and vomiting that is not controlled by your nausea medication, call the clinic.   BELOW ARE SYMPTOMS THAT SHOULD BE REPORTED IMMEDIATELY:  *FEVER GREATER THAN 100.5 F  *CHILLS WITH OR WITHOUT FEVER  NAUSEA AND VOMITING THAT IS NOT CONTROLLED WITH YOUR NAUSEA MEDICATION  *UNUSUAL SHORTNESS OF BREATH  *UNUSUAL BRUISING OR BLEEDING  TENDERNESS IN MOUTH AND THROAT WITH OR WITHOUT PRESENCE OF ULCERS  *URINARY PROBLEMS  *BOWEL PROBLEMS  UNUSUAL RASH Items with * indicate a potential emergency and should be followed up as soon as possible.  Feel free to call the clinic should you have any questions or concerns. The clinic phone number is [phone].  Please show the [place] at check-in to the Emergency Department and triage nurse.  Ferric carboxymaltose injection What is this medicine? FERRIC CARBOXYMALTOSE (ferr-ik car-box-ee-mol-toes) is an iron complex. Iron is used to make healthy red blood cells, which carry oxygen and nutrients throughout the body. This medicine is used to treat anemia in people with chronic kidney disease or people who cannot take iron by mouth. This medicine may be used for other purposes; ask your health care provider or pharmacist if you have questions. COMMON BRAND NAME(S): Injectafer What should I tell my health care provider before I take this medicine? They need to know if you have any of these conditions:  high levels of iron in the blood  liver disease  an unusual or allergic reaction to iron, other medicines, foods, dyes, or preservatives  pregnant or trying to get pregnant  breast-feeding How should I use this medicine? This medicine is  for infusion into a vein. It is given by a health care professional in a hospital or clinic setting. Talk to your pediatrician regarding the use of this medicine in children. Special care may be needed. Overdosage: If you think you have taken too much of this medicine contact a poison control center or emergency room at once. NOTE: This medicine is only for you. Do not share this medicine with others. What if I miss a dose? Keep appointments for follow-up doses. It is important not to miss your dose. Call your care team if you are unable to keep an appointment. What may interact with this medicine? Do not take this medicine with any of the following medications:  deferoxamine  dimercaprol  other iron products This list may not describe all possible interactions. Give your health care provider a list of all the medicines, herbs, non-prescription drugs, or dietary supplements you use. Also tell them if you smoke, drink alcohol, or use illegal drugs. Some items may interact with your medicine. What should I watch for while using this medicine? Visit your doctor or health care professional regularly. Tell your doctor if your symptoms do not start to get better or if they get worse. You may need blood work done while you are taking this medicine. You may need to follow a special diet. Talk to your doctor. Foods that contain iron include: whole grains/cereals, dried fruits, beans, or peas, leafy green vegetables, and organ meats (liver, kidney). What side effects may I notice from receiving this medicine? Side effects that you should report to your doctor or health care professional as soon as possible:  allergic reactions like skin rash, itching or hives, swelling of the face, lips, or tongue  dizziness  facial flushing Side effects that usually do not require medical attention (report to your doctor or health care professional if they continue or are bothersome):  changes in  taste  constipation  headache  nausea, vomiting  pain, redness, or irritation at site where injected This list may not describe all possible side effects. Call your doctor for medical advice about side effects. You may report side effects to FDA at 1-800-FDA-1088. Where should I keep my medicine? This drug is given in a hospital or clinic and will not be stored at home. NOTE: This sheet is a summary. It may not cover all possible information. If you have questions about this medicine, talk to your doctor, pharmacist, or health care provider.  2021 Elsevier/Gold Standard (2020-05-25 14:00:47)

## 2020-08-19 ENCOUNTER — Encounter: Payer: Self-pay | Admitting: Nurse Practitioner

## 2020-08-19 ENCOUNTER — Other Ambulatory Visit (INDEPENDENT_AMBULATORY_CARE_PROVIDER_SITE_OTHER): Payer: Medicaid Other

## 2020-08-19 ENCOUNTER — Ambulatory Visit: Payer: Medicaid Other | Admitting: Nurse Practitioner

## 2020-08-19 VITALS — BP 112/64 | HR 70 | Ht 68.0 in | Wt 188.0 lb

## 2020-08-19 DIAGNOSIS — K5900 Constipation, unspecified: Secondary | ICD-10-CM

## 2020-08-19 DIAGNOSIS — K219 Gastro-esophageal reflux disease without esophagitis: Secondary | ICD-10-CM

## 2020-08-19 DIAGNOSIS — R140 Abdominal distension (gaseous): Secondary | ICD-10-CM

## 2020-08-19 DIAGNOSIS — K769 Liver disease, unspecified: Secondary | ICD-10-CM | POA: Diagnosis not present

## 2020-08-19 LAB — COMPREHENSIVE METABOLIC PANEL
ALT: 80 U/L — ABNORMAL HIGH (ref 0–53)
AST: 48 U/L — ABNORMAL HIGH (ref 0–37)
Albumin: 4 g/dL (ref 3.5–5.2)
Alkaline Phosphatase: 50 U/L (ref 39–117)
BUN: 7 mg/dL (ref 6–23)
CO2: 30 mEq/L (ref 19–32)
Calcium: 9 mg/dL (ref 8.4–10.5)
Chloride: 103 mEq/L (ref 96–112)
Creatinine, Ser: 1.05 mg/dL (ref 0.40–1.50)
GFR: 91.11 mL/min (ref >60.00)
Glucose, Bld: 82 mg/dL (ref 70–99)
Potassium: 3.8 mEq/L (ref 3.5–5.1)
Sodium: 140 mEq/L (ref 135–145)
Total Bilirubin: 0.3 mg/dL (ref 0.2–1.2)
Total Protein: 6.6 g/dL (ref 6.0–8.3)

## 2020-08-19 NOTE — Progress Notes (Signed)
08/19/2020 [name] [id number] 06-Dec-1985   Chief Complaint: Abdominal bloat   History of Present Illness: [name] is a 35 year old male with a past medial history of IDA, GERD with stricture s/p Heller myotomy, achalasia and suspected gastroparesis.  He was seen by Dr. Carlean Purl in office 07/27/2020 for GI follow-up.  At that time, he continued to have intermittent regurgitation and vomiting back fluids. He was advised to take Reglan before meals and at bed time.  He presents to our office today with complaints of abdominal bloat discomfort.  He feels extremely bloated after he eats any food.  No significant abdominal pain but the abdominal bloat causes a generalized discomfort.  He stated having constipation which was worse after he started taking Ferrex iron 150 mg 1 p.o. daily.  He is passing brown balls of stool daily, he does not feel emptied.  He used his mother's laxative tea 1 month ago which resulted in passing a larger formed bowel movement with relief.  No rectal bleeding or black stools.   He reported being more consistent with taking Reglan before meals and at bedtime and his regurgitation has significantly improved.  He has vomited a few times over the past week or 2.  No hematemesis or coffee-ground emesis.  He was seen by hematologist Dr. Irene Limbo 08/09/2020 for his IDA and he was advised to stop po iron which resulted in worsening constipation and IV was recommended. He received his first iron infusion on 08/16/2020.  He underwent an abdominal sonogram at [facility] 12/26/2018 which identified an irregular marginated 2.8cm focus of hyper echogenicity to the right hepatic lobe, possible hemangioma.  His LFTs were normal at that time.  EGD 02/06/2020: - Dilation in the entire esophagus. No motility seen - achalasia - Food in the lower third of the esophagus. moderate - corn seen - some was advanced into stomach with scope - Benign-appearing esophageal  stenosis. Dilated. 20 mm - Moderately severe esophagitis. stasis type - A small amount of food (residue) in the stomach. No significant gastric motility seen - Normal examined duodenum. gastric retroflexion without other abnormality - No specimens collected.  Colonoscopy 10/06/2015: The perianal and digital rectal examinations were normal. Non-bleeding internal hemorrhoids were found. The terminal ileum appeared normal. The exam was otherwise without abnormality. The colonic mucosa was normal without polyps, mass lesions, or inflammatory changes. Retroflexed views of the rectum not obtained due to narrow rectal vault.   Current Outpatient Medications on File Prior to Visit  Medication Sig Dispense Refill  . cyclobenzaprine (FLEXERIL) 10 MG tablet Take 10 mg by mouth 3 (three) times daily as needed.    Marland Kitchen FERREX 150 150 MG capsule TAKE 1 CAPSULE BY MOUTH TWICE A DAY 60 capsule 2  . FLUoxetine (PROZAC) 20 MG capsule Take 20 mg by mouth daily.    Marland Kitchen FLUoxetine (PROZAC) 40 MG capsule Take 40 mg by mouth every morning.    . meloxicam (MOBIC) 15 MG tablet Take 15 mg by mouth daily.    . metoCLOPramide (REGLAN) 10 MG tablet TAKE 1 TABLET BY MOUTH 3 TIMES DAILY BEFORE MEALS. 90 tablet 1  . omeprazole (PRILOSEC) 40 MG capsule TAKE 1 CAPSULE BY MOUTH TWICE A DAY BEFORE BREAKFAST AND BEFORE DINNER 60 capsule 1  . oxcarbazepine (TRILEPTAL) 600 MG tablet Take 600 mg by mouth 2 (two) times daily.    Marland Kitchen PRESCRIPTION MEDICATION ? Name of med for neck pain-  PRN    . QUEtiapine (  SEROQUEL) 300 MG tablet Take 600 mg by mouth at bedtime.  1   No current facility-administered medications on file prior to visit.   No Known Allergies  Current Medications, Allergies, Past Medical History, Past Surgical History, Family History and Social History were reviewed in [employer] record.   Review of Systems:   Constitutional: Negative for fever, sweats, chills or weight loss.  Respiratory:  Negative for shortness of breath.   Cardiovascular: Negative for chest pain, palpitations and leg swelling.  Gastrointestinal: See HPI.  Musculoskeletal: Negative for back pain or muscle aches.  Neurological: Negative for dizziness, headaches or paresthesias.    Physical Exam: BP 112/64   Pulse 70   Ht 5\' 8"  (1.727 m)   Wt 188 lb (85.3 kg)   BMI 28.59 kg/m  General: 35 year old male in no acute distress. Head: Normocephalic and atraumatic. Eyes: No scleral icterus. Conjunctiva pink . Ears: Normal auditory acuity. Mouth: Dentition intact. No ulcers or lesions.  Lungs: Clear throughout to auscultation. Heart: Regular rate and rhythm, no murmur. Abdomen: Soft. Distended abdomen, somewhat tympanic to percussion. Nontender. No masses or hepatomegaly. Normal bowel sounds x 4 quadrants.  Rectal: Deferred.  Musculoskeletal: Symmetrical with no gross deformities. Extremities: No edema. Neurological: Alert oriented x 4. No focal deficits.  Psychological: Alert and cooperative. Normal mood and affect  Assessment and Recommendations:  78.  35 year old male with GERD, stricture s/p Heller myotomy and achalasia.  Vomiting and regurgitation stable on Reglan 10 mg p.o. prior to meals and at bedtime  2.  Constipation with associated abdominal bloat.  Abdominal exam is moderately distended but nontender.  -MiraLAX nightly to increase stool output  3.  Abdominal sonogram 12/2018 identified an irregular marginated 2.8 cm focus of hyper echogenicity, possible hemangioma  -CMP -Right upper quadrant sonogram to reassess the 2.8 cm right liver lesion  4. IDA (mot likely chronic GI loss), on po iron and s/p IV iron infusion 08/16/2020.  Hg 14.4. Ferritin 25.4. No overt GI bleeding. See EGD and colonoscopy results above.  -Continue follow up with hematologist Dr Irene Limbo  -No plans for small bowel capsule endoscopy

## 2020-08-19 NOTE — Patient Instructions (Addendum)
If you are age 37 or younger, your body mass index should be between 19-25. Your Body mass index is 28.59 kg/m. If this is out of the aformentioned range listed, please consider follow up with your Primary Care Provider.   LABS:  Lab work has been ordered for you today. Our lab is located in the basement. Press "B" on the elevator. The lab is located at the first door on the left as you exit the elevator.  HEALTHCARE LAWS AND MY CHART RESULTS: Due to recent changes in healthcare laws, you may see the results of your imaging and laboratory studies on MyChart before your provider has had a chance to review them.   We understand that in some cases there may be results that are confusing or concerning to you. Not all laboratory results come back in the same time frame and the provider may be waiting for multiple results in order to interpret others.  Please give [place] 48 hours in order for your provider to thoroughly review all the results before contacting the office for clarification of your results.   OVER THE COUNTER MEDICATION Please purchase the following medications over the counter and take as directed:  Miralax. Dissolve one capful in 8 ounces of water and drink before bed.  IMAGING:  . You will be contacted by [facility] (Your caller ID will indicate phone # [phone]) in the next 2 days to schedule your Ultrasound of your liver. If you have not heard from them within 2 business days, please call [facility] at [phone] to follow up on the status of your appointment.    Please call our office if your symptoms worsen.  It was great seeing you today! Thank you for entrusting me with your care and choosing [facility].  Noralyn Pick, CRNP

## 2020-08-19 NOTE — Progress Notes (Signed)
Staff message note sent to scheduling:  [place] Phone: [phone] Fax: [phone]   Patient Name: [name] DOB: 06-Aug-1985 MRN #: [id number]  Imaging Ordered: RUQ [place]  Diagnosis: liver lesion follow up  Ordering Provider: Noralyn Pick, CRNP   Is a Prior Authorization needed? We are in the process of obtaining it now  Is the patient Diabetic? No  Does the patient have Hypertension? No  Does the patient have any implanted devices or hardware? No  Date of last BUN/Creat, if needed? 08/19/20  Patient Weight? 188lb  Is the patient able to get on the table? No  Has the patient been diagnosed with COVID? No  Is the patient waiting on COVID testing results? No  Thank you for your assistance! [facility]

## 2020-08-23 ENCOUNTER — Inpatient Hospital Stay: Payer: Medicaid Other

## 2020-08-23 ENCOUNTER — Other Ambulatory Visit: Payer: Self-pay | Admitting: Nurse Practitioner

## 2020-08-23 ENCOUNTER — Other Ambulatory Visit: Payer: Self-pay

## 2020-08-23 ENCOUNTER — Other Ambulatory Visit (INDEPENDENT_AMBULATORY_CARE_PROVIDER_SITE_OTHER): Payer: Medicaid Other

## 2020-08-23 VITALS — BP 132/89 | HR 100 | Temp 98.4°F | Resp 18 | Wt 191.2 lb

## 2020-08-23 DIAGNOSIS — R748 Abnormal levels of other serum enzymes: Secondary | ICD-10-CM

## 2020-08-23 DIAGNOSIS — D500 Iron deficiency anemia secondary to blood loss (chronic): Secondary | ICD-10-CM

## 2020-08-23 DIAGNOSIS — D509 Iron deficiency anemia, unspecified: Secondary | ICD-10-CM | POA: Diagnosis not present

## 2020-08-23 LAB — HEPATIC FUNCTION PANEL
ALT: 324 U/L — ABNORMAL HIGH (ref 0–53)
AST: 167 U/L — ABNORMAL HIGH (ref 0–37)
Albumin: 3.9 g/dL (ref 3.5–5.2)
Alkaline Phosphatase: 47 U/L (ref 39–117)
Bilirubin, Direct: 0 mg/dL (ref 0.0–0.3)
Total Bilirubin: 0.2 mg/dL (ref 0.2–1.2)
Total Protein: 6.6 g/dL (ref 6.0–8.3)

## 2020-08-23 MED ORDER — ACETAMINOPHEN 325 MG PO TABS
650.0000 mg | ORAL_TABLET | Freq: Once | ORAL | Status: AC
  Administered 2020-08-23: 650 mg via ORAL

## 2020-08-23 MED ORDER — SODIUM CHLORIDE 0.9 % IV SOLN
750.0000 mg | Freq: Once | INTRAVENOUS | Status: AC
  Administered 2020-08-23: 750 mg via INTRAVENOUS
  Filled 2020-08-23: qty 15

## 2020-08-23 MED ORDER — SODIUM CHLORIDE 0.9 % IV SOLN
Freq: Once | INTRAVENOUS | Status: AC
  Filled 2020-08-23: qty 250

## 2020-08-23 MED ORDER — ACETAMINOPHEN 325 MG PO TABS
ORAL_TABLET | ORAL | Status: AC
  Filled 2020-08-23: qty 2

## 2020-08-23 MED ORDER — LORATADINE 10 MG PO TABS
ORAL_TABLET | ORAL | Status: AC
  Filled 2020-08-23: qty 1

## 2020-08-23 MED ORDER — LORATADINE 10 MG PO TABS
10.0000 mg | ORAL_TABLET | Freq: Once | ORAL | Status: AC
  Administered 2020-08-23: 10 mg via ORAL

## 2020-08-23 NOTE — Patient Instructions (Signed)
[facility] Discharge Instructions for Patients Receiving Chemotherapy  Today you received the following chemotherapy agents Injectafer  To help prevent nausea and vomiting after your treatment, we encourage you to take your nausea medication as directed   If you develop nausea and vomiting that is not controlled by your nausea medication, call the clinic.   BELOW ARE SYMPTOMS THAT SHOULD BE REPORTED IMMEDIATELY:  *FEVER GREATER THAN 100.5 F  *CHILLS WITH OR WITHOUT FEVER  NAUSEA AND VOMITING THAT IS NOT CONTROLLED WITH YOUR NAUSEA MEDICATION  *UNUSUAL SHORTNESS OF BREATH  *UNUSUAL BRUISING OR BLEEDING  TENDERNESS IN MOUTH AND THROAT WITH OR WITHOUT PRESENCE OF ULCERS  *URINARY PROBLEMS  *BOWEL PROBLEMS  UNUSUAL RASH Items with * indicate a potential emergency and should be followed up as soon as possible.  Feel free to call the clinic should you have any questions or concerns. The clinic phone number is [phone].  Please show the [facility] at check-in to the Emergency Department and triage nurse.  Ferric carboxymaltose injection What is this medicine? FERRIC CARBOXYMALTOSE (ferr-ik car-box-ee-mol-toes) is an iron complex. Iron is used to make healthy red blood cells, which carry oxygen and nutrients throughout the body. This medicine is used to treat anemia in people with chronic kidney disease or people who cannot take iron by mouth. This medicine may be used for other purposes; ask your health care provider or pharmacist if you have questions. COMMON BRAND NAME(S): Injectafer What should I tell my health care provider before I take this medicine? They need to know if you have any of these conditions:  high levels of iron in the blood  liver disease  an unusual or allergic reaction to iron, other medicines, foods, dyes, or preservatives  pregnant or trying to get pregnant  breast-feeding How should I use this medicine? This medicine is  for infusion into a vein. It is given by a health care professional in a hospital or clinic setting. Talk to your pediatrician regarding the use of this medicine in children. Special care may be needed. Overdosage: If you think you have taken too much of this medicine contact a poison control center or emergency room at once. NOTE: This medicine is only for you. Do not share this medicine with others. What if I miss a dose? Keep appointments for follow-up doses. It is important not to miss your dose. Call your care team if you are unable to keep an appointment. What may interact with this medicine? Do not take this medicine with any of the following medications:  deferoxamine  dimercaprol  other iron products This list may not describe all possible interactions. Give your health care provider a list of all the medicines, herbs, non-prescription drugs, or dietary supplements you use. Also tell them if you smoke, drink alcohol, or use illegal drugs. Some items may interact with your medicine. What should I watch for while using this medicine? Visit your doctor or health care professional regularly. Tell your doctor if your symptoms do not start to get better or if they get worse. You may need blood work done while you are taking this medicine. You may need to follow a special diet. Talk to your doctor. Foods that contain iron include: whole grains/cereals, dried fruits, beans, or peas, leafy green vegetables, and organ meats (liver, kidney). What side effects may I notice from receiving this medicine? Side effects that you should report to your doctor or health care professional as soon as possible:  allergic reactions like skin rash, itching or hives, swelling of the face, lips, or tongue  dizziness  facial flushing Side effects that usually do not require medical attention (report to your doctor or health care professional if they continue or are bothersome):  changes in  taste  constipation  headache  nausea, vomiting  pain, redness, or irritation at site where injected This list may not describe all possible side effects. Call your doctor for medical advice about side effects. You may report side effects to FDA at 1-800-FDA-1088. Where should I keep my medicine? This drug is given in a hospital or clinic and will not be stored at home. NOTE: This sheet is a summary. It may not cover all possible information. If you have questions about this medicine, talk to your doctor, pharmacist, or health care provider.  2021 Elsevier/Gold Standard (2020-05-25 14:00:47)

## 2020-08-24 ENCOUNTER — Other Ambulatory Visit: Payer: Self-pay | Admitting: Internal Medicine

## 2020-08-25 LAB — CERULOPLASMIN: Ceruloplasmin: 25 mg/dL (ref 18–36)

## 2020-08-25 LAB — HEPATITIS B CORE ANTIBODY, TOTAL: Hep B Core Total Ab: NONREACTIVE

## 2020-08-25 LAB — ANA: Anti Nuclear Antibody (ANA): POSITIVE — AB

## 2020-08-25 LAB — HEPATITIS B SURFACE ANTIGEN: Hepatitis B Surface Ag: NONREACTIVE

## 2020-08-25 LAB — HEPATITIS C ANTIBODY
Hepatitis C Ab: NONREACTIVE
SIGNAL TO CUT-OFF: 0.02 (ref <1.00)

## 2020-08-25 LAB — ALPHA-1-ANTITRYPSIN: A-1 Antitrypsin, Ser: 131 mg/dL (ref 83–199)

## 2020-08-25 LAB — MITOCHONDRIAL ANTIBODIES: Mitochondrial M2 Ab, IgG: <=20 U

## 2020-08-25 LAB — HEPATITIS A ANTIBODY, TOTAL: Hepatitis A AB,Total: NONREACTIVE

## 2020-08-25 LAB — ANTI-NUCLEAR AB-TITER (ANA TITER): ANA Titer 1: 1:40 {titer} — ABNORMAL HIGH

## 2020-08-25 LAB — ANTI-SMOOTH MUSCLE ANTIBODY, IGG: Actin (Smooth Muscle) Antibody (IGG): <20 U (ref <20)

## 2020-08-25 LAB — IGG: IgG (Immunoglobin G), Serum: 1039 mg/dL (ref 600–1640)

## 2020-08-25 LAB — HEPATITIS B SURFACE ANTIBODY,QUALITATIVE: Hep B S Ab: NONREACTIVE

## 2020-08-26 ENCOUNTER — Other Ambulatory Visit: Payer: Self-pay | Admitting: Nurse Practitioner

## 2020-08-26 DIAGNOSIS — R748 Abnormal levels of other serum enzymes: Secondary | ICD-10-CM

## 2020-08-27 ENCOUNTER — Ambulatory Visit (HOSPITAL_COMMUNITY): Payer: Medicaid Other

## 2020-08-28 ENCOUNTER — Other Ambulatory Visit: Payer: Self-pay | Admitting: Internal Medicine

## 2020-08-31 ENCOUNTER — Ambulatory Visit (HOSPITAL_COMMUNITY): Admission: RE | Admit: 2020-08-31 | Payer: Medicaid Other | Source: Ambulatory Visit

## 2020-08-31 ENCOUNTER — Other Ambulatory Visit: Payer: Self-pay | Admitting: Nurse Practitioner

## 2020-08-31 DIAGNOSIS — R748 Abnormal levels of other serum enzymes: Secondary | ICD-10-CM

## 2020-09-01 ENCOUNTER — Telehealth: Payer: Self-pay | Admitting: Nurse Practitioner

## 2020-09-01 NOTE — Telephone Encounter (Signed)
Patient was advised to call and reschedule his his appointment. The phone number was provided to him

## 2020-09-07 ENCOUNTER — Other Ambulatory Visit: Payer: Self-pay | Admitting: Internal Medicine

## 2020-09-14 ENCOUNTER — Other Ambulatory Visit (INDEPENDENT_AMBULATORY_CARE_PROVIDER_SITE_OTHER): Payer: Medicaid Other

## 2020-09-14 DIAGNOSIS — R748 Abnormal levels of other serum enzymes: Secondary | ICD-10-CM

## 2020-09-14 LAB — HEPATIC FUNCTION PANEL
ALT: 68 U/L — ABNORMAL HIGH (ref 0–53)
AST: 26 U/L (ref 0–37)
Albumin: 4.3 g/dL (ref 3.5–5.2)
Alkaline Phosphatase: 55 U/L (ref 39–117)
Bilirubin, Direct: 0.1 mg/dL (ref 0.0–0.3)
Total Bilirubin: 0.4 mg/dL (ref 0.2–1.2)
Total Protein: 6.7 g/dL (ref 6.0–8.3)

## 2020-10-28 ENCOUNTER — Encounter: Payer: Self-pay | Admitting: Internal Medicine

## 2020-10-28 ENCOUNTER — Ambulatory Visit: Payer: Medicaid Other | Admitting: Internal Medicine

## 2020-10-28 VITALS — BP 118/68 | HR 64 | Ht 68.0 in | Wt 187.0 lb

## 2020-10-28 DIAGNOSIS — K222 Esophageal obstruction: Secondary | ICD-10-CM

## 2020-10-28 DIAGNOSIS — K220 Achalasia of cardia: Secondary | ICD-10-CM | POA: Diagnosis not present

## 2020-10-28 DIAGNOSIS — K3184 Gastroparesis: Secondary | ICD-10-CM

## 2020-10-28 DIAGNOSIS — K769 Liver disease, unspecified: Secondary | ICD-10-CM | POA: Diagnosis not present

## 2020-10-28 DIAGNOSIS — K219 Gastro-esophageal reflux disease without esophagitis: Secondary | ICD-10-CM

## 2020-10-28 NOTE — Patient Instructions (Addendum)
You have been scheduled for an abdominal ultrasound at [facility] (1st floor of hospital) on 11/03/20 at 8:00am. Please arrive 15 minutes prior to your appointment for registration. Make certain not to have anything to eat or drink 6 hours prior to your appointment. Should you need to reschedule your appointment, please contact radiology at [phone]. This test typically takes about 30 minutes to perform.  Due to recent changes in healthcare laws, you may see the results of your imaging and laboratory studies on MyChart before your provider has had a chance to review them.  We understand that in some cases there may be results that are confusing or concerning to you. Not all laboratory results come back in the same time frame and the provider may be waiting for multiple results in order to interpret others.  Please give [place] 48 hours in order for your provider to thoroughly review all the results before contacting the office for clarification of your results.    You should be able to find a medical wedge pillow at a medical supply store ([employer], [employer] are examples) or [employer].  Get one and use that.  Will contact you with ultrasound results and plans.  You will need a follow-up in  6 months. Office will call with an appointment.   Glad to hear you are feeling better.  I appreciate the opportunity to care for you. Gatha Mayer, MD, [facility]

## 2020-10-28 NOTE — Progress Notes (Signed)
[name] 35 y.o. [birth year] [id number]  Assessment & Plan:   Encounter Diagnoses  Name Primary?  . ACHALASIA Yes  . GERD with stricture, status post Heller myotomy   . Gastroparesis, suspected, question vagal nerve injury   . Liver lesion, right lobe     He will have his ultrasound.  I will determine follow-up after that.  Continue current medications and treatment.  We will likely need to see me in 6 months.  I suspect this is a benign liver lesion.  It could lead to other imaging but I think an ultrasound makes sense at this point.  I appreciate the opportunity to care for this patient. CC: Group, [facility]     Subjective:   Chief Complaint: Follow-up of achalasia, gastroparesis iron deficiency and liver lesion  HPI [name] is here for follow-up reporting that he is overall doing much better after he discovered a plan to 5 he is to use to help his stomach.  He was in a couple of months ago having a lot of bloating.  He discovered through some family using licorice root, gingerroot, chamomile, lemon balm and milk thistle tea twice a day he has much less of a problem with bloating and regurgitation.  He was supposed to have had an ultrasound performed after he saw [name] Best in our office in February because in 2020 he had an ultrasound at [facility] showing a right liver lobe lesion thought to possibly be an hemangioma.  He is not sure why he did not follow-up with this ultrasound but is willing to do so.  He is now seeing hematology for iron treatment and that is going well. No Known Allergies Current Meds  Medication Sig  . cyclobenzaprine (FLEXERIL) 10 MG tablet Take 10 mg by mouth 3 (three) times daily as needed.  Marland Kitchen FLUoxetine (PROZAC) 40 MG capsule Take 40 mg by mouth every morning.  . fluPHENAZine (PROLIXIN) 10 MG tablet Take 10 mg by mouth daily.  . iron polysaccharides (NIFEREX) 150 MG capsule TAKE 1 CAPSULE BY MOUTH TWICE A DAY  .  meloxicam (MOBIC) 15 MG tablet Take 15 mg by mouth daily.  . metoCLOPramide (REGLAN) 10 MG tablet TAKE 1 TABLET BY MOUTH 3 TIMES DAILY BEFORE MEALS  . omeprazole (PRILOSEC) 40 MG capsule TAKE 1 CAPSULE BY MOUTH TWICE A DAY BEFORE BREAKFAST AND BEFORE DINNER  . oxcarbazepine (TRILEPTAL) 600 MG tablet Take 600 mg by mouth 2 (two) times daily.  Marland Kitchen PRESCRIPTION MEDICATION ? Name of med for neck pain-  PRN  . QUEtiapine (SEROQUEL) 300 MG tablet Take 600 mg by mouth at bedtime.  . [DISCONTINUED] FLUoxetine (PROZAC) 20 MG capsule Take 20 mg by mouth daily.   Past Medical History:  Diagnosis Date  . Achalasia of esophagus   . Anemia   . Blood transfusion without reported diagnosis   . Depression   . Gastroparesis, suspected, question vagal nerve injury 03/25/2019  . GERD with stricture   . Schizoaffective disorder   . Seizures ([place])    Last seizure 4-5 yrs ago - caused by Wellbutrin    Past Surgical History:  Procedure Laterality Date  . BALLOON DILATION  02/12/2012   Procedure: BALLOON DILATION;  Surgeon: Alexander Mayer, MD;  Location: [facility];  Service: Endoscopy;  Laterality: N/A;  . COLONOSCOPY N/A 10/06/2015   Procedure: COLONOSCOPY;  Surgeon: Alexander Gunning, MD;  Location: [facility];  Service: Gastroenterology;  Laterality: N/A;  . COLONOSCOPY    .  ESOPHAGOGASTRODUODENOSCOPY N/A 10/06/2015   Procedure: ESOPHAGOGASTRODUODENOSCOPY (EGD);  Surgeon: Alexander Gunning, MD;  Location: [facility];  Service: Gastroenterology;  Laterality: N/A;  . ESOPHAGOGASTRODUODENOSCOPY (EGD) WITH ESOPHAGEAL DILATION  08/22/12  . esophagram  08/31/2006  . HELLER MYOTOMY  02/2007 AND 11/2015   with Dor Fundoplication- Dr. Garner [name]) twice  . PANENDOSCOPY  11/08/2006   with submucosal injection  . PANENDOSCOPY  03/09/2006   normal  . UPPER GASTROINTESTINAL ENDOSCOPY     Social History   Social History Narrative   Single   Lives w/ mom   Employed at [employer]   family  history includes Diabetes in his father.   Review of Systems As above  Objective:   Physical Exam BP 118/68   Pulse 64   Ht 5\' 8"  (1.727 m)   Wt 187 lb (84.8 kg)   BMI 28.43 kg/m

## 2020-11-03 ENCOUNTER — Ambulatory Visit (HOSPITAL_COMMUNITY)
Admission: RE | Admit: 2020-11-03 | Discharge: 2020-11-03 | Disposition: A | Discharge status: Home / Self Care | Payer: Medicaid Other | Source: Ambulatory Visit | Attending: Nurse Practitioner | Admitting: Nurse Practitioner

## 2020-11-03 ENCOUNTER — Other Ambulatory Visit: Payer: Self-pay

## 2020-11-03 DIAGNOSIS — K769 Liver disease, unspecified: Secondary | ICD-10-CM | POA: Insufficient documentation

## 2020-11-03 DIAGNOSIS — R140 Abdominal distension (gaseous): Secondary | ICD-10-CM | POA: Insufficient documentation

## 2020-11-04 NOTE — Progress Notes (Signed)
HEMATOLOGY/ONCOLOGY CONSULTATION NOTE  Date of Service: 11/05/2020  Patient Care Team: [facility] [place] as PCP - General  CHIEF COMPLAINTS/PURPOSE OF CONSULTATION:  IDA  HISTORY OF PRESENTING ILLNESS:   [name] is a wonderful 35 y.o. male who has been referred to [place] by Dr. Carlean Purl, MD  for evaluation and management of iron deficiency anemia. The pt reports that he is doing well overall.  The pt reports that he was referred by his GI for anemia over the last few years. The pt's iron levels have been lower over the last three years. The pt reports that he has gained weight since his last endoscopy, eating a more balanced diet. The pt reports that he does not feel weak or tired. The pt has returned to his baseline of 186 pounds from being as low as 140-150 pounds. The pt reports that he experiences constipation and hard stools, and his bowel movements are only regular after drinking this tea that helps him have normal stools. The pt notes the feeling and appearance of bloating in his stomach. He notes that he feels the food stays in his stomach longer after eating, feeling very gaseous. The pt does take daily iron pills. The pt notes he does take the Reglan TID before his meals.   The pt notes that he currently eats a yogurt at 5 am before work, lunch at 4 pm, and snacks throughout day if needed.  The pt denies any history of allergies to medications.  On review of systems, pt reports abdominal bloating, constipation and denies fatigue, bloody/black stools, abdominal pain, back pain, fevers, chills, night sweats, flank pain, problems passing urine, and any other symptoms.  INTERVAL HISTORY:  [name] is a wonderful 35 y.o. male who is here today for f/u regarding evaluation and management of iron deficiency anemia. The patient's last visit with [place] was on 08/09/2020. The pt reports that he is doing well overall.  The pt reports that he felt much better after  receiving the two doses of IV iron in February. He notes no issues tolerating this. The pt notes his current energy levels are baseline with no fatigue.The pt continues to take a multivitamin daily. The pt notes he is no longer constipated after stopping the oral iron.  On review of systems, pt denies bleeding issues, constipation, fatigue, acid reflux, stomach issues, abdominal pain, changes in bowel habits, sudden weight loss, swallowing issues, decreased food intake, and any other symptoms.   MEDICAL HISTORY:  Past Medical History:  Diagnosis Date  . Achalasia of esophagus   . Anemia   . Blood transfusion without reported diagnosis   . Depression   . Gastroparesis, suspected, question vagal nerve injury 03/25/2019  . GERD with stricture   . Schizoaffective disorder   . Seizures ([place])    Last seizure 4-5 yrs ago - caused by Wellbutrin     SURGICAL HISTORY: Past Surgical History:  Procedure Laterality Date  . BALLOON DILATION  02/12/2012   Procedure: BALLOON DILATION;  Surgeon: Gatha Mayer, MD;  Location: [facility];  Service: Endoscopy;  Laterality: N/A;  . COLONOSCOPY N/A 10/06/2015   Procedure: COLONOSCOPY;  Surgeon: Manus Gunning, MD;  Location: [facility];  Service: Gastroenterology;  Laterality: N/A;  . COLONOSCOPY    . ESOPHAGOGASTRODUODENOSCOPY N/A 10/06/2015   Procedure: ESOPHAGOGASTRODUODENOSCOPY (EGD);  Surgeon: Manus Gunning, MD;  Location: [facility];  Service: Gastroenterology;  Laterality: N/A;  . ESOPHAGOGASTRODUODENOSCOPY (EGD) WITH ESOPHAGEAL DILATION  08/22/12  .  esophagram  08/31/2006  . HELLER MYOTOMY  02/2007 AND 11/2015   with Dor Fundoplication- Dr. Garner [facility]) twice  . PANENDOSCOPY  11/08/2006   with submucosal injection  . PANENDOSCOPY  03/09/2006   normal  . UPPER GASTROINTESTINAL ENDOSCOPY      SOCIAL HISTORY: Social History   Socioeconomic History  . Marital status: Single    Spouse name: Not on file  . Number of  children: 0  . Years of education: Not on file  . Highest education level: Not on file  Occupational History  . Occupation: [employer]  . Occupation: [employer] [place] [address]  Tobacco Use  . Smoking status: Former Smoker    Packs/day: 0.50    Years: 2.00    Pack years: 1.00    Types: E-cigarettes  . Smokeless tobacco: Never Used  . Tobacco comment: given counseling sheet 01-26-12, pt started e-sig  Vaping Use  . Vaping Use: Every day  . Start date: 10/24/2013  . Substances: Nicotine  Substance and Sexual Activity  . Alcohol use: No  . Drug use: No  . Sexual activity: Not on file  Other Topics Concern  . Not on file  Social History Narrative   Single   Lives w/ mom   Employed at [employer] Determinants of Health   Financial Resource Strain: Not on file  Food Insecurity: Not on file  Transportation Needs: Not on file  Physical Activity: Not on file  Stress: Not on file  Social Connections: Not on file  Intimate Partner Violence: Not on file    FAMILY HISTORY: Family History  Problem Relation Age of Onset  . Diabetes Father   . Colon cancer Neg Hx   . Stomach cancer Neg Hx   . Pancreatic cancer Neg Hx   . Rectal cancer Neg Hx   . Heart disease Neg Hx   . Colon polyps Neg Hx     ALLERGIES:  has No Known Allergies.  MEDICATIONS:  Current Outpatient Medications  Medication Sig Dispense Refill  . cyclobenzaprine (FLEXERIL) 10 MG tablet Take 10 mg by mouth 3 (three) times daily as needed.    Marland Kitchen FLUoxetine (PROZAC) 40 MG capsule Take 40 mg by mouth every morning.    . fluPHENAZine (PROLIXIN) 10 MG tablet Take 10 mg by mouth daily.    . iron polysaccharides (NIFEREX) 150 MG capsule TAKE 1 CAPSULE BY MOUTH TWICE A DAY 180 capsule 1  . meloxicam (MOBIC) 15 MG tablet Take 15 mg by mouth daily.    . metoCLOPramide (REGLAN) 10 MG tablet TAKE 1 TABLET BY MOUTH 3 TIMES DAILY BEFORE MEALS 90 tablet 1  . omeprazole (PRILOSEC) 40 MG capsule TAKE 1 CAPSULE BY MOUTH TWICE  A DAY BEFORE BREAKFAST AND BEFORE DINNER 60 capsule 1  . oxcarbazepine (TRILEPTAL) 600 MG tablet Take 600 mg by mouth 2 (two) times daily.    Marland Kitchen PRESCRIPTION MEDICATION ? Name of med for neck pain-  PRN    . QUEtiapine (SEROQUEL) 300 MG tablet Take 600 mg by mouth at bedtime.  1   No current facility-administered medications for this visit.    REVIEW OF SYSTEMS:   10 Point review of Systems was done is negative except as noted above.  PHYSICAL EXAMINATION: ECOG PERFORMANCE STATUS: 0 - Asymptomatic  . Vitals:   11/05/20 1220  BP: 125/82  Pulse: 98  Resp: 20  Temp: (!) 97.4 F (36.3 C)  SpO2: 100%   Filed Weights   11/05/20  1220  Weight: 184 lb 9.6 oz (83.7 kg)   .Body mass index is 28.07 kg/m.   GENERAL:alert, in no acute distress and comfortable SKIN: no acute rashes, no significant lesions EYES: conjunctiva are pink and non-injected, sclera anicteric OROPHARYNX: MMM, no exudates, no oropharyngeal erythema or ulceration NECK: supple, no JVD LYMPH:  no palpable lymphadenopathy in the cervical, axillary or inguinal regions LUNGS: clear to auscultation b/l with normal respiratory effort HEART: regular rate & rhythm ABDOMEN:  normoactive bowel sounds , non tender, not distended. Extremity: no pedal edema PSYCH: alert & oriented x 3 with fluent speech NEURO: no focal motor/sensory deficits  LABORATORY DATA:  I have reviewed the data as listed  . CBC Latest Ref Rng & Units 11/05/2020 07/27/2020 06/10/2020  WBC 4.0 - 10.5 K/uL 6.1 4.1 3.9(L)  Hemoglobin 13.0 - 17.0 g/dL 12.5(L) 13.0 12.9(L)  Hematocrit 39.0 - 52.0 % 37.3(L) 38.5(L) 39.1  Platelets 150 - 400 K/uL 322 312.0 344.0    . CMP Latest Ref Rng & Units 11/05/2020 09/14/2020 08/23/2020  Glucose 70 - 99 mg/dL 98 - -  BUN 6 - 20 mg/dL 10 - -  Creatinine 0.61 - 1.24 mg/dL 0.99 - -  Sodium 135 - 145 mmol/L 140 - -  Potassium 3.5 - 5.1 mmol/L 3.8 - -  Chloride 98 - 111 mmol/L 104 - -  CO2 22 - 32 mmol/L 28 - -   Calcium 8.9 - 10.3 mg/dL 9.5 - -  Total Protein 6.5 - 8.1 g/dL 6.9 6.7 6.6  Total Bilirubin 0.3 - 1.2 mg/dL 0.4 0.4 0.2  Alkaline Phos 38 - 126 U/L 63 55 47  AST 15 - 41 U/L 23 26 167(H)  ALT 0 - 44 U/L 35 68(H) 324(H)   . Lab Results  Component Value Date   IRON 77 11/05/2020   TIBC 343 11/05/2020   IRONPCTSAT 23 11/05/2020   (Iron and TIBC)  Lab Results  Component Value Date   FERRITIN 44 11/05/2020      RADIOGRAPHIC STUDIES: I have personally reviewed the radiological images as listed and agreed with the findings in the report. No results found.  ASSESSMENT & PLAN:   35 yo with   1) Iron deficiency anemia  Likely due to chronic GI losses.  2) multiple GI issues including achalasia, GERD with stricture, gastroparesis. PLAN: -Patient is feeling much better after his 2 IV iron infusions. -Ferritin is 44 and iron saturation is 23%.  Hemoglobin within normal limits at 12.5.  No indication for additional IV iron at this time. -Continue follow-up with primary care physician. -Iron rich diet and if tolerated consider p.o. iron replacement with primary care physician. -Continue daily Multivitamin, Vitamin D, Omega-3.  -Will see back as needed.    FOLLOW UP: Labs today RTC w Dr Irene Limbo as needed    All of the patients questions were answered with apparent satisfaction. The patient knows to call the clinic with any problems, questions or concerns.  The total time spent in the appointment was 15 minutes and more than 50% was on counseling and direct patient cares.     Sullivan Lone MD [place] AAHIVMS [facility] Hematology/Oncology Physician [facility]  (Office):       [phone] (Work cell):  [phone] (Fax):           [phone]  11/05/2020 12:51 PM  I, Reinaldo Raddle, am acting as scribe for Dr. Sullivan Lone, MD.  .I have reviewed the above documentation for accuracy and  completeness, and I agree with the above. Brunetta Genera MD

## 2020-11-05 ENCOUNTER — Inpatient Hospital Stay: Payer: Medicaid Other

## 2020-11-05 ENCOUNTER — Inpatient Hospital Stay: Payer: Medicaid Other | Attending: Hematology | Admitting: Hematology

## 2020-11-05 ENCOUNTER — Telehealth: Payer: Self-pay | Admitting: Hematology

## 2020-11-05 ENCOUNTER — Other Ambulatory Visit: Payer: Self-pay

## 2020-11-05 VITALS — BP 125/82 | HR 98 | Temp 97.4°F | Resp 20 | Wt 184.6 lb

## 2020-11-05 DIAGNOSIS — Z79899 Other long term (current) drug therapy: Secondary | ICD-10-CM | POA: Insufficient documentation

## 2020-11-05 DIAGNOSIS — K220 Achalasia of cardia: Secondary | ICD-10-CM | POA: Insufficient documentation

## 2020-11-05 DIAGNOSIS — D500 Iron deficiency anemia secondary to blood loss (chronic): Secondary | ICD-10-CM | POA: Diagnosis not present

## 2020-11-05 DIAGNOSIS — D509 Iron deficiency anemia, unspecified: Secondary | ICD-10-CM | POA: Diagnosis present

## 2020-11-05 DIAGNOSIS — Z833 Family history of diabetes mellitus: Secondary | ICD-10-CM | POA: Insufficient documentation

## 2020-11-05 DIAGNOSIS — R140 Abdominal distension (gaseous): Secondary | ICD-10-CM | POA: Insufficient documentation

## 2020-11-05 DIAGNOSIS — K3184 Gastroparesis: Secondary | ICD-10-CM | POA: Diagnosis not present

## 2020-11-05 DIAGNOSIS — K5900 Constipation, unspecified: Secondary | ICD-10-CM | POA: Diagnosis not present

## 2020-11-05 DIAGNOSIS — Z87891 Personal history of nicotine dependence: Secondary | ICD-10-CM | POA: Insufficient documentation

## 2020-11-05 LAB — CBC WITH DIFFERENTIAL/PLATELET
Abs Immature Granulocytes: 0.02 10*3/uL (ref 0.00–0.07)
Basophils Absolute: 0 10*3/uL (ref 0.0–0.1)
Basophils Relative: 0 %
Eosinophils Absolute: 0.1 10*3/uL (ref 0.0–0.5)
Eosinophils Relative: 2 %
HCT: 37.3 % — ABNORMAL LOW (ref 39.0–52.0)
Hemoglobin: 12.5 g/dL — ABNORMAL LOW (ref 13.0–17.0)
Immature Granulocytes: 0 %
Lymphocytes Relative: 19 %
Lymphs Abs: 1.2 10*3/uL (ref 0.7–4.0)
MCH: 28.9 pg (ref 26.0–34.0)
MCHC: 33.5 g/dL (ref 30.0–36.0)
MCV: 86.1 fL (ref 80.0–100.0)
Monocytes Absolute: 0.5 10*3/uL (ref 0.1–1.0)
Monocytes Relative: 9 %
Neutro Abs: 4.3 10*3/uL (ref 1.7–7.7)
Neutrophils Relative %: 70 %
Platelets: 322 10*3/uL (ref 150–400)
RBC: 4.33 MIL/uL (ref 4.22–5.81)
RDW: 14 % (ref 11.5–15.5)
WBC: 6.1 10*3/uL (ref 4.0–10.5)
nRBC: 0 % (ref 0.0–0.2)

## 2020-11-05 LAB — IRON AND TIBC
Iron: 77 ug/dL (ref 42–163)
Saturation Ratios: 23 % (ref 20–55)
TIBC: 343 ug/dL (ref 202–409)
UIBC: 265 ug/dL (ref 117–376)

## 2020-11-05 LAB — CMP (CANCER CENTER ONLY)
ALT: 35 U/L (ref 0–44)
AST: 23 U/L (ref 15–41)
Albumin: 4 g/dL (ref 3.5–5.0)
Alkaline Phosphatase: 63 U/L (ref 38–126)
Anion gap: 8 (ref 5–15)
BUN: 10 mg/dL (ref 6–20)
CO2: 28 mmol/L (ref 22–32)
Calcium: 9.5 mg/dL (ref 8.9–10.3)
Chloride: 104 mmol/L (ref 98–111)
Creatinine: 0.99 mg/dL (ref 0.61–1.24)
GFR, Estimated: >60 mL/min (ref >60)
Glucose, Bld: 98 mg/dL (ref 70–99)
Potassium: 3.8 mmol/L (ref 3.5–5.1)
Sodium: 140 mmol/L (ref 135–145)
Total Bilirubin: 0.4 mg/dL (ref 0.3–1.2)
Total Protein: 6.9 g/dL (ref 6.5–8.1)

## 2020-11-05 LAB — FERRITIN: Ferritin: 44 ng/mL (ref 24–336)

## 2020-11-05 NOTE — Telephone Encounter (Signed)
Scheduled follow-up appointment per 5/13 los. Patient is aware. 

## 2020-11-12 ENCOUNTER — Telehealth: Payer: Self-pay | Admitting: Internal Medicine

## 2020-11-12 ENCOUNTER — Telehealth: Payer: Self-pay | Admitting: *Deleted

## 2020-11-12 NOTE — Telephone Encounter (Signed)
-----   Message from Brunetta Genera, MD sent at 11/11/2020  4:48 PM EDT ----- Please let patient know he is not anemic and his hemoglobin is 12.5.  Ferritin is 44 and iron saturation is 23% which suggests against any significant iron deficiency at this time.  No indication for additional IV iron.  Continue follow-up with primary care physician for continued blood count monitoring and monitoring iron levels.

## 2020-11-12 NOTE — Telephone Encounter (Signed)
Contacted patient regarding test results as directed by Dr. Irene Limbo. Information given to patient. Patient encouraged to contact PCP for monitoring and further po iron recommendations. Patient verbalized understanding.

## 2020-11-12 NOTE — Telephone Encounter (Signed)
I called and spoke with Legrand Como and told him that Dr Irene Limbo wants him to discuss this with his PCP office.

## 2020-11-12 NOTE — Telephone Encounter (Signed)
Inbound call from patient. States he need a refill for Iron polysaccharides for his low iron. Says he had stopped taking them because they cause constipation. But when got blood transfusion was told he needed to take them. Could he get a refill? States he threw then in the trash once he had stopped.

## 2020-11-15 ENCOUNTER — Other Ambulatory Visit: Payer: Self-pay | Admitting: Internal Medicine

## 2020-11-16 MED ORDER — METOCLOPRAMIDE HCL 10 MG PO TABS: ORAL_TABLET | ORAL | 5 refills | Status: DC

## 2020-11-16 NOTE — Telephone Encounter (Signed)
[name] informed that I sent in his reglan to the [facility] as requested and approved.

## 2020-11-16 NOTE — Telephone Encounter (Signed)
Yes x 6

## 2020-11-16 NOTE — Telephone Encounter (Signed)
May I refill his reglan Sir?

## 2020-11-16 NOTE — Telephone Encounter (Signed)
Patient called in. Update pharmacy to [facility]. Ph [phone]

## 2020-11-16 NOTE — Telephone Encounter (Signed)
Patient called again is requesting a RF on the Reglan

## 2020-11-16 NOTE — Addendum Note (Signed)
Addended by: [place], Janellie Tennison E on: 11/16/2020 04:41 PM   Modules accepted: Orders

## 2020-11-26 ENCOUNTER — Other Ambulatory Visit: Payer: Self-pay | Admitting: Internal Medicine

## 2020-12-02 ENCOUNTER — Other Ambulatory Visit: Payer: Self-pay

## 2020-12-02 ENCOUNTER — Other Ambulatory Visit: Payer: Medicaid Other

## 2020-12-02 DIAGNOSIS — K769 Liver disease, unspecified: Secondary | ICD-10-CM

## 2020-12-03 LAB — AFP TUMOR MARKER: AFP-Tumor Marker: 7.4 ng/mL — ABNORMAL HIGH (ref <6.1)

## 2020-12-21 ENCOUNTER — Other Ambulatory Visit: Payer: Self-pay

## 2020-12-21 ENCOUNTER — Ambulatory Visit
Admission: RE | Admit: 2020-12-21 | Discharge: 2020-12-21 | Disposition: A | Discharge status: Home / Self Care | Payer: Medicaid Other | Source: Ambulatory Visit | Attending: Nurse Practitioner | Admitting: Nurse Practitioner

## 2020-12-21 ENCOUNTER — Encounter: Payer: Self-pay | Admitting: Hematology

## 2020-12-21 DIAGNOSIS — K769 Liver disease, unspecified: Secondary | ICD-10-CM

## 2020-12-21 MED ORDER — GADOBENATE DIMEGLUMINE 529 MG/ML IV SOLN
17.0000 mL | Freq: Once | INTRAVENOUS | Status: AC | PRN
  Administered 2020-12-21: 17 mL via INTRAVENOUS

## 2020-12-24 ENCOUNTER — Telehealth: Payer: Self-pay

## 2020-12-24 NOTE — Telephone Encounter (Signed)
-----   Message from Noralyn Pick, NP sent at 12/24/2020  8:19 AM EDT ----- Lenna Sciara, pls inform the patient his abdominal MRI showed fatty liver, no evidence of any liver lesion which is good news. Normal LFTs. Avoid weight gain, eat a healthy diet, weight loss recommended o reduce risk of fatty liver disease. Thx   Dr. Lorelei Pont

## 2020-12-28 ENCOUNTER — Telehealth: Payer: Self-pay | Admitting: Nurse Practitioner

## 2020-12-28 NOTE — Telephone Encounter (Signed)
Inbound call from patient requesting recommendations for PCP

## 2020-12-28 NOTE — Telephone Encounter (Signed)
LM for patient

## 2020-12-29 NOTE — Telephone Encounter (Signed)
Gave the patient the contact information for [place] at [employer]. This located about 5 miles from his address.

## 2020-12-29 NOTE — Telephone Encounter (Signed)
Notified patient of result note , patient expressed understanding and agreement, no further questions at this time.

## 2021-01-27 ENCOUNTER — Other Ambulatory Visit: Payer: Self-pay | Admitting: Internal Medicine

## 2021-01-27 ENCOUNTER — Telehealth: Payer: Self-pay | Admitting: Internal Medicine

## 2021-01-27 NOTE — Telephone Encounter (Signed)
This has already been taken care of. I called and told him.

## 2021-01-27 NOTE — Telephone Encounter (Signed)
Inbound call from patient requesting medication refill for omeprazole.

## 2021-03-16 ENCOUNTER — Other Ambulatory Visit: Payer: Medicaid Other

## 2021-03-16 ENCOUNTER — Other Ambulatory Visit: Payer: Self-pay

## 2021-03-16 ENCOUNTER — Telehealth: Payer: Self-pay

## 2021-03-16 DIAGNOSIS — K769 Liver disease, unspecified: Secondary | ICD-10-CM

## 2021-03-16 NOTE — Telephone Encounter (Signed)
Called pt and informed it is time for repeat labs. Advised he may proceed to basement level of our office to complete. Also appears from Colleen's recommendations (MRI dated 12/21/20) pt is due for follow up with Dr. Carlean Purl regarding achalasia in September. Pt verbalized acceptance and understanding of all recommendations. Pt confirmed he will stop by to have his labs drawn within the next few days. In addition, Dr. Carlean Purl did not have any availabilities until November. Pt has been scheduled as follows:  Appointment Information  Name: [name], [name] MRN: [id number]  Date: 04/26/2021 Status: Sch  Time: 3:10 PM Length: 20  Visit Type: FOLLOW UP 20 [336] Copay: $0.00  Provider: Gatha Mayer, MD Department: [facility]  Referring Provider: [facility] CSN: [id number]  Notes: f/u achalasia/ae  Made On: 03/16/2021 9:29 AM By: Hardie Pulley, Lougenia Morrissey J

## 2021-03-16 NOTE — Telephone Encounter (Signed)
-----   Message from Greggory Keen, LPN sent at 11/17/8183 11:48 AM EDT ----- Remind the patient to go to the lab for repeat AFP. Order placed for this on 12/13/20

## 2021-03-17 LAB — AFP TUMOR MARKER: AFP-Tumor Marker: 6.6 ng/mL — ABNORMAL HIGH (ref <6.1)

## 2021-03-21 NOTE — Telephone Encounter (Signed)
Lab completed. Closing encounter

## 2021-03-28 ENCOUNTER — Other Ambulatory Visit: Payer: Self-pay | Admitting: Internal Medicine

## 2021-04-26 ENCOUNTER — Other Ambulatory Visit: Payer: Self-pay | Admitting: Internal Medicine

## 2021-04-26 ENCOUNTER — Other Ambulatory Visit (INDEPENDENT_AMBULATORY_CARE_PROVIDER_SITE_OTHER): Payer: Medicaid Other

## 2021-04-26 ENCOUNTER — Encounter: Payer: Self-pay | Admitting: Internal Medicine

## 2021-04-26 ENCOUNTER — Ambulatory Visit: Payer: Medicaid Other | Admitting: Internal Medicine

## 2021-04-26 VITALS — BP 112/68 | HR 88 | Ht 68.0 in | Wt 182.5 lb

## 2021-04-26 DIAGNOSIS — D500 Iron deficiency anemia secondary to blood loss (chronic): Secondary | ICD-10-CM

## 2021-04-26 DIAGNOSIS — K219 Gastro-esophageal reflux disease without esophagitis: Secondary | ICD-10-CM

## 2021-04-26 DIAGNOSIS — K220 Achalasia of cardia: Secondary | ICD-10-CM

## 2021-04-26 DIAGNOSIS — K222 Esophageal obstruction: Secondary | ICD-10-CM

## 2021-04-26 DIAGNOSIS — K760 Fatty (change of) liver, not elsewhere classified: Secondary | ICD-10-CM

## 2021-04-26 DIAGNOSIS — K3184 Gastroparesis: Secondary | ICD-10-CM | POA: Diagnosis not present

## 2021-04-26 LAB — CBC WITH DIFFERENTIAL/PLATELET
Basophils Absolute: 0 10*3/uL (ref 0.0–0.1)
Basophils Relative: 0.5 % (ref 0.0–3.0)
Eosinophils Absolute: 0.1 10*3/uL (ref 0.0–0.7)
Eosinophils Relative: 3.3 % (ref 0.0–5.0)
HCT: 29.1 % — ABNORMAL LOW (ref 39.0–52.0)
Hemoglobin: 9.2 g/dL — ABNORMAL LOW (ref 13.0–17.0)
Lymphocytes Relative: 24.7 % (ref 12.0–46.0)
Lymphs Abs: 1 10*3/uL (ref 0.7–4.0)
MCHC: 31.8 g/dL (ref 30.0–36.0)
MCV: 69.1 fl — ABNORMAL LOW (ref 78.0–100.0)
Monocytes Absolute: 0.5 10*3/uL (ref 0.1–1.0)
Monocytes Relative: 11.6 % (ref 3.0–12.0)
Neutro Abs: 2.4 10*3/uL (ref 1.4–7.7)
Neutrophils Relative %: 59.9 % (ref 43.0–77.0)
Platelets: 403 10*3/uL — ABNORMAL HIGH (ref 150.0–400.0)
RBC: 4.2 Mil/uL — ABNORMAL LOW (ref 4.22–5.81)
RDW: 18.5 % — ABNORMAL HIGH (ref 11.5–15.5)
WBC: 4 10*3/uL (ref 4.0–10.5)

## 2021-04-26 LAB — FERRITIN: Ferritin: 3.5 ng/mL — ABNORMAL LOW (ref 22.0–322.0)

## 2021-04-26 NOTE — Patient Instructions (Signed)
Your provider has requested that you go to the basement level for lab work before leaving today. Press "B" on the elevator. The lab is located at the first door on the left as you exit the elevator.  Due to recent changes in healthcare laws, you may see the results of your imaging and laboratory studies on MyChart before your provider has had a chance to review them.  We understand that in some cases there may be results that are confusing or concerning to you. Not all laboratory results come back in the same time frame and the provider may be waiting for multiple results in order to interpret others.  Please give [place] 48 hours in order for your provider to thoroughly review all the results before contacting the office for clarification of your results.   Follow up with [place] in May of 2023, call late March to set up appointment.   I appreciate the opportunity to care for you. Silvano Rusk, MD, [facility]

## 2021-04-26 NOTE — Progress Notes (Signed)
[name] 35 y.o. [birth year] [id number]  Assessment & Plan:   Encounter Diagnoses  Name Primary?   GERD with stricture, status post Heller myotomy Yes   ACHALASIA    Gastroparesis, suspected, question vagal nerve injury    Iron deficiency anemia due to chronic blood loss    Hepatic steatosis with focal sparing simulating liver lesion    Lynx is doing reasonably well given all of his problems.  I have checked labs and these can be seen below.  He needs to go back on a ferrous sulfate supplement daily and he should never come off it in my opinion.  I think he has a combination of chronic blood loss anemia related to his upper GI pathology and a nutritional problem leading to iron deficiency.  At any rate now that he is off his iron supplement his hemoglobin has dropped and his ferritin is low.  He does not seem to have signs of tardive dyskinesia or problems related to his metoclopramide.  I will plan to see him back in 6 months routinely sooner if needed.  We will have him follow-up his CBC and ferritin in 2 months.  We will contact him with the results and plans.   CBC Latest Ref Rng & Units 04/26/2021 11/05/2020 07/27/2020  WBC 4.0 - 10.5 K/uL 4.0 6.1 4.1  Hemoglobin 13.0 - 17.0 g/dL 9.2(L) 12.5(L) 13.0  Hematocrit 39.0 - 52.0 % 29.1(L) 37.3(L) 38.5(L)  Platelets 150.0 - 400.0 K/uL 403.0(H) 322 312.0    Lab Results  Component Value Date   FERRITIN 3.5 (L) 04/26/2021   [facility]   Subjective:   Chief Complaint: Follow-up of achalasia, stricture gastroparesis and anemia  HPI [name] is a 35 year old man with a history of achalasia status post Heller myotomy twice, post myotomy GERD related peptic stricture and suspected gastroparesis who is here for follow-up.  He also has a history of iron deficiency anemia.  He reports that in general things are going fairly well he regurgitates or vomits about 3 times a week which is much better than it has been  over time.  He reports that he is adapting to it and is less frustrated with his problems.  He avoids foods that tend to trigger problems.  He is not having any overt dysphagia and does not think he needs an esophageal dilation.  He continues to use metoclopramide and has not noted any side effects from that.  He reports that his primary care provider said he could stop iron and just take a multivitamin with supplement. Wt Readings from Last 3 Encounters:  04/26/21 182 lb 8 oz (82.8 kg)  11/05/20 184 lb 9.6 oz (83.7 kg)  10/28/20 187 lb (84.8 kg)    He is also had a question about liver lesion on an ultrasound that we worked up with MRI and it turned out to be focal fatty sparing.  He has a slightly elevated alpha-fetoprotein that has not changed on follow-up. No Known Allergies Current Meds  Medication Sig   cyclobenzaprine (FLEXERIL) 10 MG tablet Take 10 mg by mouth 3 (three) times daily as needed.   FLUoxetine (PROZAC) 40 MG capsule Take 40 mg by mouth every morning.   fluPHENAZine (PROLIXIN) 10 MG tablet Take 10 mg by mouth daily.   iron polysaccharides (NIFEREX) 150 MG capsule TAKE 1 CAPSULE BY MOUTH TWICE A DAY   meloxicam (MOBIC) 15 MG tablet Take 15 mg by mouth daily.   metoCLOPramide (REGLAN) 10 MG  tablet TAKE 1 TABLET BY MOUTH 3 TIMES DAILY BEFORE MEALS   omeprazole (PRILOSEC) 40 MG capsule TAKE 1 CAPSULE BY MOUTH TWICE A DAY * BEFORE BREAKFAST & BEFORE DINNER*   oxcarbazepine (TRILEPTAL) 600 MG tablet Take 600 mg by mouth 2 (two) times daily.   PRESCRIPTION MEDICATION ? Name of med for neck pain-  PRN   QUEtiapine (SEROQUEL) 300 MG tablet Take 600 mg by mouth at bedtime.   Past Medical History:  Diagnosis Date   Achalasia of esophagus    Anemia    Blood transfusion without reported diagnosis    Depression    Gastroparesis, suspected, question vagal nerve injury 03/25/2019   GERD with stricture    Schizoaffective disorder    Seizures ([place])    Last seizure 4-5 yrs ago - caused  by Wellbutrin    Past Surgical History:  Procedure Laterality Date   BALLOON DILATION  02/12/2012   Procedure: BALLOON DILATION;  Surgeon: Gatha Mayer, MD;  Location: [facility];  Service: Endoscopy;  Laterality: N/A;   COLONOSCOPY N/A 10/06/2015   Procedure: COLONOSCOPY;  Surgeon: Manus Gunning, MD;  Location: [facility];  Service: Gastroenterology;  Laterality: N/A;   COLONOSCOPY     ESOPHAGOGASTRODUODENOSCOPY N/A 10/06/2015   Procedure: ESOPHAGOGASTRODUODENOSCOPY (EGD);  Surgeon: Manus Gunning, MD;  Location: [facility];  Service: Gastroenterology;  Laterality: N/A;   ESOPHAGOGASTRODUODENOSCOPY (EGD) WITH ESOPHAGEAL DILATION  08/22/12   esophagram  08/31/2006   HELLER MYOTOMY  02/2007 AND 11/2015   with Dor Fundoplication- Dr. Garner [facility]) twice   PANENDOSCOPY  11/08/2006   with submucosal injection   PANENDOSCOPY  03/09/2006   normal   UPPER GASTROINTESTINAL ENDOSCOPY     Social History   Social History Narrative   Single   Lives w/ mom   Employed at [employer]   family history includes Diabetes in his father.   Review of Systems As above  Objective:   Physical Exam BP 112/68   Pulse 88   Ht 5\' 8"  (1.727 m)   Wt 182 lb 8 oz (82.8 kg)   BMI 27.75 kg/m  Lungs cta Cor NL S1S2 no rmg Abd soft and NT, no splash No signs of tar dive dyskinesia

## 2021-04-27 ENCOUNTER — Other Ambulatory Visit: Payer: Self-pay

## 2021-04-27 DIAGNOSIS — D500 Iron deficiency anemia secondary to blood loss (chronic): Secondary | ICD-10-CM

## 2021-04-27 MED ORDER — FERROUS SULFATE 325 (65 FE) MG PO TBEC: 325.0000 mg | DELAYED_RELEASE_TABLET | Freq: Every day | ORAL | 3 refills | Status: DC

## 2021-05-23 ENCOUNTER — Other Ambulatory Visit: Payer: Self-pay | Admitting: Internal Medicine

## 2021-05-25 ENCOUNTER — Other Ambulatory Visit: Payer: Self-pay | Admitting: Internal Medicine

## 2021-05-25 NOTE — Telephone Encounter (Signed)
I called and told [place] it has been done.

## 2021-05-25 NOTE — Telephone Encounter (Signed)
Patient called states the pharmacy has been sending the office request to refill Omeprazole but they have not received any response. Requested a call back so he knows it has been done.

## 2021-06-08 ENCOUNTER — Telehealth: Payer: Self-pay | Admitting: Internal Medicine

## 2021-06-08 NOTE — Telephone Encounter (Signed)
Pt state that two weeks after he started taking his Iron Pill that he started to feel really tired. Pt was notified that this Medication is to increase energy and take away the fatigue. Pt was questioned has he tested for Covid. Pt stated no. Pt was encouraged to reach out to his PCP in regard to the recent tiredness and could also test for Covid. Pt verbalized understanding with all questions answered.

## 2021-06-08 NOTE — Telephone Encounter (Signed)
Patient called and stated that he is feeling really sluggish and sleepy throughout the day and he believes its the new medication Ferrous Sulfate that Dr.Gessner proscribed. Seeking advise if possibly he could switched to another medication. Please advise.

## 2021-06-28 ENCOUNTER — Other Ambulatory Visit (INDEPENDENT_AMBULATORY_CARE_PROVIDER_SITE_OTHER): Payer: Medicaid Other

## 2021-06-28 DIAGNOSIS — D500 Iron deficiency anemia secondary to blood loss (chronic): Secondary | ICD-10-CM | POA: Diagnosis not present

## 2021-06-28 LAB — CBC
HCT: 38.2 % — ABNORMAL LOW (ref 39.0–52.0)
Hemoglobin: 12.3 g/dL — ABNORMAL LOW (ref 13.0–17.0)
MCHC: 32.2 g/dL (ref 30.0–36.0)
MCV: 78.8 fl (ref 78.0–100.0)
Platelets: 337 10*3/uL (ref 150.0–400.0)
RBC: 4.85 Mil/uL (ref 4.22–5.81)
RDW: 23.9 % — ABNORMAL HIGH (ref 11.5–15.5)
WBC: 3.3 10*3/uL — ABNORMAL LOW (ref 4.0–10.5)

## 2021-06-28 LAB — FERRITIN: Ferritin: 12.7 ng/mL — ABNORMAL LOW (ref 22.0–322.0)

## 2021-07-01 ENCOUNTER — Other Ambulatory Visit: Payer: Self-pay

## 2021-07-01 DIAGNOSIS — D500 Iron deficiency anemia secondary to blood loss (chronic): Secondary | ICD-10-CM

## 2021-07-01 DIAGNOSIS — D509 Iron deficiency anemia, unspecified: Secondary | ICD-10-CM

## 2021-07-01 MED ORDER — POLYSACCHARIDE IRON COMPLEX 150 MG PO CAPS: 150.0000 mg | ORAL_CAPSULE | Freq: Every day | ORAL | 3 refills | Status: DC

## 2021-08-05 ENCOUNTER — Other Ambulatory Visit: Payer: Self-pay | Admitting: Internal Medicine

## 2021-09-26 ENCOUNTER — Other Ambulatory Visit (INDEPENDENT_AMBULATORY_CARE_PROVIDER_SITE_OTHER): Payer: Medicaid Other

## 2021-09-26 DIAGNOSIS — D500 Iron deficiency anemia secondary to blood loss (chronic): Secondary | ICD-10-CM

## 2021-09-26 LAB — CBC WITH DIFFERENTIAL/PLATELET
Basophils Absolute: 0 10*3/uL (ref 0.0–0.1)
Basophils Relative: 0.2 % (ref 0.0–3.0)
Eosinophils Absolute: 0.1 10*3/uL (ref 0.0–0.7)
Eosinophils Relative: 1 % (ref 0.0–5.0)
HCT: 41.2 % (ref 39.0–52.0)
Hemoglobin: 13.6 g/dL (ref 13.0–17.0)
Lymphocytes Relative: 11.3 % — ABNORMAL LOW (ref 12.0–46.0)
Lymphs Abs: 0.9 10*3/uL (ref 0.7–4.0)
MCHC: 33 g/dL (ref 30.0–36.0)
MCV: 83.4 fl (ref 78.0–100.0)
Monocytes Absolute: 0.5 10*3/uL (ref 0.1–1.0)
Monocytes Relative: 7 % (ref 3.0–12.0)
Neutro Abs: 6.3 10*3/uL (ref 1.4–7.7)
Neutrophils Relative %: 80.5 % — ABNORMAL HIGH (ref 43.0–77.0)
Platelets: 345 10*3/uL (ref 150.0–400.0)
RBC: 4.95 Mil/uL (ref 4.22–5.81)
RDW: 19.7 % — ABNORMAL HIGH (ref 11.5–15.5)
WBC: 7.8 10*3/uL (ref 4.0–10.5)

## 2021-09-26 LAB — FERRITIN: Ferritin: 14.7 ng/mL — ABNORMAL LOW (ref 22.0–322.0)

## 2021-10-06 ENCOUNTER — Telehealth: Payer: Self-pay | Admitting: Internal Medicine

## 2021-10-06 ENCOUNTER — Other Ambulatory Visit: Payer: Self-pay | Admitting: Internal Medicine

## 2021-10-06 NOTE — Telephone Encounter (Signed)
Patient called regarding prilosec medication. Per patient, was told he needs prior authorization for that prescription. Please advise.  ?

## 2021-10-06 NOTE — Telephone Encounter (Signed)
The pharmacy had just sent over a refill request on this and I approved it. I called and told him. ?

## 2021-10-25 ENCOUNTER — Telehealth: Payer: Self-pay | Admitting: Hematology

## 2021-10-25 ENCOUNTER — Ambulatory Visit (INDEPENDENT_AMBULATORY_CARE_PROVIDER_SITE_OTHER): Payer: Medicaid Other | Admitting: Internal Medicine

## 2021-10-25 ENCOUNTER — Encounter: Payer: Self-pay | Admitting: Internal Medicine

## 2021-10-25 VITALS — BP 126/64 | HR 84 | Ht 71.0 in | Wt 179.0 lb

## 2021-10-25 DIAGNOSIS — K219 Gastro-esophageal reflux disease without esophagitis: Secondary | ICD-10-CM | POA: Diagnosis not present

## 2021-10-25 DIAGNOSIS — D509 Iron deficiency anemia, unspecified: Secondary | ICD-10-CM

## 2021-10-25 DIAGNOSIS — K220 Achalasia of cardia: Secondary | ICD-10-CM

## 2021-10-25 DIAGNOSIS — K3184 Gastroparesis: Secondary | ICD-10-CM | POA: Diagnosis not present

## 2021-10-25 DIAGNOSIS — K222 Esophageal obstruction: Secondary | ICD-10-CM

## 2021-10-25 MED ORDER — METOCLOPRAMIDE HCL 10 MG PO TABS: ORAL_TABLET | ORAL | 5 refills | Status: DC

## 2021-10-25 NOTE — Telephone Encounter (Signed)
Scheduled appt per 5/2 referral. Called pt, no answer and no vm available. Mailed updated calendar to pt.  ?

## 2021-10-25 NOTE — Patient Instructions (Signed)
We have placed a referral for you to go back and see Dr Irene Limbo. If you do not hear from them give them a call. ? ?Take your generic reglan as discussed. We sent in a refill. ? ? ?I appreciate the opportunity to care for you. ?Silvano Rusk, MD, [facility] ?

## 2021-10-25 NOTE — Progress Notes (Signed)
? [name] 36 y.o. [birth year] [id number] ? ?Assessment & Plan:  ? ?Encounter Diagnoses  ?Name Primary?  ? ACHALASIA Yes  ? GERD with stricture, status post Heller myotomy   ? Iron deficiency anemia, unspecified iron deficiency anemia type   ? Gastroparesis, suspected, question vagal nerve injury   ? ?Overall I think [name] is doing well.  His ferritin is not normal though his hemoglobin is.  That is a partial victory as it were.  Question should he have a boost with parenteral iron.  He is on meloxicam but is covered with PPI so I do not think there is necessarily chronic blood loss from that though it could be contributing.  Certainly his acid suppression and digestive issues could lead to some malabsorption of iron as well.  We will have him see Dr. Irene Limbo again.  Continue p.o. iron for now. ? ?He will try using his Reglan only in the morning and eating more frequent smaller meals.  He can go back to 3 times daily if needed.  I think his symptoms frequency with regurgitation and vomiting is not excessive at this time though it can be difficult to sort out given all of his problems.  I will see him in July and consider if we should repeat an endoscopy as he could potentially benefit from a repeat esophageal dilation where he has had a GERD induced stricture.  His psychiatric medications may be contributing to vomiting as well. ? ?I appreciate the opportunity to care for this patient. ?[facility]: [facility] ?Dr. Irene Limbo ? ?Subjective:  ? ?Chief Complaint: Follow-up of achalasia and iron deficiency anemia and GERD ? ?HPI ?[name] is a 36 year old man with a history of achalasia status post 2 surgeries (Heller myotomy) who has GERD with stricture related to this, also I suspect he has some postoperative gastroparesis issues.  He has been maintained on PPI as well as metoclopramide and takes iron supplementation.  In the past he did have 2 (I think) parenteral iron infusions under the direction of Dr.  Irene Limbo.  He was last seen there in the summer 2022.  No bleeding is reported.  He does take meloxicam chronically for cervical pain which he says helps.  He remains on twice daily PPI.  He notices that if he eats more frequent meals that are smaller he has less symptoms.  He had a flareup of regurgitation and vomiting a few weeks ago but that has calmed down.  He thinks he is probably at baseline with respect to these symptoms but he is never completely symptom-free except for a few weeks at a time.  He reports that he has grown to except his situation and that helps him deal with it better though he wishes he did not have these problems.  Particular dissatisfaction is related to having to excuse himself when he is out at a restaurant eating a meal so he can go regurgitate and vomit.  Weight is overall stable. ?Wt Readings from Last 3 Encounters:  ?10/25/21 179 lb (81.2 kg)  ?04/26/21 182 lb 8 oz (82.8 kg)  ?11/05/20 184 lb 9.6 oz (83.7 kg)  ? ? ? ?No Known Allergies ?Current Meds  ?Medication Sig  ? cholecalciferol (VITAMIN D3) 25 MCG (1000 UNIT) tablet Take 1,000 Units by mouth daily.  ? FLUoxetine (PROZAC) 40 MG capsule Take 40 mg by mouth every morning.  ? fluPHENAZine (PROLIXIN) 10 MG tablet Take 10 mg by mouth daily.  ? iron polysaccharides (FERREX 150) 150 MG  capsule Take 1 capsule (150 mg total) by mouth daily.  ? meloxicam (MOBIC) 15 MG tablet Take 15 mg by mouth daily.  ? metoCLOPramide (REGLAN) 10 MG tablet TAKE 1 TABLET BY MOUTH THREE TIMES A DAY BEFORE MEALS  ? Multiple Vitamins-Minerals (CENTRUM ADULTS PO) Take 1 capsule by mouth daily.  ? omeprazole (PRILOSEC) 40 MG capsule TAKE 1 CAPSULE BY MOUTH TWICE A DAY BEFORE BREAKFAST AND DINNER  ? oxcarbazepine (TRILEPTAL) 600 MG tablet Take 600 mg by mouth 2 (two) times daily.  ? PRESCRIPTION MEDICATION ? Name of med for neck pain-  PRN  ? QUEtiapine (SEROQUEL) 300 MG tablet Take 600 mg by mouth at bedtime.  ? ?Past Medical History:  ?Diagnosis Date  ?  Achalasia of esophagus   ? Anemia   ? Blood transfusion without reported diagnosis   ? Depression   ? Gastroparesis, suspected, question vagal nerve injury 03/25/2019  ? GERD with stricture   ? Schizoaffective disorder   ? Seizures ([place])   ? Last seizure 4-5 yrs ago - caused by Wellbutrin   ? ?Past Surgical History:  ?Procedure Laterality Date  ? BALLOON DILATION  02/12/2012  ? Procedure: BALLOON DILATION;  Surgeon: Gatha Mayer, MD;  Location: [facility];  Service: Endoscopy;  Laterality: N/A;  ? COLONOSCOPY N/A 10/06/2015  ? Procedure: COLONOSCOPY;  Surgeon: Manus Gunning, MD;  [facility] END[facility];  Service: Gastroenterology;  Laterality: N/A;  ? COLONOSCOPY    ? ESOPHAGOGASTRODUODENOSCOPY N/A 10/06/2015  ? Procedure: ESOPHAGOGASTRODUODENOSCOPY (EGD);  Surgeon: Manus Gunning, MD;  Location: [place];  Service: Gastroenterology;  Laterality: N/A;  ? ESOPHAGOGASTRODUODENOSCOPY (EGD) WITH ESOPHAGEAL DILATION  08/22/12  ? esophagram  08/31/2006  ? HELLER MYOTOMY  02/2007 AND 11/2015  ? with Dor Fundoplication- Dr. Garner Nash [facility]) twice  ? PANENDOSCOPY  11/08/2006  ? with submucosal injection  ? PANENDOSCOPY  03/09/2006  ? normal  ? UPPER GASTROINTESTINAL ENDOSCOPY    ? ?Social History  ? ?Social History Narrative  ? Single  ? Lives w/ mom  ? Employed at [employer]  ? ?family history includes Diabetes in his father. ? ? ?Review of Systems ?As above ? ?Objective:  ? Physical Exam ?BP 126/64   Pulse 84   Ht '5\' 11"'$  (1.803 m)   Wt 179 lb (81.2 kg)   BMI 24.97 kg/m?  ?He is alert and oriented x3 he has an appropriate mood and affect.  I do not identify any signs of tar dive dyskinesia. ? ?

## 2021-10-25 NOTE — Telephone Encounter (Signed)
Pt called back, I spoke to pt who is aware of appt date/time with Dr. Irene Limbo.  ?

## 2021-11-08 ENCOUNTER — Inpatient Hospital Stay: Payer: Medicaid Other | Attending: Hematology | Admitting: Hematology

## 2021-11-08 ENCOUNTER — Other Ambulatory Visit: Payer: Self-pay

## 2021-11-08 VITALS — BP 140/82 | HR 88 | Temp 97.7°F | Resp 20 | Wt 175.7 lb

## 2021-11-08 DIAGNOSIS — Z87891 Personal history of nicotine dependence: Secondary | ICD-10-CM | POA: Diagnosis not present

## 2021-11-08 DIAGNOSIS — Z79899 Other long term (current) drug therapy: Secondary | ICD-10-CM | POA: Insufficient documentation

## 2021-11-08 DIAGNOSIS — R140 Abdominal distension (gaseous): Secondary | ICD-10-CM | POA: Diagnosis not present

## 2021-11-08 DIAGNOSIS — K220 Achalasia of cardia: Secondary | ICD-10-CM | POA: Insufficient documentation

## 2021-11-08 DIAGNOSIS — Z833 Family history of diabetes mellitus: Secondary | ICD-10-CM | POA: Diagnosis not present

## 2021-11-08 DIAGNOSIS — K5900 Constipation, unspecified: Secondary | ICD-10-CM | POA: Diagnosis not present

## 2021-11-08 DIAGNOSIS — K3184 Gastroparesis: Secondary | ICD-10-CM | POA: Diagnosis not present

## 2021-11-08 DIAGNOSIS — D500 Iron deficiency anemia secondary to blood loss (chronic): Secondary | ICD-10-CM | POA: Diagnosis not present

## 2021-11-08 DIAGNOSIS — D509 Iron deficiency anemia, unspecified: Secondary | ICD-10-CM | POA: Insufficient documentation

## 2021-11-11 ENCOUNTER — Telehealth: Payer: Self-pay | Admitting: Hematology

## 2021-11-11 NOTE — Telephone Encounter (Signed)
Unable to leave message with follow-up appointments per 5/16 los. Mailed calendar.

## 2021-11-15 ENCOUNTER — Encounter: Payer: Self-pay | Admitting: Hematology

## 2021-11-15 NOTE — Progress Notes (Signed)
HEMATOLOGY/ONCOLOGY CLINIC NOTE  Date of Service: .11/08/2021  Patient Care Team: [facility] - General  CHIEF COMPLAINTS/PURPOSE OF CONSULTATION:  Follow-up for continued evaluation and management of iron deficiency anemia  HISTORY OF PRESENTING ILLNESS:   [name] is a wonderful 36 y.o. male who has been referred to [place] by Dr. Carlean Purl, MD  for evaluation and management of iron deficiency anemia. The pt reports that he is doing well overall.  The pt reports that he was referred by his GI for anemia over the last few years. The pt's iron levels have been lower over the last three years. The pt reports that he has gained weight since his last endoscopy, eating a more balanced diet. The pt reports that he does not feel weak or tired. The pt has returned to his baseline of 186 pounds from being as low as 140-150 pounds. The pt reports that he experiences constipation and hard stools, and his bowel movements are only regular after drinking this tea that helps him have normal stools. The pt notes the feeling and appearance of bloating in his stomach. He notes that he feels the food stays in his stomach longer after eating, feeling very gaseous. The pt does take daily iron pills. The pt notes he does take the Reglan TID before his meals.   The pt notes that he currently eats a yogurt at 5 am before work, lunch at 4 pm, and snacks throughout day if needed.  The pt denies any history of allergies to medications.  On review of systems, pt reports abdominal bloating, constipation and denies fatigue, bloody/black stools, abdominal pain, back pain, fevers, chills, night sweats, flank pain, problems passing urine, and any other symptoms.  INTERVAL HISTORY:  [name] is a wonderful 36 y.o. male who is here for a 1 year follow-up for continued evaluation and management of iron deficiency anemia .  Patient was last seen by his gastroenterologist Dr. Carlean Purl on  10/25/2021.  He continues to be on acid suppressants with PPI and is also on meloxicam.  He continues to have issues with his achalasia and gastroparesis for which she is following up with gastroenterology.  His recent labs in April showed normal hemoglobin but his ferritin levels have still been running low.  He notes no overt evidence of GI bleeding.  No obvious melena or bright red blood per rectum. No underlying significant weight loss.  He was sent back to [place] for evaluation and management of his iron deficiency despite taking p.o. iron.  We discussed his available labs and he is enthusiastic about replacing his iron aggressively with IV iron.     MEDICAL HISTORY:  Past Medical History:  Diagnosis Date   Achalasia of esophagus    Anemia    Blood transfusion without reported diagnosis    Depression    Gastroparesis, suspected, question vagal nerve injury 03/25/2019   GERD with stricture    Schizoaffective disorder    Seizures ([place])    Last seizure 4-5 yrs ago - caused by Wellbutrin     SURGICAL HISTORY: Past Surgical History:  Procedure Laterality Date   BALLOON DILATION  02/12/2012   Procedure: BALLOON DILATION;  Surgeon: Gatha Mayer, MD;  Location: [facility] ENDOSCOPY;  Service: Endoscopy;  Laterality: N/A;   COLONOSCOPY N/A 10/06/2015   Procedure: COLONOSCOPY;  Surgeon: Manus Gunning, MD;  Location: [facility] ENDOSCOPY;  Service: Gastroenterology;  Laterality: N/A;   COLONOSCOPY     ESOPHAGOGASTRODUODENOSCOPY N/A 10/06/2015  Procedure: ESOPHAGOGASTRODUODENOSCOPY (EGD);  Surgeon: Manus Gunning, MD;  Location: [place];  Service: Gastroenterology;  Laterality: N/A;   ESOPHAGOGASTRODUODENOSCOPY (EGD) WITH ESOPHAGEAL DILATION  08/22/12   esophagram  08/31/2006   HELLER MYOTOMY  02/2007 AND 11/2015   with Dor Fundoplication- Dr. Garner Nash [facility]) twice   PANENDOSCOPY  11/08/2006   with submucosal injection   PANENDOSCOPY  03/09/2006   normal   UPPER GASTROINTESTINAL  ENDOSCOPY      SOCIAL HISTORY: Social History   Socioeconomic History   Marital status: Single    Spouse name: Not on file   Number of children: 0   Years of education: Not on file   Highest education level: Not on file  Occupational History   Occupation: [employer]  Tobacco Use   Smoking status: Former    Packs/day: 0.50    Years: 2.00    Pack years: 1.00    Types: E-cigarettes, Cigarettes   Smokeless tobacco: Never   Tobacco comments:    given counseling sheet 01-26-12, pt started e-sig  Vaping Use   Vaping Use: Every day   Start date: 10/24/2013   Substances: Nicotine  Substance and Sexual Activity   Alcohol use: No   Drug use: No   Sexual activity: Not on file  Other Topics Concern   Not on file  Social History Narrative   Single   Lives w/ mom   Employed at [employer]   Financial Resource Strain: Not on file  Food Insecurity: Not on file  Transportation Needs: Not on file  Physical Activity: Not on file  Stress: Not on file  Social Connections: Not on file  Intimate Partner Violence: Not on file    FAMILY HISTORY: Family History  Problem Relation Age of Onset   Diabetes Father    Colon cancer Neg Hx    Stomach cancer Neg Hx    Pancreatic cancer Neg Hx    Rectal cancer Neg Hx    Heart disease Neg Hx    Colon polyps Neg Hx     ALLERGIES:  has No Known Allergies.  MEDICATIONS:  Current Outpatient Medications  Medication Sig Dispense Refill   cholecalciferol (VITAMIN D3) 25 MCG (1000 UNIT) tablet Take 1,000 Units by mouth daily.     FLUoxetine (PROZAC) 40 MG capsule Take 40 mg by mouth every morning.     fluPHENAZine (PROLIXIN) 10 MG tablet Take 10 mg by mouth daily.     iron polysaccharides (FERREX 150) 150 MG capsule Take 1 capsule (150 mg total) by mouth daily. 90 capsule 3   meloxicam (MOBIC) 15 MG tablet Take 15 mg by mouth daily.     metoCLOPramide (REGLAN) 10 MG tablet TAKE 1 TABLET BY MOUTH THREE TIMES A DAY  BEFORE MEALS 90 tablet 5   Multiple Vitamins-Minerals (CENTRUM ADULTS PO) Take 1 capsule by mouth daily.     omeprazole (PRILOSEC) 40 MG capsule TAKE 1 CAPSULE BY MOUTH TWICE A DAY BEFORE BREAKFAST AND DINNER 60 capsule 1   oxcarbazepine (TRILEPTAL) 600 MG tablet Take 600 mg by mouth 2 (two) times daily.     PRESCRIPTION MEDICATION ? Name of med for neck pain-  PRN     QUEtiapine (SEROQUEL) 300 MG tablet Take 600 mg by mouth at bedtime.  1   No current facility-administered medications for this visit.    REVIEW OF SYSTEMS:   10 Point review of Systems was done is negative except as noted above. PHYSICAL  EXAMINATION: ECOG PERFORMANCE STATUS: 0 - Asymptomatic  . Vitals:   11/08/21 1205  BP: 140/82  Pulse: 88  Resp: 20  Temp: 97.7 F (36.5 C)  SpO2: 99%   Filed Weights   11/08/21 1205  Weight: 175 lb 11.2 oz (79.7 kg)   .Body mass index is 24.51 kg/m. NAD GENERAL:alert, in no acute distress and comfortable SKIN: no acute rashes, no significant lesions EYES: conjunctiva are pink and non-injected, sclera anicteric OROPHARYNX: MMM, no exudates, no oropharyngeal erythema or ulceration NECK: supple, no JVD LYMPH:  no palpable lymphadenopathy in the cervical, axillary or inguinal regions LUNGS: clear to auscultation b/l with normal respiratory effort HEART: regular rate & rhythm ABDOMEN:  normoactive bowel sounds , non tender, not distended. Extremity: no pedal edema PSYCH: alert & oriented x 3 with fluent speech NEURO: no focal motor/sensory deficits  LABORATORY DATA:  I have reviewed the data as listed  .    Latest Ref Rng & Units 09/26/2021   11:26 AM 06/28/2021   10:01 AM 04/26/2021    4:02 PM  CBC  WBC 4.0 - 10.5 K/uL 7.8   3.3   4.0    Hemoglobin 13.0 - 17.0 g/dL 13.6   12.3   9.2    Hematocrit 39.0 - 52.0 % 41.2   38.2   29.1    Platelets 150.0 - 400.0 K/uL 345.0   337.0   403.0      .    Latest Ref Rng & Units 11/05/2020   12:57 PM 09/14/2020    9:03 AM  08/23/2020    4:20 PM  CMP  Glucose 70 - 99 mg/dL 98      BUN 6 - 20 mg/dL 10      Creatinine 0.61 - 1.24 mg/dL 0.99      Sodium 135 - 145 mmol/L 140      Potassium 3.5 - 5.1 mmol/L 3.8      Chloride 98 - 111 mmol/L 104      CO2 22 - 32 mmol/L 28      Calcium 8.9 - 10.3 mg/dL 9.5      Total Protein 6.5 - 8.1 g/dL 6.9   6.7   6.6    Total Bilirubin 0.3 - 1.2 mg/dL 0.4   0.4   0.2    Alkaline Phos 38 - 126 U/L 63   55   47    AST 15 - 41 U/L 23   26   167    ALT 0 - 44 U/L 35   68   324     . Lab Results  Component Value Date   IRON 77 11/05/2020   TIBC 343 11/05/2020   IRONPCTSAT 23 11/05/2020   (Iron and TIBC)  Lab Results  Component Value Date   FERRITIN 14.7 (L) 09/26/2021      RADIOGRAPHIC STUDIES: I have personally reviewed the radiological images as listed and agreed with the findings in the report. No results found.  ASSESSMENT & PLAN:   36 yo with   1) Iron deficiency anemia  Likely due to chronic GI losses .  Patient has been on chronic meloxicam.  Also on chronic PPI with acid suppression potentially leading to decreased iron absorption.  2) multiple GI issues including achalasia, GERD with stricture, gastroparesis. PLAN: -Patient's recent labs done with his gastroenterologist were reviewed in detail.  He has been significantly iron deficient despite p.o. iron replacement at this time. -He has previously required IV iron and  has tolerated this well.  He is enthusiastic about correcting his iron deficiency completely with additional IV iron to help with his fatigue. -We will set him up for IV Venofer 300 mg weekly for 3 doses. -He was also recommended to take a multivitamin or vitamin B complex 1 capsule p.o. daily to support accelerated hematopoiesis. -Continue follow-up with gastroenterology to manage his chronic GI issues.  FOLLOW UP: IV Venofer '300mg'$  weekly x 3 doses RTC with Dr Irene Limbo with labs in 3 months  The total time spent in the appointment was  20 minutes*.  All of the patient's questions were answered with apparent satisfaction. The patient knows to call the clinic with any problems, questions or concerns.   Sullivan Lone MD MS AAHIVMS [facility] Hematology/Oncology Physician [facility]  .*Total Encounter Time as defined by the Centers for Medicare and Medicaid Services includes, in addition to the face-to-face time of a patient visit (documented in the note above) non-face-to-face time: obtaining and reviewing outside history, ordering and reviewing medications, tests or procedures, care coordination (communications with other health care professionals or caregivers) and documentation in the medical record.

## 2021-11-18 ENCOUNTER — Inpatient Hospital Stay: Payer: Medicaid Other

## 2021-11-18 ENCOUNTER — Other Ambulatory Visit: Payer: Self-pay

## 2021-11-18 VITALS — BP 117/78 | HR 81 | Temp 98.1°F | Resp 17

## 2021-11-18 DIAGNOSIS — D500 Iron deficiency anemia secondary to blood loss (chronic): Secondary | ICD-10-CM

## 2021-11-18 DIAGNOSIS — D509 Iron deficiency anemia, unspecified: Secondary | ICD-10-CM | POA: Diagnosis not present

## 2021-11-18 MED ORDER — SODIUM CHLORIDE 0.9 % IV SOLN: Freq: Once | INTRAVENOUS | Status: AC

## 2021-11-18 MED ORDER — LORATADINE 10 MG PO TABS
10.0000 mg | ORAL_TABLET | Freq: Once | ORAL | Status: AC
  Administered 2021-11-18: 10 mg via ORAL
  Filled 2021-11-18: qty 1

## 2021-11-18 MED ORDER — ACETAMINOPHEN 325 MG PO TABS
650.0000 mg | ORAL_TABLET | Freq: Once | ORAL | Status: AC
  Administered 2021-11-18: 650 mg via ORAL
  Filled 2021-11-18: qty 2

## 2021-11-18 MED ORDER — SODIUM CHLORIDE 0.9 % IV SOLN
300.0000 mg | Freq: Once | INTRAVENOUS | Status: AC
  Administered 2021-11-18: 300 mg via INTRAVENOUS
  Filled 2021-11-18: qty 300

## 2021-11-18 NOTE — Patient Instructions (Signed)

## 2021-11-24 ENCOUNTER — Inpatient Hospital Stay: Payer: Medicaid Other | Attending: Hematology

## 2021-11-24 ENCOUNTER — Other Ambulatory Visit: Payer: Self-pay

## 2021-11-24 VITALS — BP 119/73 | HR 87 | Temp 98.6°F | Resp 14 | Wt 167.5 lb

## 2021-11-24 DIAGNOSIS — K922 Gastrointestinal hemorrhage, unspecified: Secondary | ICD-10-CM | POA: Insufficient documentation

## 2021-11-24 DIAGNOSIS — K219 Gastro-esophageal reflux disease without esophagitis: Secondary | ICD-10-CM | POA: Diagnosis not present

## 2021-11-24 DIAGNOSIS — D500 Iron deficiency anemia secondary to blood loss (chronic): Secondary | ICD-10-CM | POA: Diagnosis present

## 2021-11-24 DIAGNOSIS — Z79899 Other long term (current) drug therapy: Secondary | ICD-10-CM | POA: Insufficient documentation

## 2021-11-24 MED ORDER — SODIUM CHLORIDE 0.9 % IV SOLN
300.0000 mg | Freq: Once | INTRAVENOUS | Status: AC
  Administered 2021-11-24: 300 mg via INTRAVENOUS
  Filled 2021-11-24: qty 300

## 2021-11-24 MED ORDER — ACETAMINOPHEN 325 MG PO TABS
650.0000 mg | ORAL_TABLET | Freq: Once | ORAL | Status: AC
  Administered 2021-11-24: 650 mg via ORAL
  Filled 2021-11-24: qty 2

## 2021-11-24 MED ORDER — LORATADINE 10 MG PO TABS
10.0000 mg | ORAL_TABLET | Freq: Once | ORAL | Status: AC
  Administered 2021-11-24: 10 mg via ORAL
  Filled 2021-11-24: qty 1

## 2021-11-24 MED ORDER — SODIUM CHLORIDE 0.9 % IV SOLN: Freq: Once | INTRAVENOUS | Status: AC

## 2021-11-24 NOTE — Patient Instructions (Addendum)
Iron Sucrose Injection What is this medication? IRON SUCROSE (EYE ern SOO krose) treats low levels of iron (iron deficiency anemia) in people with kidney disease. Iron is a mineral that plays an important role in making red blood cells, which carry oxygen from your lungs to the rest of your body. This medicine may be used for other purposes; ask your health care provider or pharmacist if you have questions. COMMON BRAND NAME(S): Venofer What should I tell my care team before I take this medication? They need to know if you have any of these conditions: Anemia not caused by low iron levels Heart disease High levels of iron in the blood Kidney disease Liver disease An unusual or allergic reaction to iron, other medications, foods, dyes, or preservatives Pregnant or trying to get pregnant Breast-feeding How should I use this medication? This medication is for infusion into a vein. It is given in a hospital or clinic setting. Talk to your care team about the use of this medication in children. While this medication may be prescribed for children as young as 2 years for selected conditions, precautions do apply. Overdosage: If you think you have taken too much of this medicine contact a poison control center or emergency room at once. NOTE: This medicine is only for you. Do not share this medicine with others. What if I miss a dose? It is important not to miss your dose. Call your care team if you are unable to keep an appointment. What may interact with this medication? Do not take this medication with any of the following: Deferoxamine Dimercaprol Other iron products This medication may also interact with the following: Chloramphenicol Deferasirox This list may not describe all possible interactions. Give your health care provider a list of all the medicines, herbs, non-prescription drugs, or dietary supplements you use. Also tell them if you smoke, drink alcohol, or use illegal drugs.  Some items may interact with your medicine. What should I watch for while using this medication? Visit your care team regularly. Tell your care team if your symptoms do not start to get better or if they get worse. You may need blood work done while you are taking this medication. You may need to follow a special diet. Talk to your care team. Foods that contain iron include: whole grains/cereals, dried fruits, beans, or peas, leafy green vegetables, and organ meats (liver, kidney). What side effects may I notice from receiving this medication? Side effects that you should report to your care team as soon as possible: Allergic reactions--skin rash, itching, hives, swelling of the face, lips, tongue, or throat Low blood pressure--dizziness, feeling faint or lightheaded, blurry vision Shortness of breath Side effects that usually do not require medical attention (report to your care team if they continue or are bothersome): Flushing Headache Joint pain Muscle pain Nausea Pain, redness, or irritation at injection site This list may not describe all possible side effects. Call your doctor for medical advice about side effects. You may report side effects to FDA at 1-800-FDA-1088. Where should I keep my medication? This medication is given in a hospital or clinic and will not be stored at home. NOTE: This sheet is a summary. It may not cover all possible information. If you have questions about this medicine, talk to your doctor, pharmacist, or health care provider.  2023 Elsevier/Gold Standard (2020-11-05 00:00:00) Iron-Rich Diet  Iron is a mineral that helps your body produce hemoglobin. Hemoglobin is a protein in red blood cells that carries  oxygen to your body's tissues. Eating too little iron may cause you to feel weak and tired, and it can increase your risk of infection. Iron is naturally found in many foods, and many foods have iron added to them (are iron-fortified). You may need to  follow an iron-rich diet if you do not have enough iron in your body due to certain medical conditions. The amount of iron that you need each day depends on your age, your sex, and any medical conditions you have. Follow instructions from your health care provider or a dietitian about how much iron you should eat each day. What are tips for following this plan? Reading food labels Check food labels to see how many milligrams (mg) of iron are in each serving. Cooking Cook foods in pots and pans that are made from iron. Take these steps to make it easier for your body to absorb iron from certain foods: Soak beans overnight before cooking. Soak whole grains overnight and drain them before using. Ferment flours before baking, such as by using yeast in bread dough. Meal planning When you eat foods that contain iron, you should eat them with foods that are high in vitamin C. These include oranges, peppers, tomatoes, potatoes, and mangoes. Vitamin C helps your body absorb iron. Certain foods and drinks prevent your body from absorbing iron properly. Avoid eating these foods in the same meal as iron-rich foods or with iron supplements. These foods include: Coffee, black tea, and red wine. Milk, dairy products, and foods that are high in calcium. Beans and soybeans. Whole grains. General information Take iron supplements only as told by your health care provider. An overdose of iron can be life-threatening. If you were prescribed iron supplements, take them with orange juice or a vitamin C supplement. When you eat iron-fortified foods or take an iron supplement, you should also eat foods that naturally contain iron, such as meat, poultry, and fish. Eating naturally iron-rich foods helps your body absorb the iron that is added to other foods or contained in a supplement. Iron from animal sources is better absorbed than iron from plant sources. What foods should I eat? Fruits Prunes. Raisins. Eat fruits  high in vitamin C, such as oranges, grapefruits, and strawberries, with iron-rich foods. Vegetables Spinach (cooked). Green peas. Broccoli. Fermented vegetables. Eat vegetables high in vitamin C, such as leafy greens, potatoes, bell peppers, and tomatoes, with iron-rich foods. Grains Iron-fortified breakfast cereal. Iron-fortified whole-wheat bread. Enriched rice. Sprouted grains. Meats and other proteins Beef liver. Beef. [place]. Chicken. Oysters. Shrimp. [place]. Sardines. Chickpeas. Nuts. Tofu. Pumpkin seeds. Beverages Tomato juice. Fresh orange juice. Prune juice. Hibiscus tea. Iron-fortified instant breakfast shakes. Sweets and desserts Blackstrap molasses. Seasonings and condiments Tahini. Fermented soy sauce. Other foods Wheat germ. The items listed above may not be a complete list of recommended foods and beverages. Contact a dietitian for more information. What foods should I limit? These are foods that should be limited while eating iron-rich foods as they can reduce the absorption of iron in your body. Grains Whole grains. Bran cereal. Bran flour. Meats and other proteins Soybeans. Products made from soy protein. Black beans. Lentils. Mung beans. Split peas. Dairy Milk. Cream. Cheese. Yogurt. Cottage cheese. Beverages Coffee. Black tea. Red wine. Sweets and desserts Cocoa. Chocolate. Ice cream. Seasonings and condiments Basil. Oregano. Large amounts of parsley. The items listed above may not be a complete list of foods and beverages you should limit. Contact a dietitian for more information. Summary Iron  is a mineral that helps your body produce hemoglobin. Hemoglobin is a protein in red blood cells that carries oxygen to your body's tissues. Iron is naturally found in many foods, and many foods have iron added to them (are iron-fortified). When you eat foods that contain iron, you should eat them with foods that are high in vitamin C. Vitamin C helps your body absorb  iron. Certain foods and drinks prevent your body from absorbing iron properly, such as whole grains and dairy products. You should avoid eating these foods in the same meal as iron-rich foods or with iron supplements. This information is not intended to replace advice given to you by your health care provider. Make sure you discuss any questions you have with your health care provider. Document Revised: 05/24/2020 Document Reviewed: 05/24/2020 Elsevier Patient Education  [place].

## 2021-11-24 NOTE — Progress Notes (Signed)
Pt observed for 30 minutes post Venofer infusion. Pt tolerated trtmt well w/out incident. VSS at discharge.  Ambulatory to lobby.   

## 2021-11-25 ENCOUNTER — Ambulatory Visit: Payer: Medicaid Other

## 2021-12-02 ENCOUNTER — Inpatient Hospital Stay: Payer: Medicaid Other

## 2021-12-02 ENCOUNTER — Other Ambulatory Visit: Payer: Self-pay

## 2021-12-02 VITALS — BP 135/73 | HR 89 | Temp 98.6°F | Resp 17

## 2021-12-02 DIAGNOSIS — D500 Iron deficiency anemia secondary to blood loss (chronic): Secondary | ICD-10-CM | POA: Diagnosis not present

## 2021-12-02 MED ORDER — ACETAMINOPHEN 325 MG PO TABS
650.0000 mg | ORAL_TABLET | Freq: Once | ORAL | Status: AC
  Administered 2021-12-02: 650 mg via ORAL
  Filled 2021-12-02: qty 2

## 2021-12-02 MED ORDER — SODIUM CHLORIDE 0.9 % IV SOLN: Freq: Once | INTRAVENOUS | Status: AC

## 2021-12-02 MED ORDER — SODIUM CHLORIDE 0.9 % IV SOLN
300.0000 mg | Freq: Once | INTRAVENOUS | Status: AC
  Administered 2021-12-02: 300 mg via INTRAVENOUS
  Filled 2021-12-02: qty 300

## 2021-12-02 MED ORDER — LORATADINE 10 MG PO TABS
10.0000 mg | ORAL_TABLET | Freq: Once | ORAL | Status: AC
  Administered 2021-12-02: 10 mg via ORAL
  Filled 2021-12-02: qty 1

## 2021-12-02 NOTE — Patient Instructions (Signed)

## 2021-12-13 ENCOUNTER — Other Ambulatory Visit: Payer: Self-pay | Admitting: Internal Medicine

## 2022-01-12 ENCOUNTER — Ambulatory Visit (INDEPENDENT_AMBULATORY_CARE_PROVIDER_SITE_OTHER): Payer: Medicaid Other | Admitting: Internal Medicine

## 2022-01-12 ENCOUNTER — Encounter: Payer: Self-pay | Admitting: Internal Medicine

## 2022-01-12 VITALS — BP 100/60 | HR 87 | Ht 68.0 in | Wt 159.0 lb

## 2022-01-12 DIAGNOSIS — D509 Iron deficiency anemia, unspecified: Secondary | ICD-10-CM | POA: Diagnosis not present

## 2022-01-12 DIAGNOSIS — K219 Gastro-esophageal reflux disease without esophagitis: Secondary | ICD-10-CM | POA: Diagnosis not present

## 2022-01-12 DIAGNOSIS — K220 Achalasia of cardia: Secondary | ICD-10-CM

## 2022-01-12 DIAGNOSIS — K3184 Gastroparesis: Secondary | ICD-10-CM

## 2022-01-12 DIAGNOSIS — K222 Esophageal obstruction: Secondary | ICD-10-CM

## 2022-01-12 NOTE — Patient Instructions (Addendum)
If you are age 38 or older, your body mass index should be between 23-30. Your Body mass index is 24.18 kg/m. If this is out of the aforementioned range listed, please consider follow up with your Primary Care Provider.  If you are age 20 or younger, your body mass index should be between 19-25. Your Body mass index is 24.18 kg/m. If this is out of the aformentioned range listed, please consider follow up with your Primary Care Provider.   ________________________________________________________  The  GI providers would like to encourage you to use [facility] to communicate with providers for non-urgent requests or questions.  Due to long hold times on the telephone, sending your provider a message by [facility] may be a faster and more efficient way to get a response.  Please allow 48 business hours for a response.  Please remember that this is for non-urgent requests.  _______________________________________________________  Please call in November to schedule a follow up a appointment in January.  I appreciate the opportunity to care for you. Silvano Rusk, MD, [facility]

## 2022-01-12 NOTE — Progress Notes (Signed)
[name] 36 y.o. [birth year] [id number]  Assessment & Plan:   Encounter Diagnoses  Name Primary?   ACHALASIA Yes   GERD with stricture, status post Heller myotomy    Iron deficiency anemia, unspecified iron deficiency anemia type    Gastroparesis, suspected, question vagal nerve injury    Symptomatically improved.  His weight is down but over the years it has fluctuated in and out of the ranges we are seeing.  Continue to monitor.  Continue diet modifications.  Return in 6 months for reassessment of his achalasia symptoms and monitor for tardive dyskinesia on metoclopramide.  I have advised him to call back sooner should his symptoms flare.     Subjective:   Chief Complaint: Follow-up of achalasia and iron deficiency anemia  HPI [name] is a 36 year old man with schizoaffective disorder and a history of achalasia status post 2 surgeries (Heller myotomy) who has GERD with stricture related to this, also I suspect he has some postoperative gastroparesis issues.  He has been maintained on PPI as well as metoclopramide and takes iron supplementation.  He was last seen in May and was having some increasing problems with vomiting question dysphagia.  He had persistent low ferritin but normal hemoglobin.  He has modified his diet eating smaller meals more frequently and he is pausing in between ingestion of food boluses and is having less symptoms of regurgitation.  He feels satisfied with his quality of life.  He saw Dr. Irene Limbo and has had 2 iron infusions and has follow-up in August.  He feels better with more energy he says.  He continues to work at [employer] working the third shift now.  He has gotten used to that he says.  Wt Readings from Last 3 Encounters:  01/12/22 159 lb (72.1 kg)  11/24/21 167 lb 8 oz (76 kg)  11/08/21 175 lb 11.2 oz (79.7 kg)     No Known Allergies Current Meds  Medication Sig   Ascorbic Acid (VITAMIN C) 100 MG tablet Take 100 mg by mouth daily.    cholecalciferol (VITAMIN D3) 25 MCG (1000 UNIT) tablet Take 1,000 Units by mouth daily.   Cyanocobalamin (VITAMIN B 12 PO) Take 1 Capful by mouth daily.   FLUoxetine (PROZAC) 40 MG capsule Take 40 mg by mouth every morning.   fluPHENAZine (PROLIXIN) 10 MG tablet Take 10 mg by mouth daily.   iron polysaccharides (FERREX 150) 150 MG capsule Take 1 capsule (150 mg total) by mouth daily.   meloxicam (MOBIC) 15 MG tablet Take 15 mg by mouth daily.   metoCLOPramide (REGLAN) 10 MG tablet TAKE 1 TABLET BY MOUTH THREE TIMES A DAY BEFORE MEALS   Multiple Vitamins-Minerals (CENTRUM ADULTS PO) Take 1 capsule by mouth daily.   omeprazole (PRILOSEC) 40 MG capsule TAKE 1 CAPSULE BY MOUTH TWICE A DAY BEFORE BREAKFAST AND DINNER   oxcarbazepine (TRILEPTAL) 600 MG tablet Take 600 mg by mouth 2 (two) times daily.   PRESCRIPTION MEDICATION ? Name of med for neck pain-  PRN   QUEtiapine (SEROQUEL) 300 MG tablet Take 600 mg by mouth at bedtime.   Past Medical History:  Diagnosis Date   Achalasia of esophagus    Anemia    Blood transfusion without reported diagnosis    Depression    Gastroparesis, suspected, question vagal nerve injury 03/25/2019   GERD with stricture    Schizoaffective disorder    Seizures ([place])    Last seizure 4-5 yrs ago - caused by Wellbutrin  Past Surgical History:  Procedure Laterality Date   BALLOON DILATION  02/12/2012   Procedure: BALLOON DILATION;  Surgeon: Gatha Mayer, MD;  Location: [facility];  Service: Endoscopy;  Laterality: N/A;   COLONOSCOPY N/A 10/06/2015   Procedure: COLONOSCOPY;  Surgeon: Manus Gunning, MD;  Location: [facility];  Service: Gastroenterology;  Laterality: N/A;   COLONOSCOPY     ESOPHAGOGASTRODUODENOSCOPY N/A 10/06/2015   Procedure: ESOPHAGOGASTRODUODENOSCOPY (EGD);  Surgeon: Manus Gunning, MD;  Location: [place];  Service: Gastroenterology;  Laterality: N/A;   ESOPHAGOGASTRODUODENOSCOPY (EGD) WITH ESOPHAGEAL DILATION  08/22/12    esophagram  08/31/2006   HELLER MYOTOMY  02/2007 AND 11/2015   with Dor Fundoplication- Dr. Garner Nash [facility]) twice   PANENDOSCOPY  11/08/2006   with submucosal injection   PANENDOSCOPY  03/09/2006   normal   UPPER GASTROINTESTINAL ENDOSCOPY     Social History   Social History Narrative   Single   Lives w/ mom   Employed at [employer]   family history includes Diabetes in his father.   Review of Systems As per HPI  Objective:   Physical Exam BP 100/60   Pulse 87   Ht '5\' 8"'$  (1.727 m)   Wt 159 lb (72.1 kg)   BMI 24.18 kg/m  Well-developed well-nourished no acute distress.  I do not see signs of tar dive dyskinesia. Data reviewed includes hematology visit Nov 08, 2021

## 2022-02-03 ENCOUNTER — Other Ambulatory Visit: Payer: Self-pay | Admitting: *Deleted

## 2022-02-03 DIAGNOSIS — D500 Iron deficiency anemia secondary to blood loss (chronic): Secondary | ICD-10-CM

## 2022-02-08 ENCOUNTER — Other Ambulatory Visit: Payer: Self-pay

## 2022-02-08 ENCOUNTER — Inpatient Hospital Stay: Payer: Medicaid Other

## 2022-02-08 ENCOUNTER — Other Ambulatory Visit: Payer: Medicaid Other

## 2022-02-08 ENCOUNTER — Inpatient Hospital Stay: Payer: Medicaid Other | Attending: Hematology | Admitting: Hematology

## 2022-02-08 VITALS — BP 122/72 | HR 86 | Temp 98.1°F | Resp 20 | Wt 159.0 lb

## 2022-02-08 DIAGNOSIS — D500 Iron deficiency anemia secondary to blood loss (chronic): Secondary | ICD-10-CM

## 2022-02-08 DIAGNOSIS — Z79899 Other long term (current) drug therapy: Secondary | ICD-10-CM | POA: Diagnosis not present

## 2022-02-08 DIAGNOSIS — K219 Gastro-esophageal reflux disease without esophagitis: Secondary | ICD-10-CM | POA: Diagnosis not present

## 2022-02-08 DIAGNOSIS — K5900 Constipation, unspecified: Secondary | ICD-10-CM | POA: Diagnosis not present

## 2022-02-08 DIAGNOSIS — R140 Abdominal distension (gaseous): Secondary | ICD-10-CM | POA: Insufficient documentation

## 2022-02-08 DIAGNOSIS — Z833 Family history of diabetes mellitus: Secondary | ICD-10-CM | POA: Insufficient documentation

## 2022-02-08 DIAGNOSIS — Z87891 Personal history of nicotine dependence: Secondary | ICD-10-CM | POA: Insufficient documentation

## 2022-02-08 DIAGNOSIS — D509 Iron deficiency anemia, unspecified: Secondary | ICD-10-CM | POA: Diagnosis not present

## 2022-02-08 DIAGNOSIS — F17290 Nicotine dependence, other tobacco product, uncomplicated: Secondary | ICD-10-CM | POA: Diagnosis not present

## 2022-02-08 LAB — CBC WITH DIFFERENTIAL (CANCER CENTER ONLY)
Abs Immature Granulocytes: 0.02 10*3/uL (ref 0.00–0.07)
Basophils Absolute: 0 10*3/uL (ref 0.0–0.1)
Basophils Relative: 0 %
Eosinophils Absolute: 0.1 10*3/uL (ref 0.0–0.5)
Eosinophils Relative: 1 %
HCT: 40.6 % (ref 39.0–52.0)
Hemoglobin: 14.6 g/dL (ref 13.0–17.0)
Immature Granulocytes: 0 %
Lymphocytes Relative: 12 %
Lymphs Abs: 0.9 10*3/uL (ref 0.7–4.0)
MCH: 29.6 pg (ref 26.0–34.0)
MCHC: 36 g/dL (ref 30.0–36.0)
MCV: 82.2 fL (ref 80.0–100.0)
Monocytes Absolute: 0.6 10*3/uL (ref 0.1–1.0)
Monocytes Relative: 9 %
Neutro Abs: 5.6 10*3/uL (ref 1.7–7.7)
Neutrophils Relative %: 78 %
Platelet Count: 296 10*3/uL (ref 150–400)
RBC: 4.94 MIL/uL (ref 4.22–5.81)
RDW: 15.3 % (ref 11.5–15.5)
WBC Count: 7.2 10*3/uL (ref 4.0–10.5)
nRBC: 0 % (ref 0.0–0.2)

## 2022-02-08 LAB — CMP (CANCER CENTER ONLY)
ALT: 14 U/L (ref 0–44)
AST: 14 U/L — ABNORMAL LOW (ref 15–41)
Albumin: 4.4 g/dL (ref 3.5–5.0)
Alkaline Phosphatase: 73 U/L (ref 38–126)
Anion gap: 2 — ABNORMAL LOW (ref 5–15)
BUN: <5 mg/dL — ABNORMAL LOW (ref 6–20)
CO2: 27 mmol/L (ref 22–32)
Calcium: 9.6 mg/dL (ref 8.9–10.3)
Chloride: 106 mmol/L (ref 98–111)
Creatinine: 0.79 mg/dL (ref 0.61–1.24)
GFR, Estimated: >60 mL/min (ref >60)
Glucose, Bld: 102 mg/dL — ABNORMAL HIGH (ref 70–99)
Potassium: 3.7 mmol/L (ref 3.5–5.1)
Sodium: 135 mmol/L (ref 135–145)
Total Bilirubin: 0.3 mg/dL (ref 0.3–1.2)
Total Protein: 7.2 g/dL (ref 6.5–8.1)

## 2022-02-08 LAB — SAMPLE TO BLOOD BANK

## 2022-02-08 LAB — FERRITIN: Ferritin: 84 ng/mL (ref 24–336)

## 2022-02-09 ENCOUNTER — Telehealth: Payer: Self-pay | Admitting: Hematology

## 2022-02-09 NOTE — Telephone Encounter (Signed)
Scheduled follow-up appointment per 8/15 los. Patient is aware.

## 2022-02-10 ENCOUNTER — Other Ambulatory Visit: Payer: Self-pay | Admitting: Internal Medicine

## 2022-02-15 ENCOUNTER — Encounter: Payer: Self-pay | Admitting: Hematology

## 2022-02-15 NOTE — Progress Notes (Signed)
HEMATOLOGY/ONCOLOGY CLINIC NOTE  Date of Service: .02/08/2022  Patient Care Team: [facility] [place] as PCP - General  CHIEF COMPLAINTS/PURPOSE OF CONSULTATION:  Follow-up for continued evaluation and management of iron deficiency anemia  HISTORY OF PRESENTING ILLNESS:   [name] is a wonderful 36 y.o. male who has been referred to [place] by Dr. Carlean Purl, MD  for evaluation and management of iron deficiency anemia. The pt reports that he is doing well overall.  The pt reports that he was referred by his GI for anemia over the last few years. The pt's iron levels have been lower over the last three years. The pt reports that he has gained weight since his last endoscopy, eating a more balanced diet. The pt reports that he does not feel weak or tired. The pt has returned to his baseline of 186 pounds from being as low as 140-150 pounds. The pt reports that he experiences constipation and hard stools, and his bowel movements are only regular after drinking this tea that helps him have normal stools. The pt notes the feeling and appearance of bloating in his stomach. He notes that he feels the food stays in his stomach longer after eating, feeling very gaseous. The pt does take daily iron pills. The pt notes he does take the Reglan TID before his meals.   The pt notes that he currently eats a yogurt at 5 am before work, lunch at 4 pm, and snacks throughout day if needed.  The pt denies any history of allergies to medications.  On review of systems, pt reports abdominal bloating, constipation and denies fatigue, bloody/black stools, abdominal pain, back pain, fevers, chills, night sweats, flank pain, problems passing urine, and any other symptoms.  INTERVAL HISTORY:  [name] is a 37 y.o. male who is here for continued evaluation and management of iron deficiency anemia.  He continues to follow with Dr. Carlean Purl for management of his gastroparesis and other GI issues. He does  note improvement in his energy levels after his IV iron but notes that his energy levels are now again starting to dwindle. No overt evidence of GI bleeding noted by the patient. Labs done today were discussed with him in detail.    MEDICAL HISTORY:  Past Medical History:  Diagnosis Date   Achalasia of esophagus    Anemia    Blood transfusion without reported diagnosis    Depression    Gastroparesis, suspected, question vagal nerve injury 03/25/2019   GERD with stricture    Schizoaffective disorder    Seizures ([place])    Last seizure 4-5 yrs ago - caused by Wellbutrin     SURGICAL HISTORY: Past Surgical History:  Procedure Laterality Date   BALLOON DILATION  02/12/2012   Procedure: BALLOON DILATION;  Surgeon: Gatha Mayer, MD;  Location: [facility];  Service: Endoscopy;  Laterality: N/A;   COLONOSCOPY N/A 10/06/2015   Procedure: COLONOSCOPY;  Surgeon: Manus Gunning, MD;  Location[facility] END[facility]COPY;  Service: Gastroenterology;  Laterality: N/A;   COLONOSCOPY     ESOPHAGOGASTRODUODENOSCOPY N/A 10/06/2015   Procedure: ESOPHAGOGASTRODUODENOSCOPY (EGD);  Surgeon: Manus Gunning, MD;  Location: [place];  Service: Gastroenterology;  Laterality: N/A;   ESOPHAGOGASTRODUODENOSCOPY (EGD) WITH ESOPHAGEAL DILATION  08/22/12   esophagram  08/31/2006   HELLER MYOTOMY  02/2007 AND 11/2015   with Dor Fundoplication- Dr. Garner Nash [facility]) twice   PANENDOSCOPY  11/08/2006   with submucosal injection   PANENDOSCOPY  03/09/2006   normal   UPPER  GASTROINTESTINAL ENDOSCOPY      SOCIAL HISTORY: Social History   Socioeconomic History   Marital status: Single    Spouse name: Not on file   Number of children: 0   Years of education: Not on file   Highest education level: Not on file  Occupational History   Occupation: [employer]  Tobacco Use   Smoking status: Former    Packs/day: 0.50    Years: 2.00    Total pack years: 1.00    Types: E-cigarettes, Cigarettes   Smokeless tobacco:  Never   Tobacco comments:    given counseling sheet 01-26-12, pt started e-sig  Vaping Use   Vaping Use: Every day   Start date: 10/24/2013   Substances: Nicotine  Substance and Sexual Activity   Alcohol use: No   Drug use: No   Sexual activity: Not on file  Other Topics Concern   Not on file  Social History Narrative   Single   Lives w/ mom   Employed at [employer] Determinants of Health   Financial Resource Strain: Not on file  Food Insecurity: Not on file  Transportation Needs: Not on file  Physical Activity: Not on file  Stress: Not on file  Social Connections: Not on file  Intimate Partner Violence: Not on file    FAMILY HISTORY: Family History  Problem Relation Age of Onset   Diabetes Father    Colon cancer Neg Hx    Stomach cancer Neg Hx    Pancreatic cancer Neg Hx    Rectal cancer Neg Hx    Heart disease Neg Hx    Colon polyps Neg Hx     ALLERGIES:  has No Known Allergies.  MEDICATIONS:  Current Outpatient Medications  Medication Sig Dispense Refill   Ascorbic Acid (VITAMIN C) 100 MG tablet Take 100 mg by mouth daily.     cholecalciferol (VITAMIN D3) 25 MCG (1000 UNIT) tablet Take 1,000 Units by mouth daily.     Cyanocobalamin (VITAMIN B 12 PO) Take 1 Capful by mouth daily.     FLUoxetine (PROZAC) 40 MG capsule Take 40 mg by mouth every morning.     fluPHENAZine (PROLIXIN) 10 MG tablet Take 10 mg by mouth daily.     iron polysaccharides (FERREX 150) 150 MG capsule Take 1 capsule (150 mg total) by mouth daily. 90 capsule 3   meloxicam (MOBIC) 15 MG tablet Take 15 mg by mouth daily.     metoCLOPramide (REGLAN) 10 MG tablet TAKE 1 TABLET BY MOUTH THREE TIMES A DAY BEFORE MEALS 90 tablet 5   Multiple Vitamins-Minerals (CENTRUM ADULTS PO) Take 1 capsule by mouth daily.     omeprazole (PRILOSEC) 40 MG capsule TAKE 1 CAPSULE BY MOUTH TWICE A DAY BEFORE BREAKFAST & DINNER 60 capsule 1   oxcarbazepine (TRILEPTAL) 600 MG tablet Take 600 mg by mouth 2  (two) times daily.     PRESCRIPTION MEDICATION ? Name of med for neck pain-  PRN     QUEtiapine (SEROQUEL) 300 MG tablet Take 600 mg by mouth at bedtime.  1   No current facility-administered medications for this visit.    REVIEW OF SYSTEMS:   10 Point review of Systems was done is negative except as noted above.  PHYSICAL EXAMINATION: ECOG PERFORMANCE STATUS: 0 - Asymptomatic  . Vitals:   02/08/22 1209  BP: 122/72  Pulse: 86  Resp: 20  Temp: 98.1 F (36.7 C)  SpO2: 98%   Filed Weights  02/08/22 1209  Weight: 159 lb (72.1 kg)   .Body mass index is 24.18 kg/m. NAD GENERAL:alert, in no acute distress and comfortable SKIN: no acute rashes, no significant lesions EYES: conjunctiva are pink and non-injected, sclera anicteric OROPHARYNX: MMM, no exudates, no oropharyngeal erythema or ulceration NECK: supple, no JVD LYMPH:  no palpable lymphadenopathy in the cervical, axillary or inguinal regions LUNGS: clear to auscultation b/l with normal respiratory effort HEART: regular rate & rhythm ABDOMEN:  normoactive bowel sounds , non tender, not distended. Extremity: no pedal edema PSYCH: alert & oriented x 3 with fluent speech NEURO: no focal motor/sensory deficits   LABORATORY DATA:  I have reviewed the data as listed  .    Latest Ref Rng & Units 02/08/2022   11:45 AM 09/26/2021   11:26 AM 06/28/2021   10:01 AM  CBC  WBC 4.0 - 10.5 K/uL 7.2  7.8  3.3   Hemoglobin 13.0 - 17.0 g/dL 14.6  13.6  12.3   Hematocrit 39.0 - 52.0 % 40.6  41.2  38.2   Platelets 150 - 400 K/uL 296  345.0  337.0     .    Latest Ref Rng & Units 02/08/2022   11:45 AM 11/05/2020   12:57 PM 09/14/2020    9:03 AM  CMP  Glucose 70 - 99 mg/dL 102  98    BUN 6 - 20 mg/dL <5  C 10    Creatinine 0.61 - 1.24 mg/dL 0.79  0.99    Sodium 135 - 145 mmol/L 135  140    Potassium 3.5 - 5.1 mmol/L 3.7  3.8    Chloride 98 - 111 mmol/L 106  104    CO2 22 - 32 mmol/L 27  28    Calcium 8.9 - 10.3 mg/dL 9.6  9.5     Total Protein 6.5 - 8.1 g/dL 7.2  6.9  6.7   Total Bilirubin 0.3 - 1.2 mg/dL 0.3  0.4  0.4   Alkaline Phos 38 - 126 U/L 73  63  55   AST 15 - 41 U/L '14  23  26   '$ ALT 0 - 44 U/L 14  35  68     C Corrected result   . Lab Results  Component Value Date   IRON 77 11/05/2020   TIBC 343 11/05/2020   IRONPCTSAT 23 11/05/2020   (Iron and TIBC)  Lab Results  Component Value Date   FERRITIN 84 02/08/2022      RADIOGRAPHIC STUDIES: I have personally reviewed the radiological images as listed and agreed with the findings in the report. No results found.  ASSESSMENT & PLAN:   36 yo with   1) Iron deficiency anemia  Likely due to chronic GI losses .  Patient has been on chronic meloxicam.  Also on chronic PPI with acid suppression potentially leading to decreased iron absorption.  2) multiple GI issues including achalasia, GERD with stricture, gastroparesis. PLAN: Patient's labs done today were discussed with him in detail CBC shows improvement in his hemoglobin up to 14.6 with normal WBC count and platelets CMP within normal limits Ferritin has improved from 14.7 up to 84. No overt GI bleeding at this time. No clear indication for additional IV iron at this time. Continue p.o. iron replacement -Continue vitamin B complex 1 capsule p.o. daily Continue follow-up with Dr. Carlean Purl for management of his chronic GI issues  FOLLOW UP: Return to clinic with Dr. Irene Limbo with labs in 6 months  The  total time spent in the appointment was 20 minutes*.  All of the patient's questions were answered with apparent satisfaction. The patient knows to call the clinic with any problems, questions or concerns.   Sullivan Lone MD MS AAHIVMS [facility] Hematology/Oncology Physician [facility]  .*Total Encounter Time as defined by the Centers for Medicare and Medicaid Services includes, in addition to the face-to-face time of a patient visit (documented in the note above)  non-face-to-face time: obtaining and reviewing outside history, ordering and reviewing medications, tests or procedures, care coordination (communications with other health care professionals or caregivers) and documentation in the medical record.

## 2022-04-17 ENCOUNTER — Other Ambulatory Visit: Payer: Self-pay | Admitting: Internal Medicine

## 2022-05-22 ENCOUNTER — Other Ambulatory Visit: Payer: Self-pay | Admitting: Internal Medicine

## 2022-06-21 ENCOUNTER — Other Ambulatory Visit: Payer: Self-pay | Admitting: Internal Medicine

## 2022-06-21 DIAGNOSIS — D509 Iron deficiency anemia, unspecified: Secondary | ICD-10-CM

## 2022-06-26 ENCOUNTER — Encounter: Payer: Self-pay | Admitting: Hematology

## 2022-07-03 ENCOUNTER — Ambulatory Visit (INDEPENDENT_AMBULATORY_CARE_PROVIDER_SITE_OTHER): Payer: Medicaid Other | Admitting: Internal Medicine

## 2022-07-03 ENCOUNTER — Encounter: Payer: Self-pay | Admitting: Internal Medicine

## 2022-07-03 VITALS — BP 124/62 | HR 88 | Ht 68.0 in | Wt 150.4 lb

## 2022-07-03 DIAGNOSIS — K222 Esophageal obstruction: Secondary | ICD-10-CM | POA: Diagnosis not present

## 2022-07-03 DIAGNOSIS — D500 Iron deficiency anemia secondary to blood loss (chronic): Secondary | ICD-10-CM

## 2022-07-03 DIAGNOSIS — K220 Achalasia of cardia: Secondary | ICD-10-CM | POA: Diagnosis not present

## 2022-07-03 DIAGNOSIS — K219 Gastro-esophageal reflux disease without esophagitis: Secondary | ICD-10-CM

## 2022-07-03 MED ORDER — OMEPRAZOLE 40 MG PO CPDR: 40.0000 mg | DELAYED_RELEASE_CAPSULE | Freq: Two times a day (BID) | ORAL | 11 refills | Status: DC

## 2022-07-03 NOTE — Progress Notes (Addendum)
[name] 37 y.o. 1985-06-30 [id number]  Assessment & Plan:   Encounter Diagnoses  Name Primary?   ACHALASIA Yes   GERD with stricture, status post Heller myotomy    Iron deficiency anemia due to chronic blood loss    Overall I think [name] is doing well.  He has only been taking his omeprazole daily and I think it would be best for him to take twice a day given his problems and history of esophageal stricture related to reflux.  He will make that change.  He will continue his metoclopramide daily but may take it up to 3 times a day.  He remains on oral iron supplementation as well.  I am going to request the labs he had at [facility] recently and plan to see him routinely in about 6 months.  CC: [facility] [place]   12/23 Hgb NL Other labs scanned Subjective:   Gastrointestinal problems summary:  Achalasia-diagnosed 2008 and Heller myotomy and Dor fundoplication September 5537 Dr. Abran Richard, redo 2017 suspected associated delayed gastric emptying/gastroparesis question vagal nerve injury treated with metoclopramide  GERD with stricture-developed after achalasia surgery, treated with PPI and dilations, last done 20 mm dilation August 2021  Iron deficiency anemia -question chronic blood loss from GERD, 2017 workup with negative colonoscopy and EGD though subsequent EGDs have shown chronic inflammation.  Followed in hematology with intermittent parenteral iron.     Chief Complaint: Follow-up achalasia and GERD and anemia.  HPI 37 year old African-American man with a history of achalasia status post Heller myotomy and Dor fundoplication and redo as outlined above.  Has issues with chronic GERD and stricture dilated in 2021 and is done well since then for the most part.  Also on Reglan for delayed gastric emptying after last myotomy in 2017.  He has found that he only needs the Reglan once a day lately.  As long as he is compliant with dietary restrictions and  modifications to lifestyle he does pretty well he says.  He had a flare with some regurgitation and vomiting more recently a few weeks or months ago but that resolved.  Overall [name] feels like he is doing well.  He was seen in hematology by Dr. Irene Limbo in August his ferritin was 84 and his hemoglobin was normal then.  He did have some parenteral iron therapy last year.  There are plans for follow-up 6 months after that.  He said he had labs through primary care at [place] last month.  I do not have those results at this time.  He is not noticing any side effects from metoclopramide.  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  Wt Readings from Last 3 Encounters:  07/03/22 150 lb 6 oz (68.2 kg)  02/08/22 159 lb (72.1 kg)  01/12/22 159 lb (72.1 kg)    No Known Allergies Current Meds  Medication Sig   cholecalciferol (VITAMIN D3) 25 MCG (1000 UNIT) tablet Take 1,000 Units by mouth daily.   Cyanocobalamin (VITAMIN B 12 PO) Take 1 Capful by mouth daily.   FLUoxetine (PROZAC) 40 MG capsule Take 40 mg by mouth every morning.   iron polysaccharides (NIFEREX) 150 MG capsule TAKE 1 CAPSULE (150 MG TOTAL) BY MOUTH DAILY.   meloxicam (MOBIC) 15 MG tablet Take 15 mg by mouth daily.   metoCLOPramide (REGLAN) 10 MG tablet TAKE 1 TABLET BY MOUTH THREE TIMES A DAY BEFORE MEALS   Multiple Vitamins-Minerals (CENTRUM ADULTS PO) Take  1 capsule by mouth daily.   omeprazole (PRILOSEC) 40 MG capsule TAKE 1 CAPSULE BY MOUTH TWICE A DAY BEFORE BREAKFAST AND DINNER   oxcarbazepine (TRILEPTAL) 600 MG tablet Take 600 mg by mouth 2 (two) times daily.   PRESCRIPTION MEDICATION ? Name of med for neck pain-  PRN   QUEtiapine (SEROQUEL) 300 MG tablet Take 600 mg by mouth at bedtime.   Past Medical History:  Diagnosis Date   Achalasia of esophagus    Anemia    Blood transfusion without reported diagnosis    Depression    Gastroparesis, suspected, question vagal nerve  injury 03/25/2019   GERD with stricture    Schizoaffective disorder    Seizures ([place])    Last seizure 4-5 yrs ago - caused by Wellbutrin    Past Surgical History:  Procedure Laterality Date   BALLOON DILATION  02/12/2012   Procedure: BALLOON DILATION;  Surgeon: Gatha Mayer, MD;  Location: [facility];  Service: Endoscopy;  Laterality: N/A;   COLONOSCOPY N/A 10/06/2015   Procedure: COLONOSCOPY;  Surgeon: Manus Gunning, MD;  Location: [facility] ENDOSCOPY;  Service: Gastroenterology;  Laterality: N/A;   COLONOSCOPY     ESOPHAGOGASTRODUODENOSCOPY N/A 10/06/2015   Procedure: ESOPHAGOGASTRODUODENOSCOPY (EGD);  Surgeon: Manus Gunning, MD;  Location: [place];  Service: Gastroenterology;  Laterality: N/A;   ESOPHAGOGASTRODUODENOSCOPY (EGD) WITH ESOPHAGEAL DILATION  08/22/12   esophagram  08/31/2006   HELLER MYOTOMY  02/2007 AND 11/2015   with Dor Fundoplication- Dr. Garner [facility]) twice   PANENDOSCOPY  11/08/2006   with submucosal injection   PANENDOSCOPY  03/09/2006   normal   UPPER GASTROINTESTINAL ENDOSCOPY     Social History   Social History Narrative   Single   Lives w/ mom   Employed at [employer]   family history includes Diabetes in his father.   Review of Systems As per HPI  Objective:   Physical Exam BP 124/62   Pulse 88   Ht '5\' 8"'$  (1.727 m)   Wt 150 lb 6 oz (68.2 kg)   BMI 22.86 kg/m  Middle-aged African-American man in no acute distress slightly flat affect but alert and oriented x 3 and appropriate mood otherwise. Lungs clear Heart sounds normal Abdomen soft nontender

## 2022-07-03 NOTE — Patient Instructions (Addendum)
Take your omeprazole  twice a day: before breakfast and before supper.   We will request your lab work from your PCP to review.   I appreciate the opportunity to care for you. Silvano Rusk, MD, [facility]

## 2022-07-21 ENCOUNTER — Other Ambulatory Visit: Payer: Self-pay | Admitting: Internal Medicine

## 2022-08-11 ENCOUNTER — Inpatient Hospital Stay: Payer: Medicaid Other

## 2022-08-11 ENCOUNTER — Inpatient Hospital Stay: Payer: Medicaid Other | Attending: Hematology | Admitting: Hematology

## 2023-01-27 IMAGING — MR MR ABDOMEN WO/W CM
18 series · 48 of 48 positions shown · IV contrast (17 ml multihance)
Comparison: Ultrasound November 03, 2020 and esophagram August 19, 2015

CLINICAL DATA: Further characterization hepatic lesions seen on
prior ultrasound

EXAM:
MRI ABDOMEN WITHOUT AND WITH CONTRAST
TECHNIQUE: Multiplanar multisequence MR imaging of the abdomen was performed
both before and after the administration of intravenous contrast.
CONTRAST:  17mL MULTIHANCE GADOBENATE DIMEGLUMINE 529 MG/ML IV SOLN

[Series 4: T2 · axial · 5.0mm · 1.48mm/px · z∈[-37,+185]mm · 2 of 38 slices shown (1 of 4)]
[im 1/38]
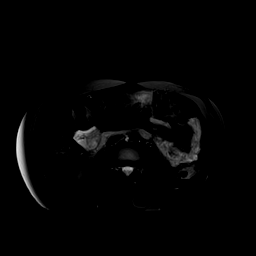
[im 38/38]
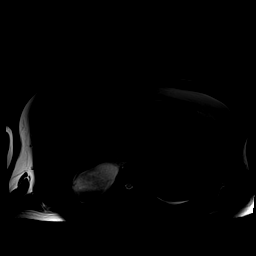

[Series 5: DWI · axial · 5.0mm · 1.42mm/px · z∈[-31,+179]mm · 4 of 108 slices shown (1 of 2)]
[im 1/108]
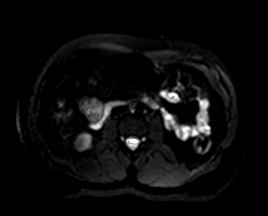
[im 36/108]
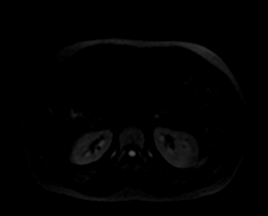
[im 72/108]
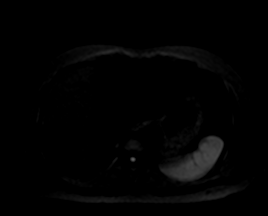
[im 108/108]
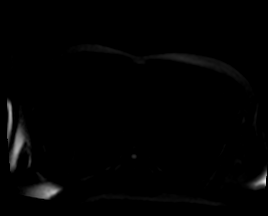

[Series 6: DWI · axial · 5.0mm · 1.42mm/px · 1 of 36 slices shown (2 of 2)]
[im 1/36]
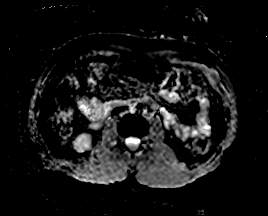

[Series 7: T2 · coronal · 5.0mm · 1.56mm/px · 1 of 36 slices shown (2 of 4)]
[im 1/36]
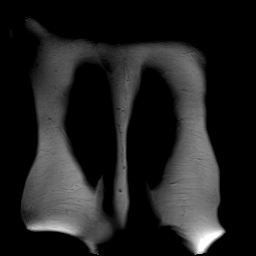

[Series 9: T2 · axial · 6.0mm · 1.19mm/px · 1 of 30 slices shown (3 of 4)]
[im 1/30]
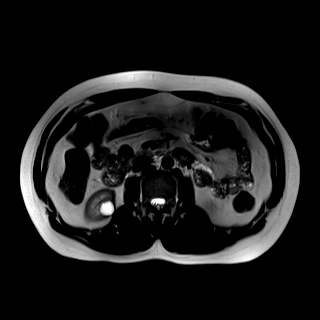

[Series 10: T2 · 1 of 4 slices shown (4 of 4)]
[im 1/4]
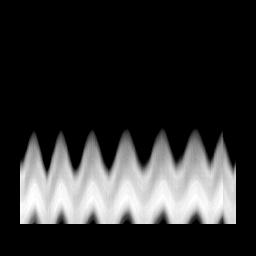

[Series 11: T1 · axial · 3.0mm · 1.19mm/px · z∈[-99,+114]mm · 6 of 144 slices shown]
[im 1/144]
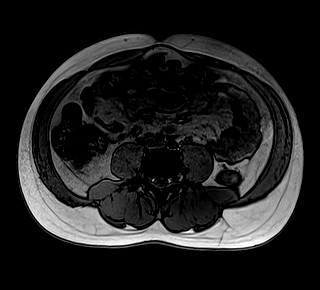
[im 29/144]
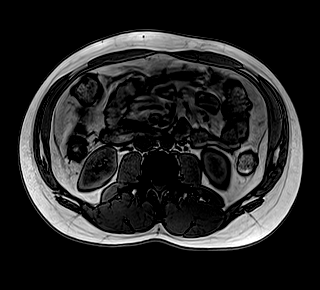
[im 58/144]
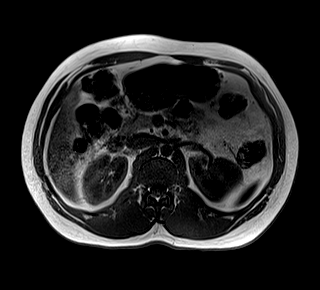
[im 86/144]
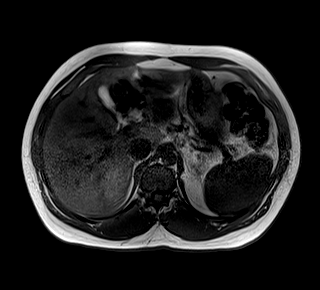
[im 115/144]
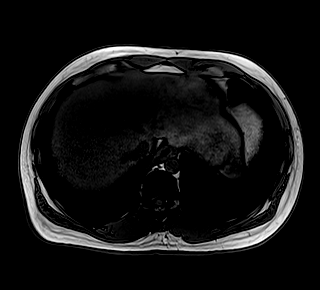
[im 144/144]
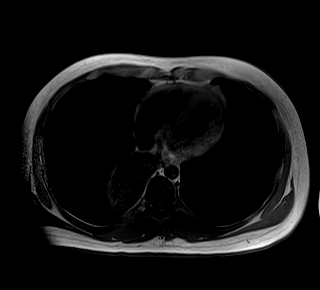

[Series 12: bSSFP · axial · 5.0mm · 1.25mm/px · z∈[-103,+119]mm · 2 of 38 slices shown]
[im 1/38]
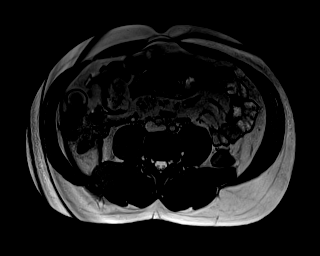
[im 38/38]
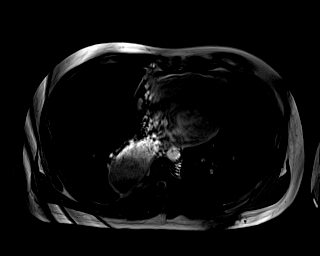

[Series 13: T1 dynamic · axial · non-contrast · 3.0mm · 1.25mm/px · z∈[-100,+113]mm · 3 of 72 slices shown]
[im 1/72]
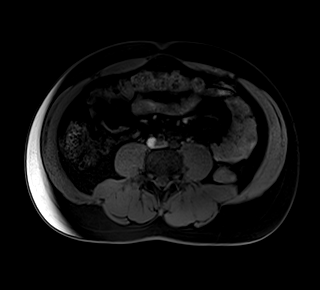
[im 36/72]
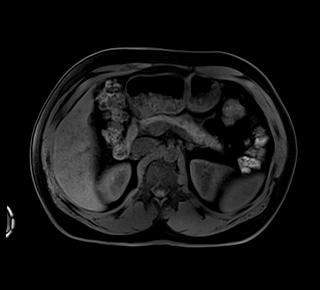
[im 72/72]
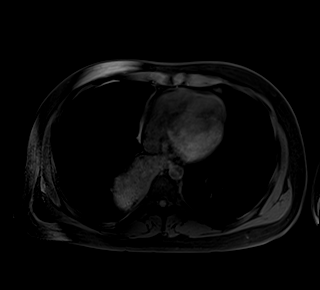

[Series 14: T1 dynamic post-contrast · axial · 3.0mm · 1.25mm/px · z∈[-100,+113]mm · 3 of 72 slices shown (1 of 9)]
[im 1/72]
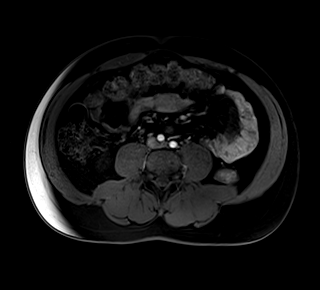
[im 36/72]
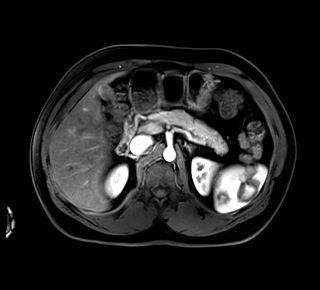
[im 72/72]
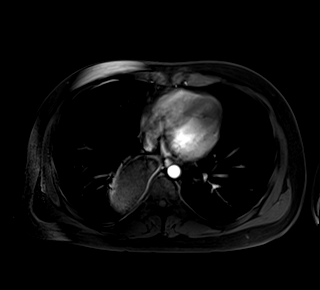

[Series 15: T1 dynamic post-contrast · axial · 3.0mm · 1.25mm/px · z∈[-100,+113]mm · 3 of 72 slices shown (2 of 9)]
[im 1/72]
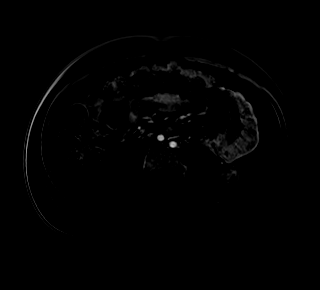
[im 36/72]
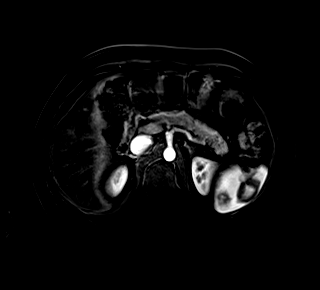
[im 72/72]
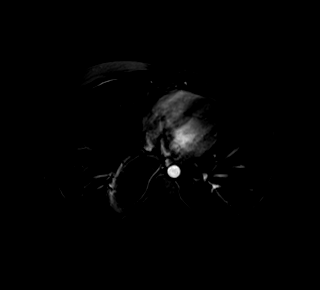

[Series 16: T1 dynamic post-contrast · axial · 3.0mm · 1.25mm/px · z∈[-100,+113]mm · 3 of 72 slices shown (3 of 9)]
[im 1/72]
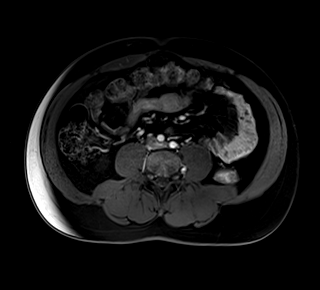
[im 36/72]
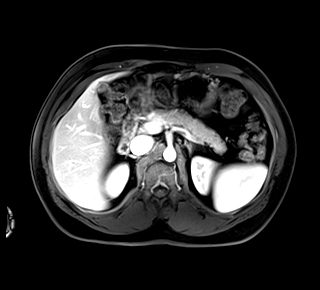
[im 72/72]
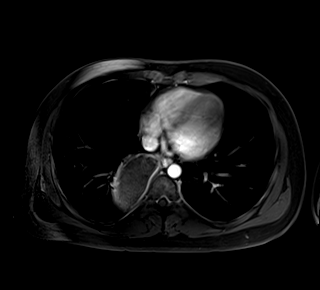

[Series 17: T1 dynamic post-contrast · axial · 3.0mm · 1.25mm/px · z∈[-100,+113]mm · 3 of 72 slices shown (4 of 9)]
[im 1/72]
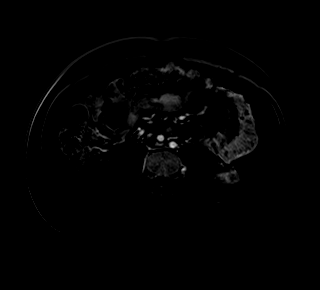
[im 36/72]
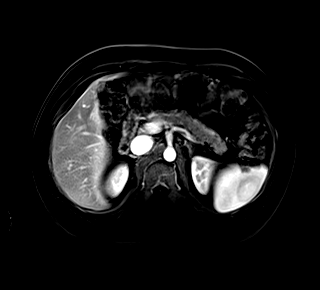
[im 72/72]
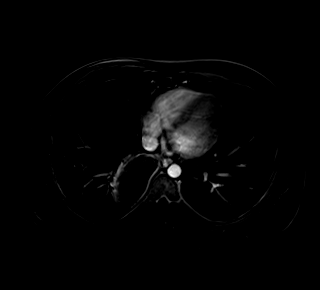

[Series 18: T1 dynamic post-contrast · axial · 3.0mm · 1.25mm/px · z∈[-100,+113]mm · 3 of 72 slices shown (5 of 9)]
[im 1/72]
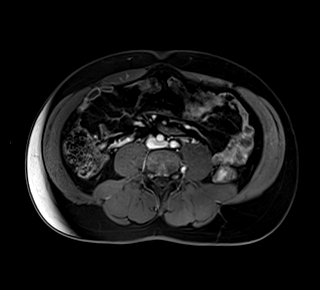
[im 36/72]
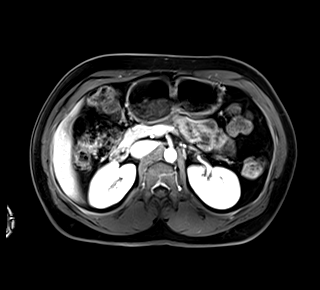
[im 72/72]
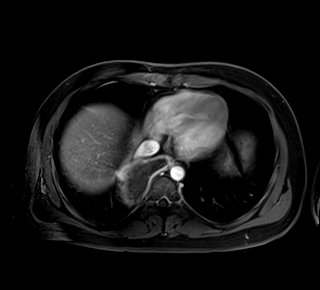

[Series 19: T1 dynamic post-contrast · axial · 3.0mm · 1.25mm/px · z∈[-100,+113]mm · 3 of 72 slices shown (6 of 9)]
[im 1/72]
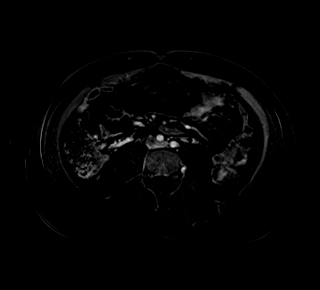
[im 36/72]
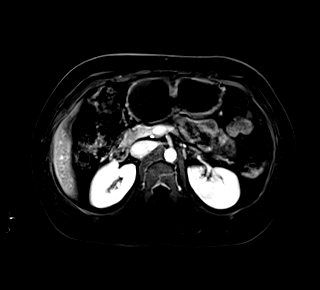
[im 72/72]
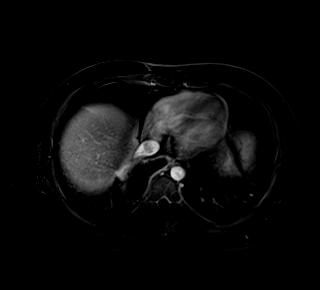

[Series 20: T1 dynamic post-contrast · coronal · 3.0mm · 1.25mm/px · 3 of 72 slices shown (7 of 9)]
[im 1/72]
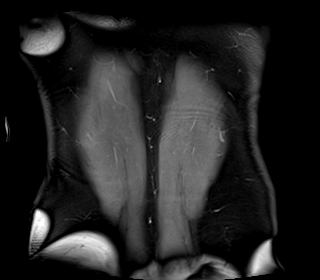
[im 36/72]
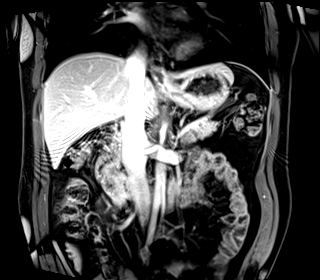
[im 72/72]
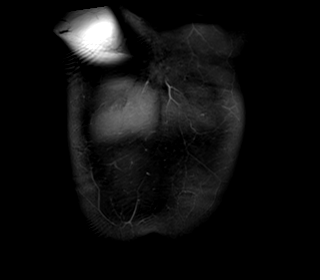

[Series 21: T1 dynamic post-contrast · axial · 3.0mm · 1.25mm/px · z∈[-100,+113]mm · 3 of 72 slices shown (8 of 9)]
[im 1/72]
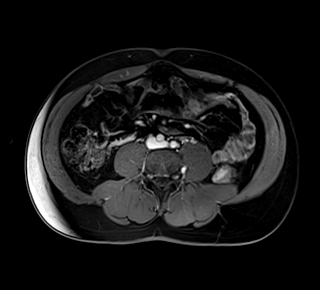
[im 36/72]
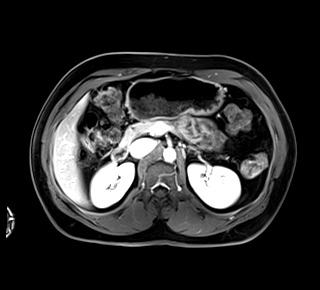
[im 72/72]
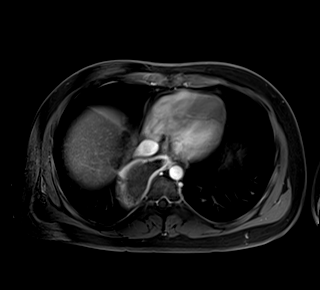

[Series 22: T1 dynamic post-contrast · axial · 3.0mm · 1.25mm/px · z∈[-100,+113]mm · 3 of 72 slices shown (9 of 9)]
[im 1/72]
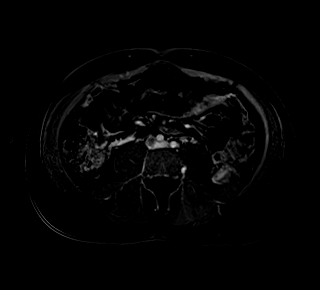
[im 36/72]
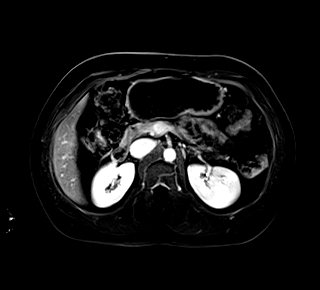
[im 72/72]
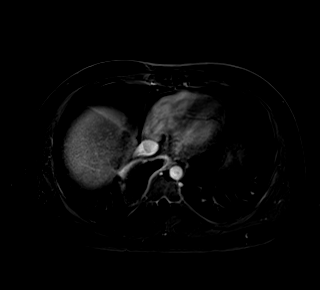

[48 of 48 positions shown; findings below may reference images not displayed]

FINDINGS: Lower chest: Gas fluid level in a dilated esophagus with narrowing
at the GE junction, consistent with patient's known history of
achalasia.

Hepatobiliary: Diffuse loss of parenchymal signal on out of phase
imaging consistent with hepatic steatosis. Areas of relative fatty
sparing in the right lobe of the liver along the porta hepatis as
well as along the falciform ligament and gallbladder fossa. No
suspicious enhancing hepatic lesion.

Gallbladder is unremarkable.  No biliary ductal dilation.

Pancreas: No mass, inflammatory changes, or other parenchymal
abnormality identified.

Spleen:  Within normal limits in size and appearance.

Adrenals/Urinary Tract: Bilateral adrenal glands are unremarkable.
1.8 cm right lower pole renal cyst. No solid enhancing renal
lesions.

Stomach/Bowel: Visualized portions within the abdomen are
unremarkable.

Vascular/Lymphatic: No pathologically enlarged lymph nodes
identified. No abdominal aortic aneurysm demonstrated.

Other:  None.

Musculoskeletal: No suspicious bone lesions identified.
IMPRESSION: 1. Hepatic steatosis with areas of focal fatty sparing, which likely
correspond with the finding on prior ultrasound. No suspicious
hepatic lesion.
2. Gas fluid level in a dilated esophagus with narrowing at the GE
junction, consistent with patient's known history of achalasia.

## 2023-04-20 ENCOUNTER — Other Ambulatory Visit: Payer: Self-pay | Admitting: Internal Medicine

## 2023-04-20 NOTE — Telephone Encounter (Signed)
He booked a January appointment and request I put him on the cancellation list. He said he is doing better but that it is hard to do what you need to sometimes with eating.

## 2023-04-20 NOTE — Telephone Encounter (Signed)
I refilled it.  Please have Eliel make a next available follow-up.

## 2023-04-20 NOTE — Telephone Encounter (Signed)
Please advise Sir, thank you. 

## 2023-04-23 ENCOUNTER — Other Ambulatory Visit: Payer: Self-pay | Admitting: Internal Medicine

## 2023-07-02 ENCOUNTER — Other Ambulatory Visit: Payer: Self-pay | Admitting: Internal Medicine

## 2023-07-02 DIAGNOSIS — D509 Iron deficiency anemia, unspecified: Secondary | ICD-10-CM

## 2023-07-03 LAB — LAB REPORT - SCANNED
A1c: 5.7
EGFR: 94
Free T4: 0.8 ng/dL
HM HIV Screening: NEGATIVE
TSH: 0.58 (ref 0.41–5.90)

## 2023-07-04 ENCOUNTER — Encounter: Payer: Self-pay | Admitting: Hematology

## 2023-07-11 ENCOUNTER — Ambulatory Visit: Payer: MEDICAID | Admitting: Internal Medicine

## 2023-08-02 ENCOUNTER — Other Ambulatory Visit: Payer: Self-pay | Admitting: Internal Medicine

## 2023-08-31 ENCOUNTER — Other Ambulatory Visit: Payer: Self-pay | Admitting: Internal Medicine

## 2023-08-31 DIAGNOSIS — D509 Iron deficiency anemia, unspecified: Secondary | ICD-10-CM

## 2023-09-28 ENCOUNTER — Other Ambulatory Visit: Payer: Self-pay | Admitting: Internal Medicine

## 2023-09-28 DIAGNOSIS — D509 Iron deficiency anemia, unspecified: Secondary | ICD-10-CM

## 2023-10-15 ENCOUNTER — Other Ambulatory Visit: Payer: Self-pay | Admitting: Internal Medicine

## 2023-10-22 ENCOUNTER — Encounter: Payer: Self-pay | Admitting: Internal Medicine

## 2023-10-22 ENCOUNTER — Ambulatory Visit (INDEPENDENT_AMBULATORY_CARE_PROVIDER_SITE_OTHER): Payer: MEDICAID | Admitting: Internal Medicine

## 2023-10-22 VITALS — BP 124/68 | HR 86 | Ht 68.0 in | Wt 142.0 lb

## 2023-10-22 DIAGNOSIS — K220 Achalasia of cardia: Secondary | ICD-10-CM

## 2023-10-22 DIAGNOSIS — D500 Iron deficiency anemia secondary to blood loss (chronic): Secondary | ICD-10-CM | POA: Diagnosis not present

## 2023-10-22 DIAGNOSIS — K3184 Gastroparesis: Secondary | ICD-10-CM | POA: Diagnosis not present

## 2023-10-22 DIAGNOSIS — K219 Gastro-esophageal reflux disease without esophagitis: Secondary | ICD-10-CM | POA: Diagnosis not present

## 2023-10-22 DIAGNOSIS — K222 Esophageal obstruction: Secondary | ICD-10-CM

## 2023-10-22 DIAGNOSIS — R634 Abnormal weight loss: Secondary | ICD-10-CM

## 2023-10-22 NOTE — Patient Instructions (Addendum)
 It has been recommended to you by your physician that you have a(n) EGD completed. Per your request, we did not schedule the procedure(s) today. Please contact our office at [phone] should you decide to have the procedure completed.   We are going to obtain your lab results from [place] on [address] To review.  I appreciate the opportunity to care for you. Loy Ruff, MD, [facility]

## 2023-10-22 NOTE — Addendum Note (Signed)
 Addended by: Arlone Lenhardt E on: 10/22/2023 05:16 PM   Modules accepted: Orders

## 2023-10-22 NOTE — Progress Notes (Addendum)
 [name] 38 y.o. 27-Oct-1985 [id number]  Assessment & Plan:   Encounter Diagnoses  Name Primary?   GERD with stricture, status post Heller myotomy    ACHALASIA Yes   Iron  deficiency anemia due to chronic blood loss    Gastroparesis, suspected, question vagal nerve injury    Loss of weight    Evaluate flare in symptoms with EGD and possible esophageal dilation.  He has responded some to dilation of GE junction stricture status post Heller myotomy.  He will take his omeprazole  twice a day regularly.  He did express frustration about the need for chronic medical therapy.   I thought Reglan  had helped him in the past but I think we should try to avoid using that if he is on Prolixin at this time.  There will be a significantly increased risk of extraparametal symptoms and tardive dyskinesia with that.  Clear liquids 24 hours prior to the EGD to increase the chance that the esophagus is clear at the time of EGD.  The risks and benefits as well as alternatives of endoscopic procedure(s) have been discussed and reviewed. All questions answered. The patient agrees to proceed.  We will request labs he said were done at [facility] within the last month or 2.  CC: [facility] 07/02/2023  CMET NL Hgb 14.2 MCV 85 WBC 4 PLT 417   Subjective:  Gastrointestinal problems summary:   Achalasia-diagnosed 2008 and Heller myotomy and Dor fundoplication September 2008 Dr. Yancey Helena, redo 2017 suspected associated delayed gastric emptying/gastroparesis question vagal nerve injury treated with metoclopramide    GERD with stricture-developed after achalasia surgery, treated with PPI and dilations, last done 20 mm dilation August 2021   Iron  deficiency anemia -question chronic blood loss from GERD, 2017 workup with negative colonoscopy and EGD though subsequent EGDs have shown chronic inflammation.  Followed in hematology with intermittent parenteral iron .     ----------------------------------------------------------------------------------------------------------------   Chief Complaint: Reflux, achalasia  HPI 38 year old man with a history of achalasia status post Heller myotomy and Dor fundoplication and redo as outlined above. Has issues with chronic GERD and stricture dilated in 2021 and is done well since then for the most part.    [name] reports increasing regurgitation and self-induced vomiting lately.  It sounds like he was only taking his omeprazole  once a day at times.  He says very often it feels like there is a pressure in the lower chest area,  and he will induce vomiting to relieve it.   I have not seen him in some time (since 06/2022) and he has not been on his Reglan .  In the interim Prolixin was started for his schizoaffective disorder.  He says he had labs at [facility] on [address] in the last month or so.  He says they are keeping up with his blood counts.  He remains on his iron  therapy.     Wt Readings from Last 3 Encounters:  10/22/23 142 lb (64.4 kg)  07/03/22 150 lb 6 oz (68.2 kg)  02/08/22 159 lb (72.1 kg)     No Known Allergies Current Meds  Medication Sig   cholecalciferol (VITAMIN D3) 25 MCG (1000 UNIT) tablet Take 1,000 Units by mouth daily.   FLUoxetine (PROZAC) 40 MG capsule Take 40 mg by mouth every morning.   fluPHENAZine (PROLIXIN) 10 MG tablet Take 10 mg by mouth daily.   iron  polysaccharides (NIFEREX) 150 MG capsule Take 1 capsule (150 mg total) by mouth daily.  meloxicam (MOBIC) 15 MG tablet Take 15 mg by mouth daily.   omeprazole  (PRILOSEC) 40 MG capsule TAKE 1 CAPSULE BY MOUTH TWICE A DAY BEFORE A MEAL (BREAKFAST AND SUPPER)   oxcarbazepine (TRILEPTAL) 600 MG tablet Take 600 mg by mouth 2 (two) times daily.   QUEtiapine  (SEROQUEL ) 300 MG tablet Take 600 mg by mouth at bedtime.   [DISCONTINUED] Cyanocobalamin (VITAMIN B 12 PO) Take 1 Capful by mouth daily.   [DISCONTINUED] metoCLOPramide   (REGLAN ) 10 MG tablet TAKE 1 TABLET BY MOUTH THREE TIMES A DAY BEFORE MEALS   [DISCONTINUED] PRESCRIPTION MEDICATION ? Name of med for neck pain-  PRN   Past Medical History:  Diagnosis Date   Achalasia of esophagus    Anemia    Blood transfusion without reported diagnosis    Depression    Gastroparesis, suspected, question vagal nerve injury 03/25/2019   GERD with stricture    Schizoaffective disorder    Seizures (HCC)    Last seizure 4-5 yrs ago - caused by Wellbutrin    Past Surgical History:  Procedure Laterality Date   BALLOON DILATION  02/12/2012   Procedure: BALLOON DILATION;  Surgeon: Kenney Peacemaker, MD;  Location: [facility] ENDOSCOPY;  Service: Endoscopy;  Laterality: N/A;   COLONOSCOPY N/A 10/06/2015   Procedure: COLONOSCOPY;  Surgeon: Danette Duos, MD;  Location: [facility] ENDOSCOPY;  Service: Gastroenterology;  Laterality: N/A;   COLONOSCOPY     ESOPHAGOGASTRODUODENOSCOPY N/A 10/06/2015   Procedure: ESOPHAGOGASTRODUODENOSCOPY (EGD);  Surgeon: Danette Duos, MD;  Location: [facility] ENDOSCOPY;  Service: Gastroenterology;  Laterality: N/A;   ESOPHAGOGASTRODUODENOSCOPY (EGD) WITH ESOPHAGEAL DILATION  08/22/12   esophagram  08/31/2006   HELLER MYOTOMY  02/2007 AND 11/2015   with Dor Fundoplication- Dr. 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   Review of Systems See HPI  Objective:   Physical Exam @BP  124/68   Pulse 86   Ht 5\' 8"  (1.727 m)   Wt 142 lb (64.4 kg)   BMI 21.59 kg/m @  General:  NAD Eyes:   anicteric Lungs:  clear Heart::  S1S2 no rubs, murmurs or gallops Abdomen:  soft and nontender, BS+ Ext:   no edema, cyanosis or clubbing I do not see any  signs of tardive dyskinesia. The patient is alert and cooperative.  He does have a flat affect which is not new.   Data Reviewed:  See HPI and summary

## 2023-10-22 NOTE — Progress Notes (Deleted)
 [name] [id number] [birth year]   Chief Complaint: Iron  deficiency anemia, achalasia, GERD  Referring Provider: [facility], [facility] Primary GI MD: Dr. Willy Harvest  Gastrointestinal problems summary:   Achalasia-diagnosed 2008 and Heller myotomy and [facility] fundoplication September 2008 Dr. Yancey Helena, redo 2017 suspected associated delayed gastric emptying/gastroparesis question vagal nerve injury treated with metoclopramide    GERD with stricture-developed after achalasia surgery, treated with PPI and dilations, last done 20 mm dilation August 2021   Iron  deficiency anemia -question chronic blood loss from GERD, 2017 workup with negative colonoscopy and EGD though subsequent EGDs have shown chronic inflammation.  Followed in hematology with intermittent parenteral iron .     HPI: [name] is a 38 y.o. male with past medical history of GERD w/ stricture and h/o dilations, achalasia s/p Heller myotomy and Dor fundoplication, IDA due to chronic blood loss from GERD, who presents today for a complaint of *** .      Previous GI Procedures   EGD 02/06/2020 - Dilation in the entire esophagus. No motility seen - achalasia  - Food in the lower third of the esophagus. moderate - corn seen - some was advanced into stomach with scope  - Benign- appearing esophageal stenosis. Dilated. 20 mm  - Moderately severe esophagitis. stasis type  - A small amount of food ( residue) in the stomach. No significant gastric motility seen  - Normal examined duodenum. gastric retroflexion without other abnormality  - No specimens collected.  Colonoscopy 10/06/2015 (inpatient, for IDA) - Non- bleeding internal hemorrhoids. - The examined portion of the ileum was normal.  - The examination was otherwise normal.  - Overall, no pathology noted to cause iron  deficiency on colonoscopy.   Past Medical History:  Diagnosis Date   Achalasia of esophagus    Anemia    Blood transfusion without reported  diagnosis    Depression    Gastroparesis, suspected, question vagal nerve injury 03/25/2019   GERD with stricture    Schizoaffective disorder    Seizures (HCC)    Last seizure 4-5 yrs ago - caused by Wellbutrin     Past Surgical History:  Procedure Laterality Date   BALLOON DILATION  02/12/2012   Procedure: BALLOON DILATION;  Surgeon: Kenney Peacemaker, MD;  Location: [facility] ENDOSCOPY;  Service: Endoscopy;  Laterality: N/A;   COLONOSCOPY N/A 10/06/2015   Procedure: COLONOSCOPY;  Surgeon: Danette Duos, MD;  Location: [facility] ENDOSCOPY;  Service: Gastroenterology;  Laterality: N/A;   COLONOSCOPY     ESOPHAGOGASTRODUODENOSCOPY N/A 10/06/2015   Procedure: ESOPHAGOGASTRODUODENOSCOPY (EGD);  Surgeon: Danette Duos, MD;  Location: [facility] ENDOSCOPY;  Service: Gastroenterology;  Laterality: N/A;   ESOPHAGOGASTRODUODENOSCOPY (EGD) WITH ESOPHAGEAL DILATION  08/22/12   esophagram  08/31/2006   HELLER MYOTOMY  02/2007 AND 11/2015   with Dor Fundoplication- Dr. Elner Hahn [facility]) twice   PANENDOSCOPY  11/08/2006   with submucosal injection   PANENDOSCOPY  03/09/2006   normal   UPPER GASTROINTESTINAL ENDOSCOPY      Current Outpatient Medications  Medication Sig Dispense Refill   Ascorbic Acid (VITAMIN C) 100 MG tablet Take 100 mg by mouth daily. (Patient not taking: Reported on 07/03/2022)     cholecalciferol (VITAMIN D3) 25 MCG (1000 UNIT) tablet Take 1,000 Units by mouth daily.     Cyanocobalamin (VITAMIN B 12 PO) Take 1 Capful by mouth daily.     FLUoxetine (PROZAC) 40 MG capsule Take 40 mg by mouth every morning.     fluPHENAZine (PROLIXIN) 10 MG tablet Take 10 mg by  mouth daily. (Patient not taking: Reported on 07/03/2022)     iron  polysaccharides (NIFEREX) 150 MG capsule Take 1 capsule (150 mg total) by mouth daily. 30 capsule 1   meloxicam (MOBIC) 15 MG tablet Take 15 mg by mouth daily.     metoCLOPramide  (REGLAN ) 10 MG tablet TAKE 1 TABLET BY MOUTH THREE TIMES A DAY BEFORE MEALS 90 tablet 1    Multiple Vitamins-Minerals (CENTRUM ADULTS PO) Take 1 capsule by mouth daily.     omeprazole  (PRILOSEC) 40 MG capsule TAKE 1 CAPSULE BY MOUTH TWICE A DAY BEFORE A MEAL (BREAKFAST AND SUPPER) 60 capsule 1   oxcarbazepine (TRILEPTAL) 600 MG tablet Take 600 mg by mouth 2 (two) times daily.     PRESCRIPTION MEDICATION ? Name of med for neck pain-  PRN     QUEtiapine  (SEROQUEL ) 300 MG tablet Take 600 mg by mouth at bedtime.  1   No current facility-administered medications for this visit.    Allergies as of 10/22/2023   (No Known Allergies)    Family History  Problem Relation Age of Onset   Diabetes Father    Colon cancer Neg Hx    Stomach cancer Neg Hx    Pancreatic cancer Neg Hx    Rectal cancer Neg Hx    Heart disease Neg Hx    Colon polyps Neg Hx     Social History   Tobacco Use   Smoking status: Former    Current packs/day: 0.50    Average packs/day: 0.5 packs/day for 2.0 years (1.0 ttl pk-yrs)    Types: E-cigarettes, Cigarettes   Smokeless tobacco: Never   Tobacco comments:    given counseling sheet 01-26-12, pt started e-sig  Vaping Use   Vaping status: Every Day   Start date: 10/24/2013   Substances: Nicotine  Substance Use Topics   Alcohol use: No   Drug use: No     Review of Systems:    Constitutional: No weight loss, fever, chills, weakness or fatigue HEENT: Eyes: No change in vision Ears, Nose, Throat:  No change in hearing or congestion Skin: No rash or itching Cardiovascular: No chest pain, chest pressure or palpitations   Respiratory: No SOB or cough Gastrointestinal: See HPI and otherwise negative Genitourinary: No dysuria or change in urinary frequency Neurological: No headache, dizziness or syncope Musculoskeletal: No new muscle or joint pain Hematologic: No bleeding or bruising Psychiatric: No history of depression or anxiety    Physical Exam:  Vital signs: There were no vitals taken for this visit.  Constitutional: NAD, Well developed, Well  nourished, alert and cooperative Head:  Normocephalic and atraumatic. Eyes:  EOMs intact. No scleral icterus. Conjunctiva pink. Respiratory: Respirations even and unlabored. Lungs clear to auscultation bilaterally.  No wheezes, crackles, or rhonchi.  Cardiovascular:  Regular rate and rhythm. No peripheral edema, cyanosis or pallor.  Gastrointestinal:  Soft, nondistended, nontender. No rebound or guarding. Normal bowel sounds. No appreciable masses or hepatomegaly. Rectal:  Not performed.  Msk:  Symmetrical without gross deformities. Without edema, no deformity or joint abnormality.  Neurologic:  Alert and oriented x4;  grossly normal neurologically.  Skin:   Dry and intact without significant lesions or rashes. Psychiatric: Oriented to person, place and time. Demonstrates good judgement and reason without abnormal affect or behaviors.   RELEVANT LABS AND IMAGING: CBC    Component Value Date/Time   WBC 7.2 02/08/2022 1145   WBC 7.8 09/26/2021 1126   RBC 4.94 02/08/2022 1145   HGB 14.6 02/08/2022  1145   HGB 15.1 03/20/2017 1425   HCT 40.6 02/08/2022 1145   HCT 46.2 03/20/2017 1425   PLT 296 02/08/2022 1145   PLT 319 03/20/2017 1425   MCV 82.2 02/08/2022 1145   MCV 85 03/20/2017 1425   MCH 29.6 02/08/2022 1145   MCHC 36.0 02/08/2022 1145   RDW 15.3 02/08/2022 1145   RDW 15.5 (H) 03/20/2017 1425   LYMPHSABS 0.9 02/08/2022 1145   MONOABS 0.6 02/08/2022 1145   EOSABS 0.1 02/08/2022 1145   BASOSABS 0.0 02/08/2022 1145    CMP     Component Value Date/Time   NA 135 02/08/2022 1145   NA 143 05/10/2017 1640   K 3.7 02/08/2022 1145   CL 106 02/08/2022 1145   CO2 27 02/08/2022 1145   GLUCOSE 102 (H) 02/08/2022 1145   BUN <5 (L) 02/08/2022 1145   BUN 7 05/10/2017 1640   CREATININE 0.79 02/08/2022 1145   CREATININE 1.25 08/04/2016 1647   CALCIUM 9.6 02/08/2022 1145   PROT 7.2 02/08/2022 1145   PROT 7.0 03/20/2017 1425   ALBUMIN 4.4 02/08/2022 1145   ALBUMIN 4.4 03/20/2017 1425    AST 14 (L) 02/08/2022 1145   ALT 14 02/08/2022 1145   ALKPHOS 73 02/08/2022 1145   BILITOT 0.3 02/08/2022 1145   GFRNONAA >60 02/08/2022 1145   GFRNONAA 76 08/04/2016 1647   GFRAA 107 05/10/2017 1640   GFRAA 88 08/04/2016 1647     Assessment/Plan:   Iron  deficiency anemia due to chronic blood loss Updated labs? Continue iron  supplement, vitamin C  GERD On Omeprazole  40mg  BID  Achalasia  On metaclopramide (Reglan )   Alexander Gaul, PA-C [facility] 10/22/2023, 1:02 PM  Patient Care Team: [facility], [facility] as PCP - General

## 2023-10-23 ENCOUNTER — Encounter: Payer: MEDICAID | Admitting: Internal Medicine

## 2023-10-26 ENCOUNTER — Telehealth: Payer: Self-pay | Admitting: Internal Medicine

## 2023-10-26 NOTE — Telephone Encounter (Signed)
 PT just picked up refill for Vitamin D and he accidentally threw it away while cleaning care. He would like to request a new script be sent. Please advise.

## 2023-10-26 NOTE — Telephone Encounter (Signed)
 I spoke with [name] and he went by and signed a ROI at his PCP so we can get his 2025 labs faxed over. I told him the Vitamin D3 1000 units- 25mcg is over the counter. He said he didn't mean to call us  about that it was another Dr that rx's his vitamin D.

## 2023-11-05 NOTE — Telephone Encounter (Signed)
 Inbound call from patient cancelling upcoming procedure . States he previously had symptoms of vomiting. Symptoms are mild still. Patient wanting to make us  aware.   Thank you he will call back to reschedule at a later time.

## 2023-11-05 NOTE — Telephone Encounter (Signed)
FYI Dr. Gessner 

## 2023-11-09 ENCOUNTER — Encounter: Payer: MEDICAID | Admitting: Internal Medicine

## 2023-11-16 ENCOUNTER — Other Ambulatory Visit: Payer: Self-pay | Admitting: Internal Medicine

## 2023-11-16 DIAGNOSIS — D509 Iron deficiency anemia, unspecified: Secondary | ICD-10-CM

## 2023-11-16 NOTE — Telephone Encounter (Signed)
 Patient was suppose to call back for an EGD  Needs to contact the office

## 2023-12-25 ENCOUNTER — Telehealth: Payer: Self-pay

## 2023-12-25 ENCOUNTER — Telehealth: Payer: Self-pay | Admitting: Internal Medicine

## 2023-12-25 ENCOUNTER — Encounter: Payer: Self-pay | Admitting: Hematology

## 2023-12-25 ENCOUNTER — Other Ambulatory Visit: Payer: Self-pay | Admitting: Internal Medicine

## 2023-12-25 ENCOUNTER — Other Ambulatory Visit (HOSPITAL_COMMUNITY): Payer: Self-pay

## 2023-12-25 DIAGNOSIS — D509 Iron deficiency anemia, unspecified: Secondary | ICD-10-CM

## 2023-12-25 NOTE — Telephone Encounter (Signed)
 Inbound call from patient, states iron  pills and omperazole require prior authorizations, patient requesting for prior authorization to be submitted.

## 2023-12-25 NOTE — Telephone Encounter (Signed)
 [name] wanted Dr Avram to know that he is eating a certain brand of sour kraut from fresh market that is helping his GERD. I will pass this on.

## 2023-12-25 NOTE — Telephone Encounter (Signed)
 I called and told [name] that our PA team has worked on this. He can pick up the omeprazole  July 12th and the iron  is a plan exclusion. He is asking about a refill on a medicine for acid reflux that he takes three times a day. I told him that at his visit in April Dr Avram said not to take the reglan  while he is on the prolixin as he could have unwanted side effects of Tarka Dyskinsia. His phone was going in and out so I told him to call back if he has further questions.

## 2023-12-25 NOTE — Telephone Encounter (Signed)
 Noted

## 2023-12-25 NOTE — Telephone Encounter (Signed)
 Pharmacy Patient Advocate Encounter   Received notification from Pt Calls Messages that prior authorization for omeprazole  (PRILOSEC) 40 MG capsule is required/requested.   Insurance verification completed.   The patient is insured through [employer] [place] [employer] [place] .   Per test claim: Refill too soon. PA is not needed at this time. Medication was filled 12-14-2023. Next eligible fill date is 01-05-2024.

## 2023-12-25 NOTE — Telephone Encounter (Signed)
 Pharmacy Patient Advocate Encounter   Received notification from Pt Calls Messages that prior authorization for iron  polysaccharides (NIFEREX) 150 MG capsule is required/requested.   Insurance verification completed.   The patient is insured through [place] .   Per test claim: This is a plan exclusion and not covered under pharmacy benefits.

## 2023-12-26 ENCOUNTER — Telehealth: Payer: Self-pay

## 2023-12-26 NOTE — Telephone Encounter (Signed)
 [name] came in and we updated his medicine list and went back over our conversation from last night. His phone was in and out. I told him that Dr Avram said to purchase OTC iron  since his insurance doesn't want to cover it.   He wants to know when to come back and see us . I will call him back after Dr Avram lets me know. He was seen in late April 2025. He didn't want to do the EGD and he still doesn't want to set it up since the sour kraut he's eating is helping his GERD.

## 2023-12-26 NOTE — Telephone Encounter (Signed)
 September 17th appointment set up with [facility].

## 2024-01-14 NOTE — Telephone Encounter (Signed)
 I called the patient back and no answer, No Voicemail set up

## 2024-01-14 NOTE — Telephone Encounter (Signed)
 Inbound call from patient in regards to omeprazole  medication.

## 2024-01-15 MED ORDER — OMEPRAZOLE 40 MG PO CPDR: 40.0000 mg | DELAYED_RELEASE_CAPSULE | Freq: Two times a day (BID) | ORAL | 5 refills | Status: DC

## 2024-01-15 NOTE — Addendum Note (Signed)
 Addended by: Macallan Ord E on: 01/15/2024 01:19 PM   Modules accepted: Orders

## 2024-01-15 NOTE — Telephone Encounter (Signed)
 Patient returning call.

## 2024-01-15 NOTE — Telephone Encounter (Signed)
 [address] [facility] to refill prescription.

## 2024-01-15 NOTE — Telephone Encounter (Signed)
 I spoke with [name] and [facility]. Sent in his omeprazole  refill.

## 2024-01-15 NOTE — Telephone Encounter (Signed)
 Patient called and stated that the address he provided was incorrect. The address should be [address] [facility]

## 2024-02-19 ENCOUNTER — Telehealth: Payer: Self-pay | Admitting: Internal Medicine

## 2024-02-19 NOTE — Telephone Encounter (Signed)
 I spoke with [name] and told him back in July 2025 Dr Avram had told him not to take the metoclopramide  with the Prolixin he is on due to those drugs together could cause unwanted side effects of Tarka Dyskinsia. He just wanted to double check on the name of the medicine and we confirmed his upcoming appointment in September.

## 2024-02-19 NOTE — Telephone Encounter (Signed)
 Inbound call from patient requesting a call from PJ. Patient states he has questions regarding what medication Dr. Avram wanted him to stp taking. Please advise.

## 2024-03-05 ENCOUNTER — Emergency Department (HOSPITAL_BASED_OUTPATIENT_CLINIC_OR_DEPARTMENT_OTHER)
Admission: EM | Admit: 2024-03-05 | Discharge: 2024-03-05 | Disposition: A | Discharge status: Home / Self Care | Payer: MEDICAID | Attending: Emergency Medicine | Admitting: Emergency Medicine

## 2024-03-05 ENCOUNTER — Encounter (HOSPITAL_BASED_OUTPATIENT_CLINIC_OR_DEPARTMENT_OTHER): Payer: Self-pay | Admitting: Emergency Medicine

## 2024-03-05 ENCOUNTER — Other Ambulatory Visit: Payer: Self-pay

## 2024-03-05 DIAGNOSIS — R21 Rash and other nonspecific skin eruption: Secondary | ICD-10-CM | POA: Diagnosis present

## 2024-03-05 DIAGNOSIS — L243 Irritant contact dermatitis due to cosmetics: Secondary | ICD-10-CM | POA: Diagnosis not present

## 2024-03-05 MED ORDER — PREDNISONE 50 MG PO TABS
50.0000 mg | ORAL_TABLET | Freq: Once | ORAL | Status: AC
  Administered 2024-03-05: 50 mg via ORAL
  Filled 2024-03-05: qty 1

## 2024-03-05 MED ORDER — PREDNISONE 10 MG PO TABS: ORAL_TABLET | ORAL | 0 refills | Status: AC

## 2024-03-05 MED ORDER — DIPHENHYDRAMINE HCL 25 MG PO TABS: 25.0000 mg | ORAL_TABLET | Freq: Four times a day (QID) | ORAL | 0 refills | Status: AC | PRN

## 2024-03-05 MED ORDER — DIPHENHYDRAMINE HCL 25 MG PO CAPS
25.0000 mg | ORAL_CAPSULE | Freq: Once | ORAL | Status: AC
  Administered 2024-03-05: 25 mg via ORAL
  Filled 2024-03-05: qty 1

## 2024-03-05 NOTE — Discharge Instructions (Signed)
 Take the antihistamines and steroids to help with the inflammation and irritation.  You can also take over-the-counter medications for pain such as this Tylenol  ibuprofen.  The swelling should improve over the next week or so

## 2024-03-05 NOTE — ED Notes (Signed)
 Reviewed AVS/discharge instruction with patient. Time allotted for and all questions answered. Patient is agreeable for d/c and escorted to ed exit by staff.

## 2024-03-05 NOTE — ED Triage Notes (Signed)
 Pt caox4, ambulatory c/o rash, swelling and pain to chin stating he had the reaction approx 30 min after putting a hair dye on chin which he applied approx 6 hrs ago.

## 2024-03-05 NOTE — ED Provider Notes (Signed)
 [place] EMERGENCY DEPARTMENT AT [facility] Provider Note   CSN: [id number] Arrival date & time: 03/05/24  1630     Patient presents with: No chief complaint on file.   [name] is a 38 y.o. male.   HPI   The patient has a history of schizoaffective disorder acid reflux gastroparesis.  Patient states he was applying hair dye to his beard today.  About 30 minutes after applying that patient states he started developing irritation redness burning of his chin.  Patient states his chin is now very swollen.  He is not having difficulty breathing.  No difficulty swallowing  Prior to Admission medications   Medication Sig Start Date End Date Taking? Authorizing Provider  diphenhydrAMINE  (BENADRYL ) 25 MG tablet Take 1 tablet (25 mg total) by mouth every 6 (six) hours as needed. 03/05/24  Yes Randol Simmonds, MD  predniSONE  (DELTASONE ) 10 MG tablet Take 5 tablets (50 mg total) by mouth daily with breakfast for 2 days, THEN 4 tablets (40 mg total) daily with breakfast for 2 days, THEN 3 tablets (30 mg total) daily with breakfast for 2 days, THEN 2 tablets (20 mg total) daily with breakfast for 2 days, THEN 1 tablet (10 mg total) daily with breakfast for 2 days. 03/05/24 03/15/24 Yes Randol Simmonds, MD  cholecalciferol (VITAMIN D3) 25 MCG (1000 UNIT) tablet Take 1,000 Units by mouth daily.    [provider]  FLUoxetine (PROZAC) 40 MG capsule Take 40 mg by mouth every morning. 07/21/20   [provider]  fluPHENAZine (PROLIXIN) 1 MG tablet Take 1 mg by mouth daily.    [provider]  iron  polysaccharides (NIFEREX) 150 MG capsule Take 1 capsule (150 mg total) by mouth daily. 12/25/23   Avram Lupita BRAVO, MD  omeprazole  (PRILOSEC) 40 MG capsule Take 1 capsule (40 mg total) by mouth 2 (two) times daily. 01/15/24   Avram Lupita BRAVO, MD  Oxcarbazepine (TRILEPTAL) 300 MG tablet Take 300 mg by mouth 2 (two) times daily.    [provider]  QUEtiapine  (SEROQUEL ) 400 MG  tablet Take 400 mg by mouth 2 (two) times daily.    [provider]    Allergies: Patient has no known allergies.    Review of Systems  Updated Vital Signs BP (!) 157/83   Pulse (!) 107   Temp 100.2 F (37.9 C) (Oral)   Resp 20   Ht 1.727 m (5' 8)   Wt 72.6 kg   SpO2 98%   BMI 24.33 kg/m   Physical Exam Vitals and nursing note reviewed.  Constitutional:      General: He is not in acute distress.    Appearance: He is well-developed.  HENT:     Head: Normocephalic and atraumatic.     Right Ear: External ear normal.     Left Ear: External ear normal.     Mouth/Throat:     Comments: No lesions in the oropharynx Eyes:     General: No scleral icterus.       Right eye: No discharge.        Left eye: No discharge.     Conjunctiva/sclera: Conjunctivae normal.  Neck:     Trachea: No tracheal deviation.  Cardiovascular:     Rate and Rhythm: Normal rate.  Pulmonary:     Effort: Pulmonary effort is normal. No respiratory distress.     Breath sounds: No stridor.  Abdominal:     General: There is no distension.  Musculoskeletal:  General: No swelling or deformity.     Cervical back: Neck supple.  Skin:    General: Skin is warm and dry.     Findings: Rash present.     Comments: Patient has erythema and edema of the skin below his chin.  There is some also superficial breakdown of the epithelial surface.  Neurological:     Mental Status: He is alert. Mental status is at baseline.     Cranial Nerves: No dysarthria or facial asymmetry.     Motor: No seizure activity.     (all labs ordered are listed, but only abnormal results are displayed) Labs Reviewed - No data to display  EKG: None  Radiology: No results found.   Procedures   Medications Ordered in the ED  diphenhydrAMINE  (BENADRYL ) capsule 25 mg (has no administration in time range)  predniSONE  (DELTASONE ) tablet 50 mg (has no administration in time range)                                     Medical Decision Making Risk Prescription drug management.   Patient's reaction is consistent with chemical burn/contact dermatitis from the hair dye.  Will wash the skin with mild cleanser and water to make sure there is no residual dye.  Will treat the patient with antihistamines and steroid.     Final diagnoses:  Irritant contact dermatitis due to cosmetics    ED Discharge Orders          Ordered    diphenhydrAMINE  (BENADRYL ) 25 MG tablet  Every 6 hours PRN        03/05/24 1654    predniSONE  (DELTASONE ) 10 MG tablet  Q breakfast        03/05/24 1654               Randol Simmonds, MD 03/05/24 1655

## 2024-03-08 ENCOUNTER — Emergency Department (HOSPITAL_BASED_OUTPATIENT_CLINIC_OR_DEPARTMENT_OTHER)
Admission: EM | Admit: 2024-03-08 | Discharge: 2024-03-08 | Disposition: A | Discharge status: Home / Self Care | Payer: MEDICAID | Attending: Emergency Medicine | Admitting: Emergency Medicine

## 2024-03-08 ENCOUNTER — Encounter (HOSPITAL_BASED_OUTPATIENT_CLINIC_OR_DEPARTMENT_OTHER): Payer: Self-pay

## 2024-03-08 ENCOUNTER — Other Ambulatory Visit: Payer: Self-pay

## 2024-03-08 DIAGNOSIS — F17210 Nicotine dependence, cigarettes, uncomplicated: Secondary | ICD-10-CM | POA: Insufficient documentation

## 2024-03-08 DIAGNOSIS — R21 Rash and other nonspecific skin eruption: Secondary | ICD-10-CM | POA: Diagnosis present

## 2024-03-08 DIAGNOSIS — Z5189 Encounter for other specified aftercare: Secondary | ICD-10-CM | POA: Diagnosis not present

## 2024-03-08 MED ORDER — MUPIROCIN 2 % EX OINT: 1.0000 | TOPICAL_OINTMENT | Freq: Two times a day (BID) | CUTANEOUS | 0 refills | Status: AC

## 2024-03-08 MED ORDER — CEFADROXIL 500 MG PO CAPS: 500.0000 mg | ORAL_CAPSULE | Freq: Two times a day (BID) | ORAL | 0 refills | Status: AC

## 2024-03-08 NOTE — ED Provider Notes (Signed)
 [place] EMERGENCY DEPARTMENT AT [facility] Provider Note   CSN: [id number] Arrival date & time: 03/08/24  1244     Patient presents with: Wound Check (chin)   [name] is a 38 y.o. male.    Wound Check   38 year old male presents emergency department with concern for wound on chin.  Was seen a few days ago in the emergency department and was diagnosed with contact dermatitis after trying to put hair dye on his chin.  Reported a burning sensation and had a rash present.  Was sent home with steroid cream, Benadryl  for itching sensation which she states was initially helping.  States last night, the area became more itchy in nature.  States that he scratched off the scab and has since noticed yellow crusted area at the bottom of his chin.  States that it is more painful but has been in the past couple of days.  Denies any fevers, chills.  Presents emergency department for further assessment.  Past medical history significant for iron  deficiency anemia, GERD, achalasia, gastroparesis, schizoaffective disorder, seizure  Prior to Admission medications   Medication Sig Start Date End Date Taking? Authorizing Provider  cefadroxil  (DURICEF) 500 MG capsule Take 1 capsule (500 mg total) by mouth 2 (two) times daily. 03/08/24  Yes Silver Fell A, PA  mupirocin  ointment (BACTROBAN ) 2 % Apply 1 Application topically 2 (two) times daily. 03/08/24  Yes Silver Fell A, PA  cholecalciferol (VITAMIN D3) 25 MCG (1000 UNIT) tablet Take 1,000 Units by mouth daily.    [provider]  diphenhydrAMINE  (BENADRYL ) 25 MG tablet Take 1 tablet (25 mg total) by mouth every 6 (six) hours as needed. 03/05/24   Randol Simmonds, MD  FLUoxetine (PROZAC) 40 MG capsule Take 40 mg by mouth every morning. 07/21/20   [provider]  fluPHENAZine (PROLIXIN) 1 MG tablet Take 1 mg by mouth daily.    [provider]  iron  polysaccharides (NIFEREX) 150 MG capsule Take 1 capsule (150 mg  total) by mouth daily. 12/25/23   Avram Lupita BRAVO, MD  omeprazole  (PRILOSEC) 40 MG capsule Take 1 capsule (40 mg total) by mouth 2 (two) times daily. 01/15/24   Avram Lupita BRAVO, MD  Oxcarbazepine (TRILEPTAL) 300 MG tablet Take 300 mg by mouth 2 (two) times daily.    [provider]  predniSONE  (DELTASONE ) 10 MG tablet Take 5 tablets (50 mg total) by mouth daily with breakfast for 2 days, THEN 4 tablets (40 mg total) daily with breakfast for 2 days, THEN 3 tablets (30 mg total) daily with breakfast for 2 days, THEN 2 tablets (20 mg total) daily with breakfast for 2 days, THEN 1 tablet (10 mg total) daily with breakfast for 2 days. 03/05/24 03/15/24  Randol Simmonds, MD  QUEtiapine  (SEROQUEL ) 400 MG tablet Take 400 mg by mouth 2 (two) times daily.    [provider]    Allergies: Patient has no known allergies.    Review of Systems  All other systems reviewed and are negative.   Updated Vital Signs BP 129/86   Pulse 93   Temp 97.9 F (36.6 C) (Temporal)   Resp 20   Ht 5' 8 (1.727 m)   Wt 72.6 kg   SpO2 100%   BMI 24.34 kg/m   Physical Exam Vitals and nursing note reviewed.  Constitutional:      General: He is not in acute distress.    Appearance: He is well-developed.  HENT:     Head:  Normocephalic and atraumatic.  Eyes:     Conjunctiva/sclera: Conjunctivae normal.  Cardiovascular:     Rate and Rhythm: Normal rate and regular rhythm.     Heart sounds: No murmur heard. Pulmonary:     Effort: Pulmonary effort is normal. No respiratory distress.     Breath sounds: Normal breath sounds.  Abdominal:     Palpations: Abdomen is soft.     Tenderness: There is no abdominal tenderness.  Musculoskeletal:        General: No swelling.     Cervical back: Neck supple.  Skin:    General: Skin is warm and dry.     Capillary Refill: Capillary refill takes less than 2 seconds.     Comments: Area of skin depigmentation on chin.  Excoriations present.  Honey crusted lesion at the  most inferior aspect of rash.  Mild induration/erythema on the borders of the rash.  No oropharyngeal involvement.  Neurological:     Mental Status: He is alert.  Psychiatric:        Mood and Affect: Mood normal.     (all labs ordered are listed, but only abnormal results are displayed) Labs Reviewed - No data to display  EKG: None  Radiology: No results found.   Procedures   Medications Ordered in the ED - No data to display                                  Medical Decision Making Risk Prescription drug management.   This patient presents to the ED for concern of rash, this involves an extensive number of treatment options, and is a complaint that carries with it a high risk of complications and morbidity.  The differential diagnosis includes cellulitis, erysipelas, impetigo, necrotizing infection, abscess, contact dermatitis, sjs/ten, other   Co morbidities that complicate the patient evaluation  See HPI   Additional history obtained:  Additional history obtained from EMR External records from outside source obtained and reviewed including hospital records   Lab Tests:  N/a   Imaging Studies ordered:  N/a   Cardiac Monitoring: / EKG:  N/a   Consultations Obtained:  N/a   Problem List / ED Course / Critical interventions / Medication management  Rash Reevaluation of the patient showed that the patient stayed the same I have reviewed the patients home medicines and have made adjustments as needed   Social Determinants of Health:  Cigarette use.  Denies illicit drug use.   Test / Admission - Considered:  Rash, wound recheck Vitals signs within normal range and stable throughout visit. 37 year old male presents emergency department with concern for wound on chin.  Was seen a few days ago in the emergency department and was diagnosed with contact dermatitis after trying to put hair dye on his chin.  Reported a burning sensation and had a  rash present.  Was sent home with steroid cream, Benadryl  for itching sensation which she states was initially helping.  States last night, the area became more itchy in nature.  States that he scratched off the scab and has since noticed yellow crusted area at the bottom of his chin.  States that it is more painful but has been in the past couple of days.  Denies any fevers, chills.  Presents emergency department for further assessment. On exam, area of skin depigmentation on chin with excoriations present.  Area of honey crusted lesions inferior aspect of wound  with mild induration/erythematous skin changes around honey crusted lesion.  Clinical concern for impetigo possible cellulitis as well.  Will treat empirically with topical antibiotics as well as oral antibiotics and recommend additional symptomatic therapy as an AVS.  Patient does not meet SIRS criteria.  No evidence of abscess clinically.  Will recommend close follow-up with primary care in the outpatient setting.  Treatment plan discussed with patient he is understanding was agreeable.  Patient well-appearing, afebrile in no acute distress. Worrisome signs and symptoms were discussed with the patient, and the patient acknowledged understanding to return to the ED if noticed. Patient was stable upon discharge.       Final diagnoses:  Encounter for wound re-check    ED Discharge Orders          Ordered    mupirocin  ointment (BACTROBAN ) 2 %  2 times daily        03/08/24 1429    cefadroxil  (DURICEF) 500 MG capsule  2 times daily        03/08/24 1429               Silver Wonda LABOR, [place] 03/08/24 1436    Bari Roxie HERO, DO 03/09/24 [phone]

## 2024-03-08 NOTE — Discharge Instructions (Addendum)
 As discussed, it appears that the wound on your chin looks slightly infected.  Recommend placing antibiotic ointment over the area as well as we will send in antibiotics by mouth if the wound is not responding well to the topical antibiotics at first.  You may continue to take Benadryl  for any itching sensation.  If this makes you very drowsy, you may switch to a nondrowsy allergy medicine such as Zyrtec/Claritin /Allegra for any itchy sensation.  Recommend close follow-up primary care for reassessment.  Please not hesitate to return to emergency department if the worrisome signs and symptoms we discussed become apparent.

## 2024-03-08 NOTE — ED Triage Notes (Signed)
 Arrives POV with complaints of worsening pain to chin. Patient states that he had an allergic reaction to hair dye a few days ago and he was seen here for treatment. Now he has developed a scab to his chin that he pulled off. Site is now irritated and painful.

## 2024-03-08 NOTE — ED Notes (Signed)
 Reviewed AVS/discharge instruction with patient. Time allotted for and all questions answered. Patient is agreeable for d/c and escorted to ed exit by staff.

## 2024-03-12 ENCOUNTER — Ambulatory Visit (INDEPENDENT_AMBULATORY_CARE_PROVIDER_SITE_OTHER): Payer: MEDICAID | Admitting: Internal Medicine

## 2024-03-12 ENCOUNTER — Other Ambulatory Visit (INDEPENDENT_AMBULATORY_CARE_PROVIDER_SITE_OTHER): Payer: MEDICAID

## 2024-03-12 ENCOUNTER — Ambulatory Visit: Payer: Self-pay | Admitting: Internal Medicine

## 2024-03-12 ENCOUNTER — Ambulatory Visit: Payer: MEDICAID | Admitting: Internal Medicine

## 2024-03-12 ENCOUNTER — Encounter: Payer: Self-pay | Admitting: Internal Medicine

## 2024-03-12 ENCOUNTER — Other Ambulatory Visit: Payer: Self-pay

## 2024-03-12 VITALS — BP 100/50 | HR 93 | Ht 71.0 in | Wt 147.1 lb

## 2024-03-12 DIAGNOSIS — R634 Abnormal weight loss: Secondary | ICD-10-CM

## 2024-03-12 DIAGNOSIS — K219 Gastro-esophageal reflux disease without esophagitis: Secondary | ICD-10-CM

## 2024-03-12 DIAGNOSIS — R1110 Vomiting, unspecified: Secondary | ICD-10-CM

## 2024-03-12 DIAGNOSIS — K220 Achalasia of cardia: Secondary | ICD-10-CM

## 2024-03-12 DIAGNOSIS — D500 Iron deficiency anemia secondary to blood loss (chronic): Secondary | ICD-10-CM

## 2024-03-12 DIAGNOSIS — R1319 Other dysphagia: Secondary | ICD-10-CM

## 2024-03-12 DIAGNOSIS — K3184 Gastroparesis: Secondary | ICD-10-CM

## 2024-03-12 DIAGNOSIS — Z8639 Personal history of other endocrine, nutritional and metabolic disease: Secondary | ICD-10-CM

## 2024-03-12 DIAGNOSIS — K222 Esophageal obstruction: Secondary | ICD-10-CM

## 2024-03-12 LAB — CBC WITH DIFFERENTIAL/PLATELET
Basophils Absolute: 0 K/uL (ref 0.0–0.1)
Basophils Relative: 0.2 % (ref 0.0–3.0)
Eosinophils Absolute: 0.2 K/uL (ref 0.0–0.7)
Eosinophils Relative: 2 % (ref 0.0–5.0)
HCT: 37 % — ABNORMAL LOW (ref 39.0–52.0)
Hemoglobin: 12.3 g/dL — ABNORMAL LOW (ref 13.0–17.0)
Lymphocytes Relative: 18.2 % (ref 12.0–46.0)
Lymphs Abs: 1.4 K/uL (ref 0.7–4.0)
MCHC: 33.2 g/dL (ref 30.0–36.0)
MCV: 85.1 fl (ref 78.0–100.0)
Monocytes Absolute: 0.7 K/uL (ref 0.1–1.0)
Monocytes Relative: 8.6 % (ref 3.0–12.0)
Neutro Abs: 5.5 K/uL (ref 1.4–7.7)
Neutrophils Relative %: 71 % (ref 43.0–77.0)
Platelets: 387 K/uL (ref 150.0–400.0)
RBC: 4.35 Mil/uL (ref 4.22–5.81)
RDW: 15.1 % (ref 11.5–15.5)
WBC: 7.8 K/uL (ref 4.0–10.5)

## 2024-03-12 LAB — COMPREHENSIVE METABOLIC PANEL WITH GFR
ALT: 12 U/L (ref 0–53)
AST: 12 U/L (ref 0–37)
Albumin: 4 g/dL (ref 3.5–5.2)
Alkaline Phosphatase: 36 U/L — ABNORMAL LOW (ref 39–117)
BUN: 11 mg/dL (ref 6–23)
CO2: 29 meq/L (ref 19–32)
Calcium: 9.2 mg/dL (ref 8.4–10.5)
Chloride: 103 meq/L (ref 96–112)
Creatinine, Ser: 0.97 mg/dL (ref 0.40–1.50)
GFR: 97.72 mL/min (ref >60.00)
Glucose, Bld: 114 mg/dL — ABNORMAL HIGH (ref 70–99)
Potassium: 3 meq/L — ABNORMAL LOW (ref 3.5–5.1)
Sodium: 142 meq/L (ref 135–145)
Total Bilirubin: 0.3 mg/dL (ref 0.2–1.2)
Total Protein: 6.7 g/dL (ref 6.0–8.3)

## 2024-03-12 LAB — FERRITIN: Ferritin: 19.1 ng/mL — ABNORMAL LOW (ref 22.0–322.0)

## 2024-03-12 MED ORDER — METOCLOPRAMIDE HCL 10 MG PO TABS: 10.0000 mg | ORAL_TABLET | Freq: Three times a day (TID) | ORAL | 1 refills | Status: DC

## 2024-03-12 MED ORDER — POTASSIUM CHLORIDE CRYS ER 20 MEQ PO TBCR: 20.0000 meq | EXTENDED_RELEASE_TABLET | Freq: Every day | ORAL | 1 refills | Status: AC

## 2024-03-12 NOTE — Progress Notes (Signed)
 [name] 38 y.o. June 14, 1986 [id number]  Assessment & Plan:   Encounter Diagnoses  Name Primary?   ACHALASIA Yes   GERD with stricture, status post Heller myotomy    Iron  deficiency anemia due to chronic blood loss    Gastroparesis, suspected, question vagal nerve injury    Loss of weight    Schedule EGD with possible esophageal dilation to evaluate dysphagia regurgitation and follow-up achalasia.The risks and benefits as well as alternatives of endoscopic procedure(s) have been discussed and reviewed. All questions answered. The patient agrees to proceed.  Clear liquid diet 24 hours prior to EGD.   Restart Reglan  and monitor for side effects, consider domperidone if possible.  Labs today, CBC, CMET and ferritin  I appreciate the opportunity to care for this patient.    Subjective:  Gastrointestinal problems summary:   Achalasia-diagnosed 2008 and Heller myotomy and Dor fundoplication September 2008 Dr. Adelia, redo 2017 suspected associated delayed gastric emptying/gastroparesis question vagal nerve injury treated with metoclopramide    GERD with stricture-developed after achalasia surgery, treated with PPI and dilations, last done 20 mm dilation August 2021   Iron  deficiency anemia -question chronic blood loss from GERD, 2017 workup with negative colonoscopy and EGD though subsequent EGDs have shown chronic inflammation.  Followed in hematology with intermittent parenteral iron .    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  He was last seen in April.  At that time I discontinued Reglan  because I was concerned about potential interaction with his antipsychotics particularly Prolixin.  Psychiatry did not think we could change  things.  He reports regurgitation and vomiting occurring daily or every other day, especially after discontinuing Reglan . He describes difficulty with food passage, often stating that food 'won't go down' and 'comes back up,' significantly impacting his nutritional intake.  He has lost weight and primarily consumes soups, such as broccoli cheddar cheese and lentil soup, as his main meals. These foods provide comfort and satiety throughout the day.  He experiences heartburn, although less frequently than regurgitation and vomiting. Heartburn occurs occasionally but is not the primary concern.  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    Wt Readings from Last 3 Encounters:  03/12/24 147 lb 2 oz (66.7 kg)  03/08/24 160 lb 0.9 oz (72.6 kg)  03/05/24 160 lb (72.6 kg)     No Known Allergies Current Meds  Medication Sig   cefadroxil  (DURICEF) 500 MG capsule Take 1 capsule (500 mg total) by mouth 2 (two) times daily.   cholecalciferol (VITAMIN D3) 25 MCG (1000 UNIT) tablet Take 1,000 Units by mouth daily.   diphenhydrAMINE  (BENADRYL ) 25 MG tablet Take 1 tablet (25 mg total) by mouth every 6 (six) hours as needed.   fenofibrate (TRICOR) 145 MG tablet Take 145 mg by mouth daily.   FLUoxetine (PROZAC) 40 MG capsule Take 40 mg by mouth every morning.   fluPHENAZine (PROLIXIN) 1 MG tablet Take 1 mg by mouth daily.   iron  polysaccharides (NIFEREX) 150 MG capsule Take 1 capsule (150 mg total) by mouth daily.   mupirocin  ointment (BACTROBAN ) 2 % Apply 1 Application topically 2 (two) times daily.   omeprazole  (PRILOSEC) 40 MG capsule Take 1 capsule (40 mg total) by mouth  2 (two) times daily.   Oxcarbazepine (TRILEPTAL) 300 MG tablet Take 300 mg by mouth 2 (two) times daily.   predniSONE  (DELTASONE ) 10 MG tablet Take 5 tablets (50 mg total) by mouth daily with breakfast for 2 days, THEN 4  tablets (40 mg total) daily with breakfast for 2 days, THEN 3 tablets (30 mg total) daily with breakfast for 2 days, THEN 2 tablets (20 mg total) daily with breakfast for 2 days, THEN 1 tablet (10 mg total) daily with breakfast for 2 days.   QUEtiapine  (SEROQUEL ) 400 MG tablet Take 400 mg by mouth 2 (two) times daily.   Vitamin D, Ergocalciferol, (DRISDOL) 1.25 MG (50000 UNIT) CAPS capsule Take 50,000 Units by mouth once a week.   Past Medical History:  Diagnosis Date   Achalasia of esophagus    Anemia    Blood transfusion without reported diagnosis    Depression    Gastroparesis, suspected, question vagal nerve injury 03/25/2019   GERD with stricture    Schizoaffective disorder    Seizures (HCC)    Last seizure 4-5 yrs ago - caused by Wellbutrin    Past Surgical History:  Procedure Laterality Date   BALLOON DILATION  02/12/2012   Procedure: BALLOON DILATION;  Surgeon: Lupita FORBES Commander, MD;  Location: [facility] ENDOSCOPY;  Service: Endoscopy;  Laterality: N/A;   COLONOSCOPY N/A 10/06/2015   Procedure: COLONOSCOPY;  Surgeon: Elspeth Deward Naval, MD;  Location: [facility] ENDOSCOPY;  Service: Gastroenterology;  Laterality: N/A;   COLONOSCOPY     ESOPHAGOGASTRODUODENOSCOPY N/A 10/06/2015   Procedure: ESOPHAGOGASTRODUODENOSCOPY (EGD);  Surgeon: Elspeth Deward Naval, MD;  Location: [facility] ENDOSCOPY;  Service: Gastroenterology;  Laterality: N/A;   ESOPHAGOGASTRODUODENOSCOPY (EGD) WITH ESOPHAGEAL DILATION  08/22/12   esophagram  08/31/2006   HELLER MYOTOMY  02/2007 AND 11/2015   with Dor Fundoplication- Dr. 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   Review of Systems   Objective:   Physical  Exam @BP  (!) 100/50   Pulse 93   Ht 5' 11 (1.803 m)   Wt 147 lb 2 oz (66.7 kg)   BMI 20.52 kg/m @  General:  NAD Eyes:   anicteric Lungs:  clear Heart::  S1S2 no rubs, murmurs or gallops Abdomen:  soft and nontender, BS+ Ext:   no edema, cyanosis or clubbing    Data Reviewed:  See HPI

## 2024-03-12 NOTE — Patient Instructions (Addendum)
 It has been recommended to you by your physician that you have a(n) EGD completed. Per your request, we did not schedule the procedure(s) today. Please contact our office at [phone] should you decide to have the procedure completed. You will be scheduled for a pre-visit and procedure at that time.  Your provider has requested that you go to the basement level for lab work before leaving today. Press B on the elevator. The lab is located at the first door on the left as you exit the elevator.   We have sent the following medications to your pharmacy for you to pick up at your convenience: Reglan    Due to recent changes in healthcare laws, you may see the results of your imaging and laboratory studies on MyChart before your provider has had a chance to review them.  We understand that in some cases there may be results that are confusing or concerning to you. Not all laboratory results come back in the same time frame and the provider may be waiting for multiple results in order to interpret others.  Please give us  48 hours in order for your provider to thoroughly review all the results before contacting the office for clarification of your results.   _______________________________________________________  If your blood pressure at your visit was 140/90 or greater, please contact your primary care physician to follow up on this.  _______________________________________________________  If you are age 37 or older, your body mass index should be between 23-30. Your Body mass index is 20.52 kg/m. If this is out of the aforementioned range listed, please consider follow up with your Primary Care Provider.  If you are age 65 or younger, your body mass index should be between 19-25. Your Body mass index is 20.52 kg/m. If this is out of the aformentioned range listed, please consider follow up with your Primary Care Provider.   ________________________________________________________  The  [facility] providers would like to encourage you to use MYCHART to communicate with providers for non-urgent requests or questions.  Due to long hold times on the telephone, sending your provider a message by [facility] may be a faster and more efficient way to get a response.  Please allow 48 business hours for a response.  Please remember that this is for non-urgent requests.  _______________________________________________________  [facility] is using a team-based approach to care.  Your team is made up of your doctor and two to three APPS. Our APPS (Nurse Practitioners and Physician Assistants) work with your physician to ensure care continuity for you. They are fully qualified to address your health concerns and develop a treatment plan. They communicate directly with your gastroenterologist to care for you. Seeing the Advanced Practice Practitioners on your physician's team can help you by facilitating care more promptly, often allowing for earlier appointments, access to diagnostic testing, procedures, and other specialty referrals.   Thank you for choosing me and  Gastroenterology.  Dr.Carl Avram

## 2024-04-03 ENCOUNTER — Other Ambulatory Visit: Payer: Self-pay | Admitting: Internal Medicine

## 2024-04-03 NOTE — Telephone Encounter (Signed)
 Is 90 day supply okay Sir? Thank you.

## 2024-04-03 NOTE — Telephone Encounter (Signed)
 He is on the schedule to see Harlene on November 5.  The way I left that was she was to call back to schedule an EGD.  Can you find out why is seeing her as opposed to scheduling the EGD.  He has been somewhat reluctant but then was having dysphagia again and I thought he needed that.  We could set that up without an office visit.  He does need clear liquids 24 hours before.   Regarding the potassium I only prescribed 30 days worth because he was low for the first time.  He should come back and recheck that level with a BMP before we prescribe it again.

## 2024-04-04 ENCOUNTER — Other Ambulatory Visit: Payer: MEDICAID

## 2024-04-04 ENCOUNTER — Telehealth: Payer: Self-pay

## 2024-04-04 DIAGNOSIS — K220 Achalasia of cardia: Secondary | ICD-10-CM

## 2024-04-04 DIAGNOSIS — Z8639 Personal history of other endocrine, nutritional and metabolic disease: Secondary | ICD-10-CM

## 2024-04-04 DIAGNOSIS — R1310 Dysphagia, unspecified: Secondary | ICD-10-CM

## 2024-04-04 LAB — BASIC METABOLIC PANEL WITH GFR
BUN: 7 mg/dL (ref 6–23)
CO2: 30 meq/L (ref 19–32)
Calcium: 9.5 mg/dL (ref 8.4–10.5)
Chloride: 104 meq/L (ref 96–112)
Creatinine, Ser: 0.93 mg/dL (ref 0.40–1.50)
GFR: 102.74 mL/min (ref >60.00)
Glucose, Bld: 105 mg/dL — ABNORMAL HIGH (ref 70–99)
Potassium: 3.9 meq/L (ref 3.5–5.1)
Sodium: 140 meq/L (ref 135–145)

## 2024-04-04 NOTE — Telephone Encounter (Signed)
 Avram Lupita BRAVO, MD    04/03/24  3:58 PM Note He is on the schedule to see Harlene on November 5.  The way I left that was she was to call back to schedule an EGD.  Can you find out why is seeing her as opposed to scheduling the EGD.  He has been somewhat reluctant but then was having dysphagia again and I thought he needed that.  We could set that up without an office visit.  He does need clear liquids 24 hours before.     Regarding the potassium I only prescribed 30 days worth because he was low for the first time.  He should come back and recheck that level with a BMP before we prescribe it again.      I spoke with Ozell and he set up an EGD. He request I mail him the instructions, confirmed address.  He has done an EGD before. I told him we are adjusting his instructions with clear liquids 24 hours before. I hand wrote this on his instructions.  I told him we will not refill his Potassium at this time.   He is aware he needs to come get blood drawn so we can see if he needs the Potassium rx.

## 2024-04-07 ENCOUNTER — Ambulatory Visit: Payer: Self-pay | Admitting: Internal Medicine

## 2024-04-07 NOTE — Telephone Encounter (Signed)
 Let [name] know his potassium level is normal so no need to keep taking potassium (pharmacy requested a refill and we checked it)  Also - HE NEEDS TO GO ON CLEAR LIQUIDS FOR THE WHOLE DAY PRIOR TO HIS EGD DUE TO HAVING ACHALASIA

## 2024-04-09 ENCOUNTER — Inpatient Hospital Stay (HOSPITAL_BASED_OUTPATIENT_CLINIC_OR_DEPARTMENT_OTHER): Payer: MEDICAID | Admitting: Hematology

## 2024-04-09 ENCOUNTER — Other Ambulatory Visit: Payer: Self-pay

## 2024-04-09 ENCOUNTER — Inpatient Hospital Stay: Payer: MEDICAID | Attending: Hematology

## 2024-04-09 VITALS — BP 123/76 | HR 91 | Temp 98.1°F | Resp 20 | Wt 158.6 lb

## 2024-04-09 DIAGNOSIS — F259 Schizoaffective disorder, unspecified: Secondary | ICD-10-CM | POA: Insufficient documentation

## 2024-04-09 DIAGNOSIS — D500 Iron deficiency anemia secondary to blood loss (chronic): Secondary | ICD-10-CM

## 2024-04-09 DIAGNOSIS — F17290 Nicotine dependence, other tobacco product, uncomplicated: Secondary | ICD-10-CM | POA: Insufficient documentation

## 2024-04-09 DIAGNOSIS — K3184 Gastroparesis: Secondary | ICD-10-CM | POA: Insufficient documentation

## 2024-04-09 DIAGNOSIS — K5900 Constipation, unspecified: Secondary | ICD-10-CM | POA: Diagnosis not present

## 2024-04-09 DIAGNOSIS — Z833 Family history of diabetes mellitus: Secondary | ICD-10-CM | POA: Insufficient documentation

## 2024-04-09 DIAGNOSIS — R21 Rash and other nonspecific skin eruption: Secondary | ICD-10-CM | POA: Diagnosis not present

## 2024-04-09 DIAGNOSIS — F32A Depression, unspecified: Secondary | ICD-10-CM | POA: Diagnosis not present

## 2024-04-09 DIAGNOSIS — D509 Iron deficiency anemia, unspecified: Secondary | ICD-10-CM | POA: Insufficient documentation

## 2024-04-09 DIAGNOSIS — L239 Allergic contact dermatitis, unspecified cause: Secondary | ICD-10-CM | POA: Insufficient documentation

## 2024-04-09 DIAGNOSIS — K219 Gastro-esophageal reflux disease without esophagitis: Secondary | ICD-10-CM | POA: Diagnosis not present

## 2024-04-09 DIAGNOSIS — Z79899 Other long term (current) drug therapy: Secondary | ICD-10-CM | POA: Diagnosis not present

## 2024-04-09 DIAGNOSIS — K220 Achalasia of cardia: Secondary | ICD-10-CM | POA: Diagnosis not present

## 2024-04-09 LAB — CBC WITH DIFFERENTIAL (CANCER CENTER ONLY)
Abs Immature Granulocytes: 0.08 K/uL — ABNORMAL HIGH (ref 0.00–0.07)
Basophils Absolute: 0 K/uL (ref 0.0–0.1)
Basophils Relative: 0 %
Eosinophils Absolute: 0.3 K/uL (ref 0.0–0.5)
Eosinophils Relative: 4 %
HCT: 33.3 % — ABNORMAL LOW (ref 39.0–52.0)
Hemoglobin: 11.4 g/dL — ABNORMAL LOW (ref 13.0–17.0)
Immature Granulocytes: 1 %
Lymphocytes Relative: 19 %
Lymphs Abs: 1.3 K/uL (ref 0.7–4.0)
MCH: 28.1 pg (ref 26.0–34.0)
MCHC: 34.2 g/dL (ref 30.0–36.0)
MCV: 82.2 fL (ref 80.0–100.0)
Monocytes Absolute: 0.9 K/uL (ref 0.1–1.0)
Monocytes Relative: 13 %
Neutro Abs: 4.2 K/uL (ref 1.7–7.7)
Neutrophils Relative %: 63 %
Platelet Count: 354 K/uL (ref 150–400)
RBC: 4.05 MIL/uL — ABNORMAL LOW (ref 4.22–5.81)
RDW: 15.5 % (ref 11.5–15.5)
WBC Count: 6.7 K/uL (ref 4.0–10.5)
nRBC: 0 % (ref 0.0–0.2)

## 2024-04-09 LAB — CMP (CANCER CENTER ONLY)
ALT: 18 U/L (ref 0–44)
AST: 16 U/L (ref 15–41)
Albumin: 4 g/dL (ref 3.5–5.0)
Alkaline Phosphatase: 45 U/L (ref 38–126)
Anion gap: 3 — ABNORMAL LOW (ref 5–15)
BUN: 11 mg/dL (ref 6–20)
CO2: 30 mmol/L (ref 22–32)
Calcium: 9.7 mg/dL (ref 8.9–10.3)
Chloride: 107 mmol/L (ref 98–111)
Creatinine: 0.89 mg/dL (ref 0.61–1.24)
GFR, Estimated: >60 mL/min (ref >60)
Glucose, Bld: 75 mg/dL (ref 70–99)
Potassium: 4 mmol/L (ref 3.5–5.1)
Sodium: 140 mmol/L (ref 135–145)
Total Bilirubin: 0.2 mg/dL (ref 0.0–1.2)
Total Protein: 6.8 g/dL (ref 6.5–8.1)

## 2024-04-09 LAB — IRON AND IRON BINDING CAPACITY (CC-WL,HP ONLY)
Iron: 95 ug/dL (ref 45–182)
Saturation Ratios: 20 % (ref 17.9–39.5)
TIBC: 465 ug/dL — ABNORMAL HIGH (ref 250–450)
UIBC: 370 ug/dL (ref 117–376)

## 2024-04-09 LAB — FERRITIN: Ferritin: 53 ng/mL (ref 24–336)

## 2024-04-09 MED ORDER — TRIAMCINOLONE ACETONIDE 0.1 % EX OINT: 1.0000 | TOPICAL_OINTMENT | Freq: Two times a day (BID) | CUTANEOUS | 0 refills | Status: AC

## 2024-04-09 MED ORDER — CLOBETASOL PROPIONATE 0.05 % EX LOTN: 1.0000 | TOPICAL_LOTION | Freq: Two times a day (BID) | CUTANEOUS | 0 refills | Status: AC

## 2024-04-09 NOTE — Progress Notes (Signed)
 HEMATOLOGY/ONCOLOGY CLINIC NOTE  Date of Service: .02/08/2022  No care team member to display  CHIEF COMPLAINTS/PURPOSE OF CONSULTATION:  Follow-up for continued evaluation and management of iron  deficiency anemia  HISTORY OF PRESENTING ILLNESS:   [name] is a wonderful 38 y.o. male who has been referred to us  by Dr. Avram, MD  for evaluation and management of iron  deficiency anemia. The pt reports that he is doing well overall.  The pt reports that he was referred by his GI for anemia over the last few years. The pt's iron  levels have been lower over the last three years. The pt reports that he has gained weight since his last endoscopy, eating a more balanced diet. The pt reports that he does not feel weak or tired. The pt has returned to his baseline of 186 pounds from being as low as 140-150 pounds. The pt reports that he experiences constipation and hard stools, and his bowel movements are only regular after drinking this tea that helps him have normal stools. The pt notes the feeling and appearance of bloating in his stomach. He notes that he feels the food stays in his stomach longer after eating, feeling very gaseous. The pt does take daily iron  pills. The pt notes he does take the Reglan  TID before his meals.   The pt notes that he currently eats a yogurt at 5 am before work, lunch at 4 pm, and snacks throughout day if needed.  The pt denies any history of allergies to medications.  On review of systems, pt reports abdominal bloating, constipation and denies fatigue, bloody/black stools, abdominal pain, back pain, fevers, chills, night sweats, flank pain, problems passing urine, and any other symptoms.  INTERVAL HISTORY:  [name] is a 38 y.o. male who is here for continued evaluation and management of iron  deficiency anemia.    He was last seen by me on 02/08/2022:  He does note improvement in his energy levels after his IV iron  but notes that his energy  levels are now again starting to dwindle. No overt evidence of GI bleeding noted by the patient. Labs done today were discussed with him in detail.  Today, he continues to follow with Dr. Avram for management of his gastroparesis and other GI issues. He says that recently he has been experiencing dysphagia, and so his diet usually consists of soft foods, such as soups and yogurt, as solids tend to become stuck. Additionally, he has noticed that if he takes medications after eating he becomes nauseous and sometimes vomits. He notes that he has undergone stricture dilation before and on 04/29/2024 he will be seeing Dr. Avram for another EGD.  Regarding his anemia, he denies noticing any bleeding issues (nose/gum bleeds or melena/hematochezia) Says he does take PO Iron , which he tolerates.   Additionally, he reports onset of a rash after he tried dying his facial hair - he has had reactions to this dye previously - and is currently managing with Benadryl . Denies other sensitivity reactions, such as swelling.   MEDICAL HISTORY:  Past Medical History:  Diagnosis Date   Achalasia of esophagus    Anemia    Blood transfusion without reported diagnosis    Depression    Gastroparesis, suspected, question vagal nerve injury 03/25/2019   GERD with stricture    Schizoaffective disorder    Seizures (HCC)    Last seizure 4-5 yrs ago - caused by Wellbutrin     SURGICAL HISTORY: Past Surgical History:  Procedure  Laterality Date   BALLOON DILATION  02/12/2012   Procedure: BALLOON DILATION;  Surgeon: Lupita FORBES Commander, MD;  Location: [facility] ENDOSCOPY;  Service: Endoscopy;  Laterality: N/A;   COLONOSCOPY N/A 10/06/2015   Procedure: COLONOSCOPY;  Surgeon: Elspeth Deward Naval, MD;  Location: [facility] ENDOSCOPY;  Service: Gastroenterology;  Laterality: N/A;   COLONOSCOPY     ESOPHAGOGASTRODUODENOSCOPY N/A 10/06/2015   Procedure: ESOPHAGOGASTRODUODENOSCOPY (EGD);  Surgeon: Elspeth Deward Naval, MD;  Location: [facility]  ENDOSCOPY;  Service: Gastroenterology;  Laterality: N/A;   ESOPHAGOGASTRODUODENOSCOPY (EGD) WITH ESOPHAGEAL DILATION  08/22/12   esophagram  08/31/2006   HELLER MYOTOMY  02/2007 AND 11/2015   with Dor Fundoplication- Dr. Marlyse [facility]) twice   PANENDOSCOPY  11/08/2006   with submucosal injection   PANENDOSCOPY  03/09/2006   normal   UPPER GASTROINTESTINAL ENDOSCOPY      SOCIAL HISTORY: Social History   Socioeconomic History   Marital status: Single    Spouse name: Not on file   Number of children: 0   Years of education: Not on file   Highest education level: Not on file  Occupational History   Occupation: [employer]  Tobacco Use   Smoking status: Former    Current packs/day: 0.50    Average packs/day: 0.5 packs/day for 2.0 years (1.0 ttl pk-yrs)    Types: E-cigarettes, Cigarettes   Smokeless tobacco: Never   Tobacco comments:    given counseling sheet 01-26-12, pt started e-sig  Vaping Use   Vaping status: Every Day   Start date: 10/24/2013   Substances: Nicotine  Substance and Sexual Activity   Alcohol use: No   Drug use: No   Sexual activity: Not on file  Other Topics Concern   Not on file  Social History Narrative   Single   Lives w/ mom   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, [place]    Social Drivers of Health   Financial Resource Strain: Not on file  Food Insecurity: Not on file  Transportation Needs: Not on file  Physical Activity: Not on file  Stress: Not on file  Social Connections: Unknown (12/03/2022)   Received from [facility]   Social Network    Social Network: Not on file  Intimate Partner Violence: Unknown (12/03/2022)   Received from [facility]   HITS    Physically Hurt: Not on file    Insult or Talk Down To: Not on file    Threaten Physical Harm: Not on file    Scream or Curse: Not on file    FAMILY HISTORY: Family History  Problem Relation Age of Onset   Diabetes Father    Colon cancer Neg Hx    Stomach  cancer Neg Hx    Pancreatic cancer Neg Hx    Rectal cancer Neg Hx    Heart disease Neg Hx    Colon polyps Neg Hx     ALLERGIES:  has no known allergies.  MEDICATIONS:  Current Outpatient Medications  Medication Sig Dispense Refill   Clobetasol Propionate 0.05 % lotion Apply 1 Application topically 2 (two) times daily for 10 days. To rash over head/scalp 59 mL 0   triamcinolone ointment (KENALOG) 0.1 % Apply 1 Application topically 2 (two) times daily for 15 days. To severe rash over the face from hair dye 30 g 0   cefadroxil  (DURICEF) 500 MG capsule Take 1 capsule (500 mg total) by mouth 2 (two) times daily. 10 capsule 0   cholecalciferol (VITAMIN D3) 25 MCG (1000 UNIT) tablet Take  1,000 Units by mouth daily.     diphenhydrAMINE  (BENADRYL ) 25 MG tablet Take 1 tablet (25 mg total) by mouth every 6 (six) hours as needed. 30 tablet 0   fenofibrate (TRICOR) 145 MG tablet Take 145 mg by mouth daily.     FLUoxetine (PROZAC) 40 MG capsule Take 40 mg by mouth every morning.     fluPHENAZine (PROLIXIN) 1 MG tablet Take 1 mg by mouth daily.     iron  polysaccharides (NIFEREX) 150 MG capsule Take 1 capsule (150 mg total) by mouth daily. 30 capsule 5   metoCLOPramide  (REGLAN ) 10 MG tablet Take 1 tablet (10 mg total) by mouth 4 (four) times daily -  before meals and at bedtime. 120 tablet 1   mupirocin  ointment (BACTROBAN ) 2 % Apply 1 Application topically 2 (two) times daily. 22 g 0   omeprazole  (PRILOSEC) 40 MG capsule Take 1 capsule (40 mg total) by mouth 2 (two) times daily. 60 capsule 5   Oxcarbazepine (TRILEPTAL) 300 MG tablet Take 300 mg by mouth 2 (two) times daily.     potassium chloride  SA (KLOR-CON  M) 20 MEQ tablet Take 1 tablet (20 mEq total) by mouth daily. 30 tablet 1   QUEtiapine  (SEROQUEL ) 400 MG tablet Take 400 mg by mouth 2 (two) times daily.     Vitamin D, Ergocalciferol, (DRISDOL) 1.25 MG (50000 UNIT) CAPS capsule Take 50,000 Units by mouth once a week.     No current  facility-administered medications for this visit.    REVIEW OF SYSTEMS:   10 Point review of Systems was done is negative except as noted above.  PHYSICAL EXAMINATION: ECOG PERFORMANCE STATUS: 1 - Symptomatic but completely ambulatory  Vitals:   04/09/24 1420  BP: 123/76  Pulse: 91  Resp: 20  Temp: 98.1 F (36.7 C)  SpO2: 99%   Filed Weights   04/09/24 1420  Weight: 158 lb 9.6 oz (71.9 kg)  Body mass index is 22.12 kg/m.  NAD GENERAL:alert, in no acute distress and comfortable SKIN: (+) acute rash on face and scalp, no significant lesions EYES: conjunctiva are pink and non-injected, sclera anicteric OROPHARYNX: MMM, no exudates, no oropharyngeal erythema or ulceration NECK: supple, no JVD LYMPH:  no palpable lymphadenopathy in the cervical, axillary or inguinal regions LUNGS: clear to auscultation b/l with normal respiratory effort HEART: regular rate & rhythm ABDOMEN:  normoactive bowel sounds , non tender, not distended. Extremity: no pedal edema PSYCH: alert & oriented x 3 with fluent speech NEURO: no focal motor/sensory deficits   LABORATORY DATA:  I have reviewed the data as listed     Latest Ref Rng & Units 04/09/2024    1:52 PM 03/12/2024    9:03 AM 02/08/2022   11:45 AM  CBC EXTENDED  WBC 4.0 - 10.5 K/uL 6.7  7.8  7.2   RBC 4.22 - 5.81 MIL/uL 4.05  4.35  4.94   Hemoglobin 13.0 - 17.0 g/dL 88.5  87.6  85.3   HCT 39.0 - 52.0 % 33.3  37.0  40.6   Platelets 150 - 400 K/uL 354  387.0  296   NEUT# 1.7 - 7.7 K/uL 4.2  5.5  5.6   Lymph# 0.7 - 4.0 K/uL 1.3  1.4  0.9        Latest Ref Rng & Units 04/09/2024    1:52 PM 04/04/2024    4:50 PM 03/12/2024    9:03 AM  CMP  Glucose 70 - 99 mg/dL 75  894  885  BUN 6 - 20 mg/dL 11  7  11    Creatinine 0.61 - 1.24 mg/dL 9.10  9.06  9.02   Sodium 135 - 145 mmol/L 140  140  142   Potassium 3.5 - 5.1 mmol/L 4.0  3.9  3.0   Chloride 98 - 111 mmol/L 107  104  103   CO2 22 - 32 mmol/L 30  30  29    Calcium 8.9 - 10.3  mg/dL 9.7  9.5  9.2   Total Protein 6.5 - 8.1 g/dL 6.8   6.7   Total Bilirubin 0.0 - 1.2 mg/dL 0.2   0.3   Alkaline Phos 38 - 126 U/L 45   36   AST 15 - 41 U/L 16   12   ALT 0 - 44 U/L 18   12    Iron  Panel Lab Results  Component Value Date   IRON  95 04/09/2024   UIBC 370 04/09/2024   TIBC 465 (H) 04/09/2024   IRONPCTSAT 20 04/09/2024   FERRITIN 53 04/09/2024    RADIOGRAPHIC STUDIES: I have personally reviewed the radiological images as listed and agreed with the findings in the report. No results found.  ASSESSMENT & PLAN:  38 yo male with   1) Iron  deficiency anemia  Likely due to chronic GI losses .  Patient has been on chronic meloxicam.  Also on chronic PPI with acid suppression potentially leading to decreased iron  absorption.  2) multiple GI issues including achalasia, GERD with stricture, gastroparesis.  3) Allergic dermatitis  Triggered by hair dye product applies to facial hair - this is a known irritant to him and has caused a rash previously. No other reactions.  PLAN: - Discussed lab results on 04/09/2024 in detail with patient: CBC showed WBC of 6.7K, Hemoglobin of 11.4 decreased from 12.3, and PLTs 354K - Iron  Panel and Ferritin reviewed independently: Iron  95 and Ferritin 53. CMP is normal.  - No indication of overt bleeding at this time. - IV feraheme  510mg  weekly x 2 doses to keep ferritin>100 - Continue vitamin B complex 1 capsule p.o. daily - Continue follow-up with Dr. Avram for management of his chronic GI issues  FOLLOW UP: IV feraheme  weekly x 2 doses at [facility] st RTC with Dr Onesimo with labs in 3 months  .The total time spent in the appointment was 30 minutes* .  All of the patient's questions were answered with apparent satisfaction. The patient knows to call the clinic with any problems, questions or concerns.   Emaline Onesimo MD MS AAHIVMS [facility] Hematology/Oncology Physician [facility]  .*Total Encounter Time as defined by  the Centers for Medicare and Medicaid Services includes, in addition to the face-to-face time of a patient visit (documented in the note above) non-face-to-face time: obtaining and reviewing outside history, ordering and reviewing medications, tests or procedures, care coordination (communications with other health care professionals or caregivers) and documentation in the medical record.  I,  Damien Lagle,acting as a scribe for Emaline Onesimo, MD.,have documented all relevant documentation on the behalf of Emaline Onesimo, MD,as directed by  Emaline Onesimo, MD while in the presence of Emaline Onesimo, MD.  I have reviewed the above documentation for accuracy and completeness, and I agree with the above. Emaline Candida Onesimo MD.

## 2024-04-15 ENCOUNTER — Encounter: Payer: Self-pay | Admitting: Hematology

## 2024-04-16 ENCOUNTER — Telehealth: Payer: Self-pay | Admitting: Pharmacy Technician

## 2024-04-16 ENCOUNTER — Other Ambulatory Visit (HOSPITAL_COMMUNITY): Payer: Self-pay | Admitting: Hematology

## 2024-04-16 NOTE — Telephone Encounter (Addendum)
 Auth Submission: NO AUTH NEEDED Site of care: [facility] Payer: [employer] Medication & CPT/J Code(s) submitted: Feraheme  (ferumoxytol ) [id number] Diagnosis Code: D50.9 Route of submission (phone, fax, portal):  Phone # Fax # Auth type: Buy/Bill HB Units/visits requested: X2 Reference number:  Approval from: 04/16/24 to 06/25/24

## 2024-04-17 ENCOUNTER — Other Ambulatory Visit: Payer: Self-pay | Admitting: Internal Medicine

## 2024-04-29 ENCOUNTER — Encounter: Payer: MEDICAID | Admitting: Internal Medicine

## 2024-04-30 ENCOUNTER — Ambulatory Visit: Payer: MEDICAID | Admitting: Gastroenterology

## 2024-05-01 ENCOUNTER — Other Ambulatory Visit: Payer: Self-pay | Admitting: Physician Assistant

## 2024-05-09 ENCOUNTER — Other Ambulatory Visit: Payer: Self-pay | Admitting: Internal Medicine

## 2024-05-14 ENCOUNTER — Encounter: Payer: Self-pay | Admitting: Hematology

## 2024-05-20 ENCOUNTER — Ambulatory Visit (INDEPENDENT_AMBULATORY_CARE_PROVIDER_SITE_OTHER): Admitting: *Deleted

## 2024-05-20 VITALS — BP 128/82 | HR 77 | Temp 97.8°F | Resp 14 | Ht 71.0 in | Wt 150.8 lb

## 2024-05-20 DIAGNOSIS — D500 Iron deficiency anemia secondary to blood loss (chronic): Secondary | ICD-10-CM | POA: Diagnosis not present

## 2024-05-20 MED ORDER — SODIUM CHLORIDE 0.9 % IV SOLN
510.0000 mg | Freq: Once | INTRAVENOUS | Status: AC
  Administered 2024-05-20: 510 mg via INTRAVENOUS
  Filled 2024-05-20: qty 17

## 2024-05-20 NOTE — Progress Notes (Signed)
 Diagnosis:  Iron  Deficiency Anemia  Provider:  Mannam, Praveen MD  Procedure: IV Infusion  IV Type: Peripheral, IV Location: R Forearm  Feraheme  Dose: 510 mg  Infusion Start Time: 1141 am  Infusion Stop Time: 1202 pm  Post Infusion IV Care: Observation period completed and Peripheral IV Discontinued  Discharge: Condition: Good, Destination: Home . AVS Provided  Performed by:  Trudy Lamarr LABOR, RN

## 2024-05-27 ENCOUNTER — Ambulatory Visit (INDEPENDENT_AMBULATORY_CARE_PROVIDER_SITE_OTHER): Payer: MEDICAID

## 2024-05-27 VITALS — BP 124/79 | HR 90 | Temp 98.4°F | Resp 18 | Ht 71.0 in | Wt 150.8 lb

## 2024-05-27 DIAGNOSIS — D500 Iron deficiency anemia secondary to blood loss (chronic): Secondary | ICD-10-CM | POA: Diagnosis not present

## 2024-05-27 MED ORDER — SODIUM CHLORIDE 0.9 % IV SOLN
510.0000 mg | Freq: Once | INTRAVENOUS | Status: AC
  Administered 2024-05-27: 510 mg via INTRAVENOUS
  Filled 2024-05-27: qty 17

## 2024-05-27 NOTE — Progress Notes (Signed)
 Diagnosis: Iron  Deficiency Anemia  Provider:  Praveen Mannam MD  Procedure: IV Infusion  IV Type: Peripheral, IV Location: R Forearm  Feraheme  (Ferumoxytol ), Dose: 510 mg  Infusion Start Time: 1208  Infusion Stop Time: 1224  Post Infusion IV Care: Observation period completed and Peripheral IV Discontinued  Discharge: Condition: Good, Destination: Home . AVS Declined  Performed by:  Emmett Arntz, RN

## 2024-07-15 ENCOUNTER — Other Ambulatory Visit: Payer: Self-pay

## 2024-07-15 DIAGNOSIS — D500 Iron deficiency anemia secondary to blood loss (chronic): Secondary | ICD-10-CM

## 2024-07-16 ENCOUNTER — Inpatient Hospital Stay (HOSPITAL_BASED_OUTPATIENT_CLINIC_OR_DEPARTMENT_OTHER): Payer: MEDICAID | Admitting: Hematology

## 2024-07-16 ENCOUNTER — Inpatient Hospital Stay: Payer: MEDICAID | Attending: Hematology

## 2024-07-16 VITALS — BP 117/78 | HR 91 | Temp 97.9°F | Resp 20 | Wt 155.1 lb

## 2024-07-16 DIAGNOSIS — D500 Iron deficiency anemia secondary to blood loss (chronic): Secondary | ICD-10-CM

## 2024-07-16 LAB — CMP (CANCER CENTER ONLY)
ALT: 13 U/L (ref 0–44)
AST: 22 U/L (ref 15–41)
Albumin: 4.4 g/dL (ref 3.5–5.0)
Alkaline Phosphatase: 46 U/L (ref 38–126)
Anion gap: 12 (ref 5–15)
BUN: 7 mg/dL (ref 6–20)
CO2: 26 mmol/L (ref 22–32)
Calcium: 9.7 mg/dL (ref 8.9–10.3)
Chloride: 104 mmol/L (ref 98–111)
Creatinine: 1.08 mg/dL (ref 0.61–1.24)
GFR, Estimated: >60 mL/min
Glucose, Bld: 113 mg/dL — ABNORMAL HIGH (ref 70–99)
Potassium: 4 mmol/L (ref 3.5–5.1)
Sodium: 141 mmol/L (ref 135–145)
Total Bilirubin: 0.3 mg/dL (ref 0.0–1.2)
Total Protein: 7.2 g/dL (ref 6.5–8.1)

## 2024-07-16 LAB — IRON AND IRON BINDING CAPACITY (CC-WL,HP ONLY)
Iron: 92 ug/dL (ref 45–182)
Saturation Ratios: 22 % (ref 17.9–39.5)
TIBC: 426 ug/dL (ref 250–450)
UIBC: 334 ug/dL

## 2024-07-16 LAB — CBC WITH DIFFERENTIAL (CANCER CENTER ONLY)
Abs Immature Granulocytes: 0.01 K/uL (ref 0.00–0.07)
Basophils Absolute: 0 K/uL (ref 0.0–0.1)
Basophils Relative: 1 %
Eosinophils Absolute: 0.5 K/uL (ref 0.0–0.5)
Eosinophils Relative: 9 %
HCT: 36.4 % — ABNORMAL LOW (ref 39.0–52.0)
Hemoglobin: 12.7 g/dL — ABNORMAL LOW (ref 13.0–17.0)
Immature Granulocytes: 0 %
Lymphocytes Relative: 25 %
Lymphs Abs: 1.4 K/uL (ref 0.7–4.0)
MCH: 28.5 pg (ref 26.0–34.0)
MCHC: 34.9 g/dL (ref 30.0–36.0)
MCV: 81.8 fL (ref 80.0–100.0)
Monocytes Absolute: 0.4 K/uL (ref 0.1–1.0)
Monocytes Relative: 8 %
Neutro Abs: 3.1 K/uL (ref 1.7–7.7)
Neutrophils Relative %: 57 %
Platelet Count: 320 K/uL (ref 150–400)
RBC: 4.45 MIL/uL (ref 4.22–5.81)
RDW: 16.5 % — ABNORMAL HIGH (ref 11.5–15.5)
WBC Count: 5.5 K/uL (ref 4.0–10.5)
nRBC: 0 % (ref 0.0–0.2)

## 2024-07-16 LAB — FERRITIN: Ferritin: 349 ng/mL — ABNORMAL HIGH (ref 24–336)

## 2024-07-16 NOTE — Progress Notes (Signed)
 " HEMATOLOGY ONCOLOGY PROGRESS NOTE  Date of service: 07/16/2024  No care team member to display  CHIEF COMPLAINT/PURPOSE OF CONSULTATION: Follow-up for continued evaluation and management of Iron  Deficiency Anemia  HISTORY OF PRESENTING ILLNESS: [name] is a wonderful 39 y.o. male who has been referred to us  by Dr. Avram, MD  for evaluation and management of iron  deficiency anemia. The pt reports that he is doing well overall.   The pt reports that he was referred by his GI for anemia over the last few years. The pt's iron  levels have been lower over the last three years. The pt reports that he has gained weight since his last endoscopy, eating a more balanced diet. The pt reports that he does not feel weak or tired. The pt has returned to his baseline of 186 pounds from being as low as 140-150 pounds. The pt reports that he experiences constipation and hard stools, and his bowel movements are only regular after drinking this tea that helps him have normal stools. The pt notes the feeling and appearance of bloating in his stomach. He notes that he feels the food stays in his stomach longer after eating, feeling very gaseous. The pt does take daily iron  pills. The pt notes he does take the Reglan  TID before his meals.    The pt notes that he currently eats a yogurt at 5 am before work, lunch at 4 pm, and snacks throughout day if needed.   The pt denies any history of allergies to medications.   On review of systems, pt reports abdominal bloating, constipation and denies fatigue, bloody/black stools, abdominal pain, back pain, fevers, chills, night sweats, flank pain, problems passing urine, and any other symptoms.  INTERVAL HISTORY:  [name] is a 39 y.o. male who is here today for continued evaluation and management of IDA. he was last seen by me on 04/09/2024; at the time he mentioned experiencing dysphagia, nausea, and vomiting, as well as a rash after dying his facial  hair.   Today, he says that he has recently been feeling improved. He has been taking his PO Iron  with apple juice instead of orange juice as the acidity exacerbates his GERD.  He notes that his diet is fairly balanced overall. He was scheduled to undergo a repeat EGD in November 2025, however he reports that his PCP is through [facility], so he also plans to transfer his GI care to [facility]. He endorses persisting issue with solid foods getting stuck while he's swallowing.  He reports that he is smoking 1 pack every other day and he does not consume alcohol.  He states that he is still seeing Psychiatry, and recently had a medication added but is unsure of what it is at this moment.   Denies any abdominal pain or bowel/urinary changes.  REVIEW OF SYSTEMS:   10 Point review of systems of done and is negative except as noted above.  MEDICAL HISTORY Past Medical History:  Diagnosis Date   Achalasia of esophagus    Anemia    Blood transfusion without reported diagnosis    Depression    Gastroparesis, suspected, question vagal nerve injury 03/25/2019   GERD with stricture    Schizoaffective disorder    Seizures (HCC)    Last seizure 4-5 yrs ago - caused by Wellbutrin     IMMUNIZATION HISTORY Immunization History  Administered Date(s) Administered   Influenza,inj,Quad PF,6+ Mos 02/25/2016, 03/20/2017   Moderna Covid-19 Fall Seasonal Vaccine 5yrs &  older 07/19/2022, 04/02/2024   Moderna SARS-COV2 Booster Vaccination 05/25/2020   Moderna Sars-Covid-2 Vaccination 10/02/2019, 11/04/2019   Pfizer(Comirnaty)Fall Seasonal Vaccine 12 years and older 03/27/2023   Tdap 03/20/2017    SURGICAL HISTORY Past Surgical History:  Procedure Laterality Date   BALLOON DILATION  02/12/2012   Procedure: BALLOON DILATION;  Surgeon: Lupita FORBES Commander, MD;  Location: [facility] END[facility]COPY;  Service: Endoscopy;  Laterality: N/A;   COLONOSCOPY N/A 10/06/2015   Procedure: COLONOSCOPY;  Surgeon: Elspeth Deward Naval, MD;  Location: [facility] ENDOSCOPY;  Service: Gastroenterology;  Laterality: N/A;   COLONOSCOPY     ESOPHAGOGASTRODUODENOSCOPY N/A 10/06/2015   Procedure: ESOPHAGOGASTRODUODENOSCOPY (EGD);  Surgeon: Elspeth Deward Naval, MD;  Location: [facility] ENDOSCOPY;  Service: Gastroenterology;  Laterality: N/A;   ESOPHAGOGASTRODUODENOSCOPY (EGD) WITH ESOPHAGEAL DILATION  08/22/12   esophagram  08/31/2006   HELLER MYOTOMY  02/2007 AND 11/2015   with Dor Fundoplication- Dr. Marlyse [facility]) twice   PANENDOSCOPY  11/08/2006   with submucosal injection   PANENDOSCOPY  03/09/2006   normal   UPPER GASTROINTESTINAL ENDOSCOPY      SOCIAL HISTORY Social History[1]  Social History   Social History Narrative   Single   Lives w/ mom   Employed at [employer], [place]     SOCIAL DRIVERS OF HEALTH SDOH Screenings   Social Connections: Unknown (12/03/2022)   Received from [facility]  Tobacco Use: Medium Risk (03/08/2024)     FAMILY HISTORY Family History  Problem Relation Age of Onset   Diabetes Father    Colon cancer Neg Hx    Stomach cancer Neg Hx    Pancreatic cancer Neg Hx    Rectal cancer Neg Hx    Heart disease Neg Hx    Colon polyps Neg Hx      ALLERGIES: has no known allergies.  MEDICATIONS  Current Outpatient Medications  Medication Sig Dispense Refill   cefadroxil  (DURICEF) 500 MG capsule Take 1 capsule (500 mg total) by mouth 2 (two) times daily. 10 capsule 0   cholecalciferol (VITAMIN D3) 25 MCG (1000 UNIT) tablet Take 1,000 Units by mouth daily.     diphenhydrAMINE  (BENADRYL ) 25 MG tablet Take 1 tablet (25 mg total) by mouth every 6 (six) hours as needed. 30 tablet 0   fenofibrate (TRICOR) 145 MG tablet Take 145 mg by mouth daily.     FLUoxetine (PROZAC) 40 MG capsule Take 40 mg by mouth every morning.     fluPHENAZine (PROLIXIN) 1 MG tablet Take 1 mg by mouth daily.     iron  polysaccharides (NIFEREX) 150 MG capsule Take 1 capsule  (150 mg total) by mouth daily. 30 capsule 5   metoCLOPramide  (REGLAN ) 10 MG tablet TAKE 1 TABLET (10 MG TOTAL) BY MOUTH 4 (FOUR) TIMES DAILY - BEFORE MEALS AND AT BEDTIME. 120 tablet 1   mupirocin  ointment (BACTROBAN ) 2 % Apply 1 Application topically 2 (two) times daily. 22 g 0   omeprazole  (PRILOSEC) 40 MG capsule TAKE 1 CAPSULE BY MOUTH TWICE A DAY 180 capsule 1   Oxcarbazepine (TRILEPTAL) 300 MG tablet Take 300 mg by mouth 2 (two) times daily.     potassium chloride  SA (KLOR-CON  M) 20 MEQ tablet Take 1 tablet (20 mEq total) by mouth daily. 30 tablet 1   QUEtiapine  (SEROQUEL ) 400 MG tablet Take 400 mg by mouth 2 (two) times daily.     Vitamin D, Ergocalciferol, (DRISDOL) 1.25 MG (50000 UNIT) CAPS capsule Take 50,000 Units by mouth once a week.  No current facility-administered medications for this visit.    PHYSICAL EXAMINATION: ECOG PERFORMANCE STATUS: 1 - Symptomatic but completely ambulatory VITALS: Vitals:   07/16/24 1340  BP: 117/78  Pulse: 91  Resp: 20  Temp: 97.9 F (36.6 C)  SpO2: 97%   Filed Weights   07/16/24 1340  Weight: 155 lb 1.6 oz (70.4 kg)   Body mass index is 21.63 kg/m.  GENERAL: alert, in no acute distress and comfortable SKIN: no acute rashes, no significant lesions EYES: conjunctiva are pink and non-injected, sclera anicteric OROPHARYNX: MMM, no exudates, no oropharyngeal erythema or ulceration NECK: supple, no JVD LYMPH:  no palpable lymphadenopathy in the cervical, axillary or inguinal regions LUNGS: clear to auscultation b/l with normal respiratory effort HEART: regular rate & rhythm ABDOMEN:  normoactive bowel sounds , non tender, not distended, no hepatosplenomegaly Extremity: no pedal edema PSYCH: alert & oriented x 3 with fluent speech NEURO: no focal motor/sensory deficits  LABORATORY DATA:   I have reviewed the data as listed     Latest Ref Rng & Units 07/16/2024    1:11 PM 04/09/2024    1:52 PM 03/12/2024    9:03 AM  CBC  EXTENDED  WBC 4.0 - 10.5 K/uL 5.5  6.7  7.8   RBC 4.22 - 5.81 MIL/uL 4.45  4.05  4.35   Hemoglobin 13.0 - 17.0 g/dL 87.2  88.5  87.6   HCT 39.0 - 52.0 % 36.4  33.3  37.0   Platelets 150 - 400 K/uL 320  354  387.0   NEUT# 1.7 - 7.7 K/uL 3.1  4.2  5.5   Lymph# 0.7 - 4.0 K/uL 1.4  1.3  1.4    IRON  STUDIES Lab Results  Component Value Date   IRON  92 07/16/2024   IRON  95 04/09/2024    Lab Results  Component Value Date   UIBC 334 07/16/2024   UIBC 370 04/09/2024    Lab Results  Component Value Date   TIBC 426 07/16/2024   TIBC 465 (H) 04/09/2024    Lab Results  Component Value Date   IRONPCTSAT 22 07/16/2024   IRONPCTSAT 20 04/09/2024    Lab Results  Component Value Date   FERRITIN 349 (H) 07/16/2024   FERRITIN 53 04/09/2024        Latest Ref Rng & Units 07/16/2024    1:11 PM 04/09/2024    1:52 PM 04/04/2024    4:50 PM  CMP  Glucose 70 - 99 mg/dL 886  75  894   BUN 6 - 20 mg/dL 7  11  7    Creatinine 0.61 - 1.24 mg/dL 8.91  9.10  9.06   Sodium 135 - 145 mmol/L 141  140  140   Potassium 3.5 - 5.1 mmol/L 4.0  4.0  3.9   Chloride 98 - 111 mmol/L 104  107  104   CO2 22 - 32 mmol/L 26  30  30    Calcium 8.9 - 10.3 mg/dL 9.7  9.7  9.5   Total Protein 6.5 - 8.1 g/dL 7.2  6.8    Total Bilirubin 0.0 - 1.2 mg/dL 0.3  0.2    Alkaline Phos 38 - 126 U/L 46  45    AST 15 - 41 U/L 22  16    ALT 0 - 44 U/L 13  18     . Lab Results  Component Value Date   IRON  92 07/16/2024   TIBC 426 07/16/2024   IRONPCTSAT 22 07/16/2024   (Iron   and TIBC)  Lab Results  Component Value Date   FERRITIN 349 (H) 07/16/2024    PREVIOUS STUDIES:  RADIOGRAPHIC STUDIES: I have personally reviewed the radiological images as listed and agreed with the findings in the report. No results found.  ASSESSMENT & PLAN:   39 y.o. male with  1) Iron  Deficiency Anemia  Likely due to chronic GI losses .  Patient has been on chronic meloxicam.  Also on chronic PPI with acid suppression potentially  leading to decreased iron  absorption. Lab Results  Component Value Date   IRON  92 07/16/2024   IRON  95 04/09/2024    Lab Results  Component Value Date   UIBC 334 07/16/2024   UIBC 370 04/09/2024    Lab Results  Component Value Date   TIBC 426 07/16/2024   TIBC 465 (H) 04/09/2024    Lab Results  Component Value Date   IRONPCTSAT 22 07/16/2024   IRONPCTSAT 20 04/09/2024    Lab Results  Component Value Date   FERRITIN 349 (H) 07/16/2024   FERRITIN 53 04/09/2024      2) multiple GI issues including achalasia, GERD with stricture, gastroparesis. - Was followed by Dr. Avram, however will be transferring GI care to [facility], where his PCP is also located.   3) Allergic dermatitis - Resolved as of 07/16/2024.             Triggered by hair dye product applies to facial hair - this is a known irritant to him and has caused a rash previously. No other reactions.  PLAN: - Discussed lab results on 07/16/2024 in detail with patient: CBC showed WBC of 5.5K, Hemoglobin of 12.7 increased from 11.4, and PLTs of 320K. CMP stable.  Iron  Panel and Ferritin: Iron % 22 and Ferritin 349, increased from 53. -no indication for IV iron  at this time. -continue PO Iron  Polysaccharide atleast 3 times a week at this time. -on PPI per GI - Discussed with him that if acid reflux is bothering him, then he doesn't need to use orange juice, but if he would like to absorb Iron  better then the he can use an ounce of orange juice with his PO Iron .   FOLLOW-UP in 6 months for labs and follow-up with Dr. Onesimo.  The total time spent in the appointment was 20 minutes* .  All of the patient's questions were answered and the patient knows to call the clinic with any problems, questions, or concerns.  Emaline Onesimo MD MS AAHIVMS [facility] Hematology/Oncology Physician [facility]  *Total Encounter Time as defined by the Centers for Medicare and Medicaid Services includes, in addition to the  face-to-face time of a patient visit (documented in the note above) non-face-to-face time: obtaining and reviewing outside history, ordering and reviewing medications, tests or procedures, care coordination (communications with other health care professionals or caregivers) and documentation in the medical record.  I,Emily Lagle,acting as a neurosurgeon for Emaline Onesimo, MD.,have documented all relevant documentation on the behalf of Emaline Onesimo, MD,as directed by  Emaline Onesimo, MD while in the presence of Emaline Onesimo, MD.  I have reviewed the above documentation for accuracy and completeness, and I agree with the above.  Emaline Onesimo, MD      [1]  Social History Tobacco Use   Smoking status: Former    Current packs/day: 0.50    Average packs/day: 0.5 packs/day for 2.0 years (1.0 ttl pk-yrs)    Types: E-cigarettes, Cigarettes   Smokeless tobacco: Never  Tobacco comments:    given counseling sheet 01-26-12, pt started e-sig  Vaping Use   Vaping status: Every Day   Start date: 10/24/2013   Substances: Nicotine  Substance Use Topics   Alcohol use: No   Drug use: No   "

## 2024-07-22 ENCOUNTER — Other Ambulatory Visit: Payer: Self-pay | Admitting: Internal Medicine

## 2024-07-26 ENCOUNTER — Encounter: Payer: Self-pay | Admitting: Hematology

## 2025-01-14 ENCOUNTER — Inpatient Hospital Stay: Payer: MEDICAID | Attending: Hematology

## 2025-01-14 ENCOUNTER — Inpatient Hospital Stay: Payer: MEDICAID | Admitting: Hematology
